# Patient Record
Sex: Female | Born: 1965
Health system: Southern US, Community
[De-identification: ages and names within clinical notes are randomized; demographics above are authoritative.]

## PROBLEM LIST (undated history)

## (undated) DIAGNOSIS — E559 Vitamin D deficiency, unspecified: Secondary | ICD-10-CM

## (undated) DIAGNOSIS — I10 Essential (primary) hypertension: Secondary | ICD-10-CM

## (undated) DIAGNOSIS — E119 Type 2 diabetes mellitus without complications: Secondary | ICD-10-CM

## (undated) DIAGNOSIS — C801 Malignant (primary) neoplasm, unspecified: Secondary | ICD-10-CM

## (undated) DIAGNOSIS — K219 Gastro-esophageal reflux disease without esophagitis: Secondary | ICD-10-CM

## (undated) DIAGNOSIS — E785 Hyperlipidemia, unspecified: Secondary | ICD-10-CM

## (undated) HISTORY — DX: Malignant (primary) neoplasm, unspecified: C80.1

## (undated) HISTORY — DX: Hyperlipidemia, unspecified: E78.5

## (undated) HISTORY — PX: COLONOSCOPY: SHX174

## (undated) HISTORY — DX: Gastro-esophageal reflux disease without esophagitis: K21.9

## (undated) HISTORY — PX: OTHER SURGICAL HISTORY: SHX169

## (undated) HISTORY — DX: Essential (primary) hypertension: I10

## (undated) HISTORY — DX: Type 2 diabetes mellitus without complications: E11.9

## (undated) HISTORY — DX: Vitamin D deficiency, unspecified: E55.9

---

## 1997-07-31 LAB — HM DIABETES EYE EXAM

## 2002-11-11 HISTORY — PX: ABDOMINAL HYSTERECTOMY: SHX81

## 2004-12-18 DIAGNOSIS — F419 Anxiety disorder, unspecified: Secondary | ICD-10-CM | POA: Insufficient documentation

## 2004-12-18 DIAGNOSIS — K219 Gastro-esophageal reflux disease without esophagitis: Secondary | ICD-10-CM | POA: Insufficient documentation

## 2004-12-18 DIAGNOSIS — F411 Generalized anxiety disorder: Secondary | ICD-10-CM | POA: Insufficient documentation

## 2005-01-23 ENCOUNTER — Ambulatory Visit: Payer: Self-pay

## 2005-04-18 ENCOUNTER — Inpatient Hospital Stay: Payer: Self-pay

## 2006-05-27 ENCOUNTER — Ambulatory Visit: Payer: Self-pay

## 2007-11-14 DIAGNOSIS — C801 Malignant (primary) neoplasm, unspecified: Secondary | ICD-10-CM

## 2007-11-14 HISTORY — DX: Malignant (primary) neoplasm, unspecified: C80.1

## 2008-01-27 DIAGNOSIS — Z720 Tobacco use: Secondary | ICD-10-CM | POA: Insufficient documentation

## 2008-02-09 DIAGNOSIS — Z8249 Family history of ischemic heart disease and other diseases of the circulatory system: Secondary | ICD-10-CM | POA: Insufficient documentation

## 2008-02-15 ENCOUNTER — Ambulatory Visit: Payer: Self-pay

## 2008-02-15 LAB — HM PAP SMEAR: HM Pap smear: NORMAL

## 2008-02-18 ENCOUNTER — Ambulatory Visit: Payer: Self-pay

## 2008-04-11 ENCOUNTER — Ambulatory Visit: Payer: Self-pay | Admitting: Radiation Oncology

## 2008-04-11 ENCOUNTER — Ambulatory Visit: Payer: Self-pay | Admitting: General Surgery

## 2008-04-13 ENCOUNTER — Ambulatory Visit: Payer: Self-pay | Admitting: General Surgery

## 2008-04-21 ENCOUNTER — Ambulatory Visit: Payer: Self-pay | Admitting: Radiation Oncology

## 2008-05-11 ENCOUNTER — Ambulatory Visit: Payer: Self-pay | Admitting: Radiation Oncology

## 2008-06-11 ENCOUNTER — Ambulatory Visit: Payer: Self-pay | Admitting: Radiation Oncology

## 2008-06-11 ENCOUNTER — Ambulatory Visit: Payer: Self-pay | Admitting: Oncology

## 2008-07-12 ENCOUNTER — Ambulatory Visit: Payer: Self-pay | Admitting: Radiation Oncology

## 2008-08-11 ENCOUNTER — Ambulatory Visit: Payer: Self-pay | Admitting: Radiation Oncology

## 2008-08-30 ENCOUNTER — Emergency Department: Payer: Self-pay | Admitting: Emergency Medicine

## 2008-11-11 HISTORY — PX: BREAST LUMPECTOMY: SHX2

## 2009-01-10 ENCOUNTER — Ambulatory Visit: Payer: Self-pay | Admitting: General Surgery

## 2009-03-11 ENCOUNTER — Ambulatory Visit: Payer: Self-pay | Admitting: Oncology

## 2009-03-15 DIAGNOSIS — E119 Type 2 diabetes mellitus without complications: Secondary | ICD-10-CM | POA: Insufficient documentation

## 2009-03-20 ENCOUNTER — Ambulatory Visit: Payer: Self-pay | Admitting: Oncology

## 2009-03-27 ENCOUNTER — Ambulatory Visit: Payer: Self-pay | Admitting: Family Medicine

## 2009-04-11 ENCOUNTER — Ambulatory Visit: Payer: Self-pay | Admitting: Oncology

## 2009-05-11 ENCOUNTER — Ambulatory Visit: Payer: Self-pay | Admitting: Oncology

## 2009-05-26 ENCOUNTER — Ambulatory Visit: Payer: Self-pay | Admitting: Obstetrics & Gynecology

## 2009-05-30 ENCOUNTER — Inpatient Hospital Stay: Payer: Self-pay | Admitting: Obstetrics & Gynecology

## 2009-07-12 ENCOUNTER — Ambulatory Visit: Payer: Self-pay | Admitting: Oncology

## 2009-08-03 ENCOUNTER — Ambulatory Visit: Payer: Self-pay | Admitting: Oncology

## 2009-08-07 DIAGNOSIS — I10 Essential (primary) hypertension: Secondary | ICD-10-CM | POA: Insufficient documentation

## 2009-08-07 DIAGNOSIS — I152 Hypertension secondary to endocrine disorders: Secondary | ICD-10-CM | POA: Insufficient documentation

## 2009-08-11 ENCOUNTER — Ambulatory Visit: Payer: Self-pay | Admitting: Oncology

## 2010-02-14 DIAGNOSIS — E559 Vitamin D deficiency, unspecified: Secondary | ICD-10-CM | POA: Insufficient documentation

## 2010-02-28 ENCOUNTER — Ambulatory Visit: Payer: Self-pay

## 2010-03-11 ENCOUNTER — Ambulatory Visit: Payer: Self-pay | Admitting: Oncology

## 2010-03-19 ENCOUNTER — Emergency Department: Payer: Self-pay | Admitting: Internal Medicine

## 2010-03-24 ENCOUNTER — Emergency Department: Payer: Self-pay | Admitting: Emergency Medicine

## 2010-03-30 ENCOUNTER — Ambulatory Visit: Payer: Self-pay | Admitting: Oncology

## 2010-04-02 ENCOUNTER — Ambulatory Visit: Payer: Self-pay | Admitting: Urology

## 2010-04-05 ENCOUNTER — Ambulatory Visit: Payer: Self-pay | Admitting: Oncology

## 2010-04-05 ENCOUNTER — Ambulatory Visit: Payer: Self-pay | Admitting: Urology

## 2010-04-11 ENCOUNTER — Ambulatory Visit: Payer: Self-pay | Admitting: Oncology

## 2010-04-16 ENCOUNTER — Ambulatory Visit: Payer: Self-pay | Admitting: Gastroenterology

## 2010-04-16 HISTORY — PX: COLONOSCOPY: SHX174

## 2010-04-16 LAB — HM COLONOSCOPY: HM Colonoscopy: NORMAL

## 2010-05-11 ENCOUNTER — Ambulatory Visit: Payer: Self-pay | Admitting: Oncology

## 2010-08-29 ENCOUNTER — Ambulatory Visit: Payer: Self-pay | Admitting: Gynecologic Oncology

## 2010-09-25 ENCOUNTER — Ambulatory Visit: Payer: Self-pay | Admitting: Oncology

## 2010-10-11 ENCOUNTER — Ambulatory Visit: Payer: Self-pay | Admitting: Oncology

## 2011-03-21 ENCOUNTER — Ambulatory Visit: Payer: Self-pay | Admitting: Family Medicine

## 2011-03-21 LAB — HM MAMMOGRAPHY: HM MAMMO: NORMAL

## 2011-05-06 ENCOUNTER — Ambulatory Visit: Payer: Self-pay | Admitting: Family Medicine

## 2012-05-18 DIAGNOSIS — Z853 Personal history of malignant neoplasm of breast: Secondary | ICD-10-CM | POA: Insufficient documentation

## 2012-05-18 DIAGNOSIS — C50919 Malignant neoplasm of unspecified site of unspecified female breast: Secondary | ICD-10-CM | POA: Insufficient documentation

## 2014-03-28 ENCOUNTER — Observation Stay: Payer: Self-pay | Admitting: Internal Medicine

## 2014-03-28 LAB — BASIC METABOLIC PANEL
ANION GAP: 9 (ref 7–16)
BUN: 14 mg/dL (ref 7–18)
Calcium, Total: 9.1 mg/dL (ref 8.5–10.1)
Chloride: 101 mmol/L (ref 98–107)
Co2: 25 mmol/L (ref 21–32)
Creatinine: 1.22 mg/dL (ref 0.60–1.30)
EGFR (African American): >60
GFR CALC NON AF AMER: 53 — AB
Glucose: 332 mg/dL — ABNORMAL HIGH (ref 65–99)
Osmolality: 284 (ref 275–301)
POTASSIUM: 4.3 mmol/L (ref 3.5–5.1)
Sodium: 135 mmol/L — ABNORMAL LOW (ref 136–145)

## 2014-03-28 LAB — HEMOGLOBIN A1C: HEMOGLOBIN A1C: 8.4 % — AB (ref 4.2–6.3)

## 2014-03-28 LAB — CK TOTAL AND CKMB (NOT AT ARMC)
CK, TOTAL: 72 U/L
CK-MB: 0.6 ng/mL (ref 0.5–3.6)

## 2014-03-28 LAB — CBC
HCT: 37.8 % (ref 35.0–47.0)
HGB: 12.4 g/dL (ref 12.0–16.0)
MCH: 26.5 pg (ref 26.0–34.0)
MCHC: 32.9 g/dL (ref 32.0–36.0)
MCV: 81 fL (ref 80–100)
Platelet: 94 10*3/uL — ABNORMAL LOW (ref 150–440)
RBC: 4.69 10*6/uL (ref 3.80–5.20)
RDW: 15.3 % — ABNORMAL HIGH (ref 11.5–14.5)
WBC: 4.9 10*3/uL (ref 3.6–11.0)

## 2014-03-28 LAB — TROPONIN I
Troponin-I: <0.02 ng/mL
Troponin-I: <0.02 ng/mL
Troponin-I: <0.02 ng/mL

## 2014-03-28 LAB — CK-MB
CK-MB: 0.7 ng/mL (ref 0.5–3.6)
CK-MB: 0.5 ng/mL (ref 0.5–3.6)

## 2014-03-28 LAB — D-DIMER(ARMC): D-Dimer: 287 ng/ml

## 2014-03-28 LAB — PRO B NATRIURETIC PEPTIDE: B-Type Natriuretic Peptide: 56 pg/mL (ref 0–125)

## 2014-03-29 DIAGNOSIS — R079 Chest pain, unspecified: Secondary | ICD-10-CM

## 2014-03-29 LAB — COMPREHENSIVE METABOLIC PANEL
ALBUMIN: 3.5 g/dL (ref 3.4–5.0)
ALK PHOS: 72 U/L
ALT: 36 U/L (ref 12–78)
ANION GAP: 4 — AB (ref 7–16)
BUN: 14 mg/dL (ref 7–18)
Bilirubin,Total: 0.3 mg/dL (ref 0.2–1.0)
CALCIUM: 8.7 mg/dL (ref 8.5–10.1)
CO2: 29 mmol/L (ref 21–32)
Chloride: 107 mmol/L (ref 98–107)
Creatinine: 1.1 mg/dL (ref 0.60–1.30)
EGFR (Non-African Amer.): >60
Glucose: 205 mg/dL — ABNORMAL HIGH (ref 65–99)
Osmolality: 286 (ref 275–301)
Potassium: 4.4 mmol/L (ref 3.5–5.1)
SGOT(AST): 30 U/L (ref 15–37)
SODIUM: 140 mmol/L (ref 136–145)
Total Protein: 6.8 g/dL (ref 6.4–8.2)

## 2014-03-29 LAB — CBC WITH DIFFERENTIAL/PLATELET
Basophil #: 0 10*3/uL (ref 0.0–0.1)
Basophil %: 0.6 %
EOS ABS: 0 10*3/uL (ref 0.0–0.7)
Eosinophil %: 0.7 %
HCT: 34.9 % — ABNORMAL LOW (ref 35.0–47.0)
HGB: 11.7 g/dL — AB (ref 12.0–16.0)
Lymphocyte #: 1.2 10*3/uL (ref 1.0–3.6)
Lymphocyte %: 30.9 %
MCH: 26.6 pg (ref 26.0–34.0)
MCHC: 33.4 g/dL (ref 32.0–36.0)
MCV: 80 fL (ref 80–100)
Monocyte #: 0.2 x10 3/mm (ref 0.2–0.9)
Monocyte %: 5.6 %
Neutrophil #: 2.5 10*3/uL (ref 1.4–6.5)
Neutrophil %: 62.2 %
PLATELETS: 87 10*3/uL — AB (ref 150–440)
RBC: 4.38 10*6/uL (ref 3.80–5.20)
RDW: 15.1 % — AB (ref 11.5–14.5)
WBC: 4 10*3/uL (ref 3.6–11.0)

## 2015-02-17 ENCOUNTER — Emergency Department
Admit: 2015-02-17 | Disposition: A | Discharge status: Home / Self Care | Payer: Self-pay | Admitting: Emergency Medicine

## 2015-02-17 LAB — URINALYSIS, COMPLETE
BACTERIA: NONE SEEN
BLOOD: NEGATIVE
Bilirubin,UR: NEGATIVE
Glucose,UR: NEGATIVE mg/dL (ref 0–75)
Ketone: NEGATIVE
Leukocyte Esterase: NEGATIVE
NITRITE: NEGATIVE
Ph: 5 (ref 4.5–8.0)
Protein: 100
SPECIFIC GRAVITY: 1.013 (ref 1.003–1.030)

## 2015-02-17 LAB — CBC WITH DIFFERENTIAL/PLATELET
BASOS ABS: 0 10*3/uL (ref 0.0–0.1)
Basophil %: 0.7 %
EOS ABS: 0 10*3/uL (ref 0.0–0.7)
Eosinophil %: 0.5 %
HCT: 36 % (ref 35.0–47.0)
HGB: 12 g/dL (ref 12.0–16.0)
Lymphocyte #: 1.1 10*3/uL (ref 1.0–3.6)
Lymphocyte %: 23.2 %
MCH: 26.8 pg (ref 26.0–34.0)
MCHC: 33.5 g/dL (ref 32.0–36.0)
MCV: 80 fL (ref 80–100)
MONO ABS: 0.2 x10 3/mm (ref 0.2–0.9)
MONOS PCT: 5.2 %
NEUTROS ABS: 3.2 10*3/uL (ref 1.4–6.5)
Neutrophil %: 70.4 %
PLATELETS: 112 10*3/uL — AB (ref 150–440)
RBC: 4.48 10*6/uL (ref 3.80–5.20)
RDW: 15.6 % — ABNORMAL HIGH (ref 11.5–14.5)
WBC: 4.6 10*3/uL (ref 3.6–11.0)

## 2015-02-17 LAB — LIPASE, BLOOD: Lipase: 55 U/L — ABNORMAL HIGH

## 2015-02-17 LAB — COMPREHENSIVE METABOLIC PANEL
AST: 29 U/L
Albumin: 4.2 g/dL
Alkaline Phosphatase: 81 U/L
Anion Gap: 6 — ABNORMAL LOW (ref 7–16)
BUN: 12 mg/dL
Bilirubin,Total: 0.4 mg/dL
Calcium, Total: 9.3 mg/dL
Chloride: 102 mmol/L
Co2: 28 mmol/L
Creatinine: 0.88 mg/dL
Glucose: 139 mg/dL — ABNORMAL HIGH
Potassium: 4.1 mmol/L
SGPT (ALT): 30 U/L
Sodium: 136 mmol/L
TOTAL PROTEIN: 7.4 g/dL

## 2015-02-17 LAB — TROPONIN I: Troponin-I: <0.03 ng/mL

## 2015-03-04 NOTE — Discharge Summary (Signed)
PATIENT NAME:  Cassandra Butler, Cassandra Butler MR#:  [id number] DATE OF BIRTH:  12/01/1965  DATE OF ADMISSION:  03/28/2014 DATE OF DISCHARGE:  03/29/2014  PRESENTING COMPLAINT: Chest pain.   DISCHARGE DIAGNOSES:  1. Chest pain, resolved.  2. Hypertension.  3. Type 2 diabetes.  4. Hyperlipidemia.  5. Myoview stress test done on May 19th reported as within normal limits. Ejection fraction of 62%. There is a small fixed defect in the apical region.  6. Cardiac enzymes x 3 negative.   MEDICATIONS AT DISCHARGE:  1. Zoloft 100 mg at bedtime.  2. Aromasin 25 mg daily.  3. Metformin 1000 mg b.i.d.  4. Fenofibrate 67 mg capsule daily.  5. Lisinopril 10 mg daily.  6. Lansoprazole 30 mg delayed release p.o. daily.  7. Pravastatin 20 mg once a day at bedtime.  8. Aspirin 81 mg daily.   DIET: Low sodium, ADA, 1800 calorie diet. Regular consistency.  No concentrated sweets.   FOLLOWUP: With Mariel Sleet at [facility]. The patient advised smoking cessation.   BRIEF SUMMARY OF HOSPITAL COURSE: This patient is a 49 year old Caucasian female with history of hypertension, diabetes, ongoing tobacco abuse, came in with: 1. Chest pain. She remained chest pain-free during my evaluation. She was continued on aspirin, beta blockers, p.r.n. nitroglycerin, heparin subcutaneously. Her stress test remained negative. Cardiac enzymes x3 remained negative.  2. Type 2 diabetes. Continue metformin.  The patient advised diet and exercise 3. Hyponatremia. Received IV fluids.  4. Hypertension. Continued patient's lisinopril and metoprolol.  5. Thrombocytopenia, stable.  6. Tobacco abuse. The patient is counseled on smoking cessation.  7. Hospital stay otherwise remained stable.   CODE STATUS: The patient remained a full code.   TIME SPENT: 40 minutes.    ____________________________ Hart Rochester Posey Pronto, MD sap:dd D: 03/31/2014 15:28:01 ET T: 03/31/2014 18:47:57 ET JOB#: [id number]  cc: Iona Stay A. Posey Pronto, MD,  <Dictator> Ilda Basset MD ELECTRONICALLY SIGNED 04/03/2014 16:23

## 2015-03-04 NOTE — H&P (Signed)
PATIENT NAME:  Cassandra Butler, Cassandra Butler MR#:  [id number] DATE OF BIRTH:  12-11-1965  DATE OF ADMISSION:  03/28/2014  PRIMARY CARE PHYSICIAN: Mariel Sleet, PA-C     HISTORY OF PRESENT ILLNESS:  The patient is a 49 year old Caucasian female with history of diabetes, hyperlipidemia, history of breast carcinoma status post lumpectomy, who presents to the hospital with complaints of chest pains. According to the patient, she was doing well up until approximately a couple of weeks ago when she started having left-sided chest pain. The patient is describing her pain in the left mid chest area which is described as stabbing, intermittent pain, increasing with deep breathing, also increasing with fluid intake such as drinking fluid or eating food. She denies any radiation of pain or aggravating or relieving factors. Admits of having some nausea, some shortness of breath as well as intermittent diaphoresis. The patient's pain has been going on for a few weeks now, however, it was worse today. On arrival to the Emergency Room, she was noted to be sinus tach at 118 beats per minute. She also was noted to have ST elevation in V1, V2 as well as ST depressions in lateral leads. Hospitalist services were contacted for admission. Patient admits of having stress test done approximately 8 to 9 years ago. At that time, it was quite normal.   PAST MEDICAL HISTORY:  Significant for history of diabetes mellitus, most recent hemoglobin A1c checked was approximately 1-1/2 months ago, was found to be 6.8. History of hyperlipidemia, never diagnosed with hypertension, however, the patient tells me that she is on ACE inhibitor due to kidney disease. Remote stress test approximately 8 to 9 years ago which was unremarkable. History of breast carcinoma on the left, status post lumpectomy, radiation therapy, status post 5 years of tamoxifen therapy, now she is on Aromasin. History of hysterectomy, gastroesophageal reflux disease, anxiety, depression.  The patient admits of having 1 functional kidney. The patient had apparently a hysterectomy done approximately 5 years ago, was evaluated by a kidney specialist due to abnormal kidney function tests and was found to have 1 functioning kidney, had some urologic procedure done, questionable due to obstructed ureter; however, urologist was not able to open up the patient's ureter. No nephrectomy was performed.   PAST SURGICAL HISTORY: As above plus hysterectomy. Partial hysterectomy initially, then the patient underwent oophorectomy as well as breast cancer surgery.   ALLERGIES: None.   MEDICATIONS: The patient is on Aromasin 25 mg p.o. daily, fenofibrate 67 mg p.o. daily, lansoprazole 30 mg p.o. daily, lisinopril 10 mg p.o. daily, metformin 1 gram twice daily, pravastatin 10 mg p.o. at bedtime, Zoloft 100 mg p.o. at bedtime.   FAMILY HISTORY: The patient's mother has a stroke whenever she was 82 years old. She also had diabetes. The patient's sister died at age of 78 of coronary artery disease, diabetes mellitus, peripheral vascular disease. Brother is healthy. The patient's dad had coronary artery disease, also bypass grafting.   SOCIAL HISTORY: The patient is married and has 2 children. Smokes more than 1 pack a day for 30 years and more, drinks alcohol occasionally socially, probably 5 times a year. She works for [employer].   REVIEW OF SYSTEMS:  CONSTITUTIONAL:  Positive for fatigue and weakness for a while now, chronic; pains in his left chest for the past 2 weeks. Bifocal glasses, increased shortness of breath. Some sinus congestion as well as postnasal drip, cough as well as intermittent wheezes and some shortness of breath with yellow,  dark-looking phlegm which seems to be chronic, also painful respirations in the left side of the chest seem to be more painful whenever she takes deep breaths. She admits of having some left-sided chest pains as mentioned above, also intermittent nausea and  constipation. Denies any high fevers or chills, weight loss or gain.  EYES:  Denies any double vision, glaucoma or cataracts.   ENT: Denies any tinnitus, allergies, epistaxis, sinus pain, dentures, difficulty swallowing. RESPIRATORY:  Denies any hemoptysis, asthma, chronic obstructive pulmonary disease.  CARDIOVASCULAR: Denies any orthopnea, edema, arrhythmias, palpitations or syncope.  GASTROINTESTINAL: Denies any vomiting, diarrhea, rectal bleeding, change in bowel habits.  GENITOURINARY: Denies dysuria, hematuria, frequency, incontinence.  ENDOCRINOLOGY:  Denies any polyuria, polydipsia, nocturia, thyroid problems, heat or cold intolerance or thirst.  HEMATOLOGIC: Denies any anemia, easy bruising or swollen glands. SKIN: Denies any acne, rashes, change in moles.  MUSCULOSKELETAL: Denies arthritis, cramps, swelling, gout.  NEUROLOGIC: No numbness, epilepsy or tremor.  PSYCHIATRIC: Denies anxiety, insomnia, depression.   PHYSICAL EXAMINATION: VITAL SIGNS: On arrival to the hospital, temperature is 98, pulse was 180, respirations were 20, blood pressure 130/60, saturation was 96% on room air.  GENERAL: On arrival to the hospital, the patient is a well-developed nourished Caucasian female, obese.  HEENT: Pupils are equal and reactive to light, extraocular movements intact.  No icterus or conjunctivitis.  Has normal hearing. No pharyngeal erythema. Mucosa is moist.  NECK: No masses. Supple, nontender. Thyroid is not enlarged. No adenopathy. No JVD or carotid bruits bilaterally, full range of motion.  LUNGS: Clear to auscultation though diminished breath sounds bilaterally. A few rales as well as rhonchi were heard. A few wheezes were heard. The patient does not have labored respirations, increased effort to breathe. Not in overt respiratory distress. She has mild tenderness on chest palpation on the left side just below her scar.  CARDIOVASCULAR: S1, S2 present. Rythm is regular.  PMI not  lateralized. Chest is nontender to palpation except as mentioned above. Normal pedal pulses. No lower extremity edema, tenderness or cyanosis was noted.  ABDOMEN: Soft, nontender. Bowel sounds are present. No hepatosplenomegaly or masses were noted.  RECTAL: Deferred.  MUSCLE STRENGTH: Able to move all extremities. No cyanosis, degenerative joint disease or kyphosis. Gait is not tested.  SKIN: Did not reveal any rashes, lesions, erythema, nodularity or induration. It was warm and dry to palpation.  LYMPHATIC: No adenopathy in the cervical region.  NEUROLOGICAL: Cranial nerves grossly intact. Sensory is intact. No dysarthria or aphasia. The patient is alert, oriented to time, person and place, cooperative. Memory is good.  PSYCHIATRIC: No significant confusion, agitation or depression noted.   LABORATORY, DIAGNOSTIC AND RADIOLOGICAL DATA:  BMP showed glucose of 332, sodium 135, otherwise BMP was unremarkable. Hemoglobin A1c 8.4. Liver enzymes were not checked. Cardiac enzymes x 1 within normal limits. White blood cell count 4.9, hemoglobin 12.4, platelet count 94. D-dimer was normal at 287. EKG sinus tach at 118 beats per minute, normal axis, possible left atrial enlargement according to EKG criteria, septal infarct with QS in leads V1 and V2, age indeterminate; ST-T elevation in V1 and V2 and depressions in lateral leads, elevation by approximately 1 mm and depression by 0.5 mm. Repeat EKG done 3 hours later revealed normal sinus rhythm at 94 beats per minute, no significant acute ST-T changes were noted. Radiologic studies: Chest x-ray, PA and lateral, Mar 28, 2014, showed no cardiopulmonary disease.   ASSESSMENT AND PLAN: 1.  Chest pain. Admit the patient to  medical floor. Started on metoprolol. Continue aspirin as well as add nitroglycerin, heparin subcutaneous. We will get stress test in the morning. The patient would benefit V/Q testing as well.  She has only 1 kidney functioning.  I would not do a  CT of her chest with contrast due to 1 kidney only. Will PPIs as well.   2.  Diabetes mellitus. We will continue metformin as well as diabetic diet and sliding scale insulin. Again, hemoglobin A1c was found to be high at 8.4. The patient would benefit from addition of glipizide.  3.  Hyponatremia. We will give IV fluids, reassess sodium level in the morning.  4.  Hypertension. We will continue metoprolol, nitroglycerin and lisinopril.  5.  Thrombocytopenia, unclear etiology at this time. We will check in the morning.  6.  Tobacco abuse. We discussed cessation for approximately 5 minutes. Nicotine replacement therapy will be initiated. She is agreeable.   TIME SPENT: 50 minutes.   ____________________________ Theodoro Grist, MD rv:cs D: 03/28/2014 18:02:00 ET T: 03/28/2014 19:19:43 ET JOB#: [id number]  cc: Mariel Sleet, PA-C Theodoro Grist, MD, <Dictator>  [place] MD ELECTRONICALLY SIGNED 05/04/2014 13:19

## 2015-04-04 DIAGNOSIS — E781 Pure hyperglyceridemia: Secondary | ICD-10-CM | POA: Insufficient documentation

## 2015-04-04 DIAGNOSIS — E1169 Type 2 diabetes mellitus with other specified complication: Secondary | ICD-10-CM | POA: Insufficient documentation

## 2015-04-04 DIAGNOSIS — C50919 Malignant neoplasm of unspecified site of unspecified female breast: Secondary | ICD-10-CM | POA: Insufficient documentation

## 2015-04-04 DIAGNOSIS — E785 Hyperlipidemia, unspecified: Secondary | ICD-10-CM | POA: Insufficient documentation

## 2015-04-04 DIAGNOSIS — G4733 Obstructive sleep apnea (adult) (pediatric): Secondary | ICD-10-CM | POA: Insufficient documentation

## 2015-04-17 ENCOUNTER — Other Ambulatory Visit: Payer: Self-pay | Admitting: Family Medicine

## 2015-04-17 DIAGNOSIS — E119 Type 2 diabetes mellitus without complications: Secondary | ICD-10-CM

## 2015-04-17 MED ORDER — METFORMIN HCL 1000 MG PO TABS: 1000.0000 mg | ORAL_TABLET | Freq: Two times a day (BID) | ORAL | Status: DC

## 2015-05-02 ENCOUNTER — Ambulatory Visit: Payer: Self-pay | Admitting: Family Medicine

## 2015-06-01 ENCOUNTER — Other Ambulatory Visit: Payer: Self-pay | Admitting: Family Medicine

## 2015-07-27 ENCOUNTER — Emergency Department: Payer: 59

## 2015-07-27 ENCOUNTER — Emergency Department
Admission: EM | Admit: 2015-07-27 | Discharge: 2015-07-27 | Disposition: A | Discharge status: Home / Self Care | Payer: 59 | Attending: Emergency Medicine | Admitting: Emergency Medicine

## 2015-07-27 ENCOUNTER — Encounter: Payer: Self-pay | Admitting: Emergency Medicine

## 2015-07-27 DIAGNOSIS — N200 Calculus of kidney: Secondary | ICD-10-CM | POA: Insufficient documentation

## 2015-07-27 DIAGNOSIS — I10 Essential (primary) hypertension: Secondary | ICD-10-CM | POA: Diagnosis not present

## 2015-07-27 DIAGNOSIS — N390 Urinary tract infection, site not specified: Secondary | ICD-10-CM | POA: Diagnosis not present

## 2015-07-27 DIAGNOSIS — Z720 Tobacco use: Secondary | ICD-10-CM | POA: Diagnosis not present

## 2015-07-27 DIAGNOSIS — Z79899 Other long term (current) drug therapy: Secondary | ICD-10-CM | POA: Insufficient documentation

## 2015-07-27 DIAGNOSIS — E119 Type 2 diabetes mellitus without complications: Secondary | ICD-10-CM | POA: Insufficient documentation

## 2015-07-27 DIAGNOSIS — R109 Unspecified abdominal pain: Secondary | ICD-10-CM | POA: Diagnosis present

## 2015-07-27 LAB — URINALYSIS COMPLETE WITH MICROSCOPIC (ARMC ONLY)
Bilirubin Urine: NEGATIVE
Glucose, UA: NEGATIVE mg/dL
KETONES UR: NEGATIVE mg/dL
Nitrite: POSITIVE — AB
PH: 5 (ref 5.0–8.0)
PROTEIN: 100 mg/dL — AB
SPECIFIC GRAVITY, URINE: 1.019 (ref 1.005–1.030)

## 2015-07-27 LAB — CBC
HCT: 37.2 % (ref 35.0–47.0)
HEMOGLOBIN: 12.5 g/dL (ref 12.0–16.0)
MCH: 27.6 pg (ref 26.0–34.0)
MCHC: 33.7 g/dL (ref 32.0–36.0)
MCV: 81.8 fL (ref 80.0–100.0)
Platelets: 93 10*3/uL — ABNORMAL LOW (ref 150–440)
RBC: 4.54 MIL/uL (ref 3.80–5.20)
RDW: 15.1 % — ABNORMAL HIGH (ref 11.5–14.5)
WBC: 7.3 10*3/uL (ref 3.6–11.0)

## 2015-07-27 LAB — COMPREHENSIVE METABOLIC PANEL
ALK PHOS: 73 U/L (ref 38–126)
ALT: 27 U/L (ref 14–54)
AST: 29 U/L (ref 15–41)
Albumin: 4.3 g/dL (ref 3.5–5.0)
Anion gap: 10 (ref 5–15)
BUN: 16 mg/dL (ref 6–20)
CALCIUM: 9.9 mg/dL (ref 8.9–10.3)
CHLORIDE: 101 mmol/L (ref 101–111)
CO2: 27 mmol/L (ref 22–32)
Creatinine, Ser: 0.94 mg/dL (ref 0.44–1.00)
GFR calc non Af Amer: >60 mL/min (ref >60)
GLUCOSE: 217 mg/dL — AB (ref 65–99)
Potassium: 4 mmol/L (ref 3.5–5.1)
SODIUM: 138 mmol/L (ref 135–145)
Total Bilirubin: 0.4 mg/dL (ref 0.3–1.2)
Total Protein: 7.6 g/dL (ref 6.5–8.1)

## 2015-07-27 LAB — LIPASE, BLOOD: Lipase: 26 U/L (ref 22–51)

## 2015-07-27 MED ORDER — CIPROFLOXACIN HCL 500 MG PO TABS: 500.0000 mg | ORAL_TABLET | Freq: Two times a day (BID) | ORAL | Status: AC

## 2015-07-27 MED ORDER — FLUCONAZOLE 100 MG PO TABS: 150.0000 mg | ORAL_TABLET | Freq: Once | ORAL | Status: AC

## 2015-07-27 MED ORDER — CIPROFLOXACIN HCL 500 MG PO TABS
500.0000 mg | ORAL_TABLET | Freq: Once | ORAL | Status: AC
  Administered 2015-07-27: 500 mg via ORAL
  Filled 2015-07-27: qty 1

## 2015-07-27 NOTE — Discharge Instructions (Signed)
Kidney Stones  Kidney stones (urolithiasis) are deposits that form inside your kidneys. The intense pain is caused by the stone moving through the urinary tract. When the stone moves, the ureter goes into spasm around the stone. The stone is usually passed in the urine.   CAUSES   · A disorder that makes certain neck glands produce too much parathyroid hormone (primary hyperparathyroidism).  · A buildup of uric acid crystals, similar to gout in your joints.  · Narrowing (stricture) of the ureter.  · A kidney obstruction present at birth (congenital obstruction).  · Previous surgery on the kidney or ureters.  · Numerous kidney infections.  SYMPTOMS   · Feeling sick to your stomach (nauseous).  · Throwing up (vomiting).  · Blood in the urine (hematuria).  · Pain that usually spreads (radiates) to the groin.  · Frequency or urgency of urination.  DIAGNOSIS   · Taking a history and physical exam.  · Blood or urine tests.  · CT scan.  · Occasionally, an examination of the inside of the urinary bladder (cystoscopy) is performed.  TREATMENT   · Observation.  · Increasing your fluid intake.  · Extracorporeal shock wave lithotripsy--This is a noninvasive procedure that uses shock waves to break up kidney stones.  · Surgery may be needed if you have severe pain or persistent obstruction. There are various surgical procedures. Most of the procedures are performed with the use of small instruments. Only small incisions are needed to accommodate these instruments, so recovery time is minimized.  The size, location, and chemical composition are all important variables that will determine the proper choice of action for you. Talk to your health care provider to better understand your situation so that you will minimize the risk of injury to yourself and your kidney.   HOME CARE INSTRUCTIONS   · Drink enough water and fluids to keep your urine clear or pale yellow. This will help you to pass the stone or stone fragments.  · Strain  all urine through the provided strainer. Keep all particulate matter and stones for your health care provider to see. The stone causing the pain may be as small as a grain of salt. It is very important to use the strainer each and every time you pass your urine. The collection of your stone will allow your health care provider to analyze it and verify that a stone has actually passed. The stone analysis will often identify what you can do to reduce the incidence of recurrences.  · Only take over-the-counter or prescription medicines for pain, discomfort, or fever as directed by your health care provider.  · Make a follow-up appointment with your health care provider as directed.  · Get follow-up X-rays if required. The absence of pain does not always mean that the stone has passed. It may have only stopped moving. If the urine remains completely obstructed, it can cause loss of kidney function or even complete destruction of the kidney. It is your responsibility to make sure X-rays and follow-ups are completed. Ultrasounds of the kidney can show blockages and the status of the kidney. Ultrasounds are not associated with any radiation and can be performed easily in a matter of minutes.  SEEK MEDICAL CARE IF:  · You experience pain that is progressive and unresponsive to any pain medicine you have been prescribed.  SEEK IMMEDIATE MEDICAL CARE IF:   · Pain cannot be controlled with the prescribed medicine.  · You have a fever or   shaking chills.  · The severity or intensity of pain increases over 18 hours and is not relieved by pain medicine.  · You develop a new onset of abdominal pain.  · You feel faint or pass out.  · You are unable to urinate.  MAKE SURE YOU:   · Understand these instructions.  · Will watch your condition.  · Will get help right away if you are not doing well or get worse.  Document Released: 10/28/2005 Document Revised: 06/30/2013 Document Reviewed: 03/31/2013  ExitCare® Patient Information ©2015  [employer]. This information is not intended to replace advice given to you by your health care provider. Make sure you discuss any questions you have with your health care provider.    Urinary Tract Infection  Urinary tract infections (UTIs) can develop anywhere along your urinary tract. Your urinary tract is your body's drainage system for removing wastes and extra water. Your urinary tract includes two kidneys, two ureters, a bladder, and a urethra. Your kidneys are a pair of bean-shaped organs. Each kidney is about the size of your fist. They are located below your ribs, one on each side of your spine.  CAUSES  Infections are caused by microbes, which are microscopic organisms, including fungi, viruses, and bacteria. These organisms are so small that they can only be seen through a microscope. Bacteria are the microbes that most commonly cause UTIs.  SYMPTOMS   Symptoms of UTIs may vary by age and gender of the patient and by the location of the infection. Symptoms in young women typically include a frequent and intense urge to urinate and a painful, burning feeling in the bladder or urethra during urination. Older women and men are more likely to be tired, shaky, and weak and have muscle aches and abdominal pain. A fever may mean the infection is in your kidneys. Other symptoms of a kidney infection include pain in your back or sides below the ribs, nausea, and vomiting.  DIAGNOSIS  To diagnose a UTI, your caregiver will ask you about your symptoms. Your caregiver also will ask to provide a urine sample. The urine sample will be tested for bacteria and white blood cells. White blood cells are made by your body to help fight infection.  TREATMENT   Typically, UTIs can be treated with medication. Because most UTIs are caused by a bacterial infection, they usually can be treated with the use of antibiotics. The choice of antibiotic and length of treatment depend on your symptoms and the type of bacteria causing  your infection.  HOME CARE INSTRUCTIONS  · If you were prescribed antibiotics, take them exactly as your caregiver instructs you. Finish the medication even if you feel better after you have only taken some of the medication.  · Drink enough water and fluids to keep your urine clear or pale yellow.  · Avoid caffeine, tea, and carbonated beverages. They tend to irritate your bladder.  · Empty your bladder often. Avoid holding urine for long periods of time.  · Empty your bladder before and after sexual intercourse.  · After a bowel movement, women should cleanse from front to back. Use each tissue only once.  SEEK MEDICAL CARE IF:   · You have back pain.  · You develop a fever.  · Your symptoms do not begin to resolve within 3 days.  SEEK IMMEDIATE MEDICAL CARE IF:   · You have severe back pain or lower abdominal pain.  · You develop chills.  · You   have nausea or vomiting.  · You have continued burning or discomfort with urination.  MAKE SURE YOU:   · Understand these instructions.  · Will watch your condition.  · Will get help right away if you are not doing well or get worse.  Document Released: 08/07/2005 Document Revised: 04/28/2012 Document Reviewed: 12/06/2011  ExitCare® Patient Information ©2015 [employer]. This information is not intended to replace advice given to you by your health care provider. Make sure you discuss any questions you have with your health care provider.

## 2015-07-27 NOTE — ED Notes (Signed)
C/o RUQ abd. Pain that radiates to right mid back area, +nausea and diarrhea, states she was told recently that she had high triglycerides and that she was at risk for pancreatitis

## 2015-07-27 NOTE — ED Provider Notes (Signed)
[facility] Emergency Department Provider Note  ____________________________________________  Time seen: 7:45 PM  I have reviewed the triage vital signs and the nursing notes.   HISTORY  Chief Complaint Abdominal Pain      HPI Cassandra Butler is a 49 y.o. female presents with right flank pain that radiates to the back times one day. Patient also admits to nausea but no vomiting. Patient states that she took 2 Profen at home with improvement of pain however pain returned at 4:00 PM this afternoon at which time she took further ibuprofen with pain resolution at this time. In addition patient admits to dysuria and hematuria     Past Medical History  Diagnosis Date  . Diabetes mellitus without complication   . GERD (gastroesophageal reflux disease)   . Hyperlipidemia   . Hypertension   . Cancer     breast   . Vitamin D deficiency     Patient Active Problem List   Diagnosis Date Noted  . Breast CA 04/04/2015  . HLD (hyperlipidemia) 04/04/2015  . Hypertriglyceridemia 04/04/2015  . Obstructive sleep apnea of adult 04/04/2015  . Avitaminosis D 02/14/2010  . Essential (primary) hypertension 08/07/2009  . Diabetes 03/15/2009  . Family history of cardiovascular disease 02/09/2008  . Current tobacco use 01/27/2008  . Anxiety disorder 12/18/2004  . Acid reflux 12/18/2004    Past Surgical History  Procedure Laterality Date  . Abdominal hysterectomy  2004    total, due to fibriods. Also had ovaries removed after 1 year of breast cancer.     Current Outpatient Rx  Name  Route  Sig  Dispense  Refill  . exemestane (AROMASIN) 25 MG tablet   Oral   Take by mouth daily.         Marland Kitchen glipiZIDE (GLUCOTROL) 5 MG tablet   Oral   Take by mouth daily.         Marland Kitchen lisinopril (PRINIVIL,ZESTRIL) 10 MG tablet      TAKE 1 TABLET BY MOUTH EVERY DAY   90 tablet   0   . metFORMIN (GLUCOPHAGE) 1000 MG tablet   Oral   Take 1 tablet (1,000 mg total) by mouth  2 (two) times daily.   180 tablet   1   . omeprazole (PRILOSEC) 20 MG capsule      TAKE ONE CAPSULE BY MOUTH EVERY DAY   90 capsule   0   . Omeprazole 20 MG TBEC   Oral   Take by mouth daily.         . pravastatin (PRAVACHOL) 80 MG tablet   Oral   Take by mouth daily.         . sertraline (ZOLOFT) 100 MG tablet   Oral   Take by mouth daily.           Allergies Review of patient's allergies indicates no known allergies.  No family history on file.  Social History Social History  Substance Use Topics  . Smoking status: Current Every Day Smoker -- 1.00 packs/day    Types: Cigarettes  . Smokeless tobacco: None  . Alcohol Use: No    Review of Systems  Constitutional: Negative for fever. Eyes: Negative for visual changes. ENT: Negative for sore throat. Cardiovascular: Negative for chest pain. Respiratory: Negative for shortness of breath. Gastrointestinal: Positive for right flank and nausea Genitourinary: Negative for dysuria. Musculoskeletal: Negative for back pain. Skin: Negative for rash. Neurological: Negative for headaches, focal weakness or numbness.    10-point  ROS otherwise negative.  ____________________________________________   PHYSICAL EXAM:  VITAL SIGNS: ED Triage Vitals  Enc Vitals Group     BP 07/27/15 1700 110/62 mmHg     Pulse Rate 07/27/15 1700 93     Resp --      Temp 07/27/15 1700 98.2 F (36.8 C)     Temp Source 07/27/15 1700 Oral     SpO2 07/27/15 1700 98 %     Weight 07/27/15 1700 180 lb (81.647 kg)     Height 07/27/15 1700 5\' 6"  (1.676 m)     Head Cir --      Peak Flow --      Pain Score 07/27/15 1700 5     Pain Loc --      Pain Edu? --      Excl. in [place]? --     Constitutional: Alert and oriented. Well appearing and in no distress. Eyes: Conjunctivae are normal. PERRL. Normal extraocular movements. ENT   Head: Normocephalic and atraumatic.   Nose: No congestion/rhinnorhea.   Mouth/Throat: Mucous  membranes are moist.   Neck: No stridor. Cardiovascular: Normal rate, regular rhythm. Normal and symmetric distal pulses are present in all extremities. No murmurs, rubs, or gallops. Respiratory: Normal respiratory effort without tachypnea nor retractions. Breath sounds are clear and equal bilaterally. No wheezes/rales/rhonchi. Gastrointestinal: Soft and nontender. No distention. There is no CVA tenderness. Genitourinary: deferred Musculoskeletal: Nontender with normal range of motion in all extremities. No joint effusions.  No lower extremity tenderness nor edema. Neurologic:  Normal speech and language. No gross focal neurologic deficits are appreciated. Speech is normal.  Skin:  Skin is warm, dry and intact. No rash noted. Psychiatric: Mood and affect are normal. Speech and behavior are normal. Patient exhibits appropriate insight and judgment.  ____________________________________________    LABS (pertinent positives/negatives)  Labs Reviewed  COMPREHENSIVE METABOLIC PANEL - Abnormal; Notable for the following:    Glucose, Bld 217 (*)    All other components within normal limits  CBC - Abnormal; Notable for the following:    RDW 15.1 (*)    Platelets 93 (*)    All other components within normal limits  URINALYSIS COMPLETEWITH MICROSCOPIC ([facility] ONLY) - Abnormal; Notable for the following:    Color, Urine YELLOW (*)    APPearance CLOUDY (*)    Hgb urine dipstick 3+ (*)    Protein, ur 100 (*)    Nitrite POSITIVE (*)    Leukocytes, UA 3+ (*)    Bacteria, UA RARE (*)    Squamous Epithelial / LPF 6-30 (*)    All other components within normal limits  LIPASE, BLOOD         RADIOLOGY     CT RENAL STONE STUDY (Final result) Result time: 07/27/15 21:05:46   Final result by Rad Results In Interface (07/27/15 21:05:46)   Narrative:   CLINICAL DATA: Right flank region pain with nausea and vomiting  EXAM: CT ABDOMEN AND PELVIS WITHOUT  CONTRAST  TECHNIQUE: Multidetector CT imaging of the abdomen and pelvis was performed following the standard protocol without oral or intravenous contrast material administration.  COMPARISON: Mar 25, 2010  FINDINGS: Lung bases are clear. There are small coronary artery calcifications.  Liver is enlarged measuring 24.1 cm in length. There is hepatic steatosis. No focal liver lesions are identified on this noncontrast enhanced study. Gallbladder wall is not thickened. There is no biliary duct dilatation.  Spleen remains enlarged, measuring 16.3 x 15.3 x 9.1 cm. No focal splenic  lesions are identified. No varices appreciated.  Pancreas and adrenals appear normal.  The left kidney remains diffusely atrophic. There is marked hydronephrosis on the left. There is a calculus bearing within hydronephrosis on the left measuring 6 x 5 mm. There is a calculus in the left ureter at the level of L4-5 measuring 5 x 4 mm, best seen on axial slice 56 series 2. Ureter is not seen beyond this level. It is possible that this area represents a calculus in the distal most aspect of a markedly enlarged renal pelvis with a degree of underlying ureteropelvic junction obstruction. There is no renal mass on the left.  On the right, there is no mass or hydronephrosis. There is no right-sided renal or ureteral calculus.  In the pelvis, the urinary bladder is midline. Uterus is absent. There is no pelvic mass or pelvic fluid collection. The appendix appears normal.  No bowel obstruction. No free air or portal venous air. There is no ascites, adenopathy, or abscess in the abdomen pelvis. There is atherosclerotic change in aorta but no aneurysm. There are no blastic or lytic bone lesions.  IMPRESSION: Marked atrophy of the left kidney with marked dilatation of the collecting system. There is a calculus in the dependent portion of this dilated collecting system as well as a calculus seen at  the level of L4-5, either and the proximal ureter or along the distal aspect of a marked ureteropelvic junction obstruction. No ureter is appreciable distal to this calculus.  On the right, there is no hydronephrosis or ureteral calculus. No renal calculus on the right. A cause for the right flank pain has not been established with this study.  There is hepatic megaly and splenomegaly with hepatic steatosis. No focal liver or splenic lesions.  No bowel obstruction. No abscess. Appendix appears normal.  There are small foci of coronary artery calcification noted.   Electronically Signed By: Lowella Grip III M.D. On: 07/27/2015 21:05       INITIAL IMPRESSION / ASSESSMENT AND PLAN / ED COURSE  Pertinent labs & imaging results that were available during my care of the patient were reviewed by me and considered in my medical decision making (see chart for details).  I'm performed the patient will clinical findings including CT scan results. Patient informed me that she was told that "my left kidney does not work and the tube is twisted". Patient afebrile at this time with no leukocytosis. I will prescribe antibiotics for ciprofloxacin 500 mg for urinary tract infection. Patient discussed with Dr. Jeffie Pollock urologist on-call and I will refer patient to follow-up with. ____________________________________________   FINAL CLINICAL IMPRESSION(S) / ED DIAGNOSES  Final diagnoses:  Right flank pain  UTI (lower urinary tract infection)  Kidney stone on left side      Gregor Hams, MD 07/27/15 2131

## 2015-08-07 ENCOUNTER — Telehealth: Payer: Self-pay | Admitting: Family Medicine

## 2015-08-07 NOTE — Telephone Encounter (Signed)
New patient appointment scheduled/MW

## 2015-08-07 NOTE — Telephone Encounter (Signed)
New patient request, please review. KW

## 2015-08-07 NOTE — Telephone Encounter (Signed)
Approval for new patient

## 2015-08-07 NOTE — Telephone Encounter (Signed)
Pt is asking if you will accept her daughter,  Gevena Mart 08/14/1984 as a new patient.  Pt has been being seen at [facility] and she is wanting a new PCP.  No Rx.  [employer].  Pt is having problems with anxiety.  CB# for Pamala Hurry [phone]/GB

## 2015-08-07 NOTE — Telephone Encounter (Signed)
[place] for 45 minute appointment to get established and work on her presenting problem.

## 2015-08-30 ENCOUNTER — Other Ambulatory Visit: Payer: Self-pay | Admitting: Family Medicine

## 2015-09-19 ENCOUNTER — Other Ambulatory Visit: Payer: Self-pay | Admitting: Family Medicine

## 2015-10-01 ENCOUNTER — Other Ambulatory Visit: Payer: Self-pay | Admitting: Family Medicine

## 2015-10-17 ENCOUNTER — Other Ambulatory Visit: Payer: Self-pay | Admitting: Family Medicine

## 2015-12-02 ENCOUNTER — Other Ambulatory Visit: Payer: Self-pay | Admitting: Family Medicine

## 2015-12-04 ENCOUNTER — Other Ambulatory Visit: Payer: Self-pay | Admitting: Family Medicine

## 2015-12-11 ENCOUNTER — Other Ambulatory Visit: Payer: Self-pay | Admitting: Family Medicine

## 2015-12-11 ENCOUNTER — Telehealth: Payer: Self-pay | Admitting: Family Medicine

## 2015-12-11 NOTE — Telephone Encounter (Signed)
Spoke with patient on phone and advised as below, patient is out of town but will schedule appt in Feb. KW

## 2015-12-11 NOTE — Telephone Encounter (Signed)
Does patient need OV? Please advise. Thanks!

## 2015-12-11 NOTE — Telephone Encounter (Signed)
Need to f/u on your chronic medical problems. We can discuss smoking cessation at that time.

## 2015-12-11 NOTE — Telephone Encounter (Signed)
Pt called wanting to start chantix.  Could someone please call her back.  Pt [phone]  Thanks Con Memos

## 2016-01-02 ENCOUNTER — Other Ambulatory Visit: Payer: Self-pay | Admitting: Family Medicine

## 2016-01-02 ENCOUNTER — Ambulatory Visit: Payer: Self-pay | Admitting: Family Medicine

## 2016-01-09 ENCOUNTER — Ambulatory Visit: Payer: Self-pay | Admitting: Family Medicine

## 2016-01-23 ENCOUNTER — Other Ambulatory Visit: Payer: Self-pay | Admitting: Family Medicine

## 2016-01-26 ENCOUNTER — Other Ambulatory Visit: Payer: Self-pay | Admitting: Family Medicine

## 2016-01-26 ENCOUNTER — Telehealth: Payer: Self-pay | Admitting: Family Medicine

## 2016-01-26 NOTE — Telephone Encounter (Signed)
Pt called about her Metformin prescription.  I advised her she needed to make an appointment before any refills can be sent in.  At first she did agree to make an appointment; but then stated she needed her "Metformin in the next five minutes" because she "Is at the pharmacy now and will be going out of town within the hour."  I advised her that I would sent a message back but I could not guarantee her medication would be filled in five minutes.  She then asked if it was okay to go over a week without Metformin.  I informed her that was not a question we could answer without knowing her current blood sugars and A1C.  (Her last A1C was 8.1 on 01/31/2015)  I advised her that she needs to be seen ever three months for a follow up and she told me "I know that, but my insurance has been messed up."  She then told me that she was "going to have to leave and will call around for a new provider next week."  ** Update**  While typing this message I was informed that the patient had made an appointment with you on Thursday and the pharmacist ([facility], [place]) gave her enough Metformin until then.  She states that "The Pharmacist can't believe her provider would hold diabetes medicines from her and that was not right."    Thanks,   -Mickel Baas

## 2016-02-01 ENCOUNTER — Encounter: Payer: Self-pay | Admitting: Family Medicine

## 2016-02-01 ENCOUNTER — Ambulatory Visit (INDEPENDENT_AMBULATORY_CARE_PROVIDER_SITE_OTHER): Payer: BLUE CROSS/BLUE SHIELD | Admitting: Family Medicine

## 2016-02-01 VITALS — BP 102/82 | HR 105 | Temp 98.2°F | Resp 17 | Wt 180.2 lb

## 2016-02-01 DIAGNOSIS — E1165 Type 2 diabetes mellitus with hyperglycemia: Secondary | ICD-10-CM

## 2016-02-01 DIAGNOSIS — E781 Pure hyperglyceridemia: Secondary | ICD-10-CM

## 2016-02-01 DIAGNOSIS — I10 Essential (primary) hypertension: Secondary | ICD-10-CM | POA: Diagnosis not present

## 2016-02-01 DIAGNOSIS — J0100 Acute maxillary sinusitis, unspecified: Secondary | ICD-10-CM | POA: Diagnosis not present

## 2016-02-01 LAB — POCT GLYCOSYLATED HEMOGLOBIN (HGB A1C): HEMOGLOBIN A1C: 9.1

## 2016-02-01 MED ORDER — GLIPIZIDE 5 MG PO TABS: ORAL_TABLET | ORAL | Status: DC

## 2016-02-01 MED ORDER — AMOXICILLIN-POT CLAVULANATE 875-125 MG PO TABS: 1.0000 | ORAL_TABLET | Freq: Two times a day (BID) | ORAL | Status: DC

## 2016-02-01 MED ORDER — ATORVASTATIN CALCIUM 80 MG PO TABS: 80.0000 mg | ORAL_TABLET | Freq: Every day | ORAL | Status: DC

## 2016-02-01 MED ORDER — METFORMIN HCL 1000 MG PO TABS: 1000.0000 mg | ORAL_TABLET | Freq: Two times a day (BID) | ORAL | Status: DC

## 2016-02-01 MED ORDER — BLOOD GLUCOSE MONITOR KIT: PACK | Status: DC

## 2016-02-01 NOTE — Progress Notes (Signed)
Subjective:     Patient ID: Cassandra Butler, female   DOB: 1966-02-16, 50 y.o.   MRN: [id number]  HPI  Chief Complaint  Patient presents with  . Diabetes    Patient comes in office today for follow up from 01/31/15. Patients HgbA1C in house at visit was 8.1%, patient was started on Glipizide 12m. Patient reports poor compliance and tolerance, she states that when she was on Glipizide her blood sugar dropped and d/c three days after being prescribed medication  . Hyperlipidemia    Follow up from 01/31/15 patient was advised to continue Pravastatin. Patient reports good compliance and tolerance on medication  . Hypertension    Follow up from 01/31/15, patient blood pressure in house at visit was 124/82. Patient was advised to continue Lisinopril, she reports good compliance and tolerance on medication  . Abnormal Lab    Patient reports that she had a Biometric screen through her employeer, patients Lipid panel showed a Triglycerides at 1367. Patients manager is requesting patient have lab addressed.   . Facial Pain    Patient reports sinus pain and pressure for the past 4 weeks.   Here today after a year's hiatus in f/u of her medical problems. Reports persistent sinus pressure and purulent drainage. + tobacco @ 1 ppd. Last refill for pravastatin requested and filled a lower dose for unknown reasons.   Review of Systems  Psychiatric/Behavioral:       Sertraline controlling anxiety       Objective:   Physical Exam  Constitutional: She appears well-developed and well-nourished. No distress.  Ears: T.M's intact without inflammation Sinuses: moderate maxillary and paranasal sinus tenderness Throat: no tonsillar enlargement or exudate Neck: no cervical adenopathy Lungs: clear     Assessment:    1. Type 2 diabetes mellitus with hyperglycemia, without long-term current use of insulin (HCC) - glipiZIDE (GLUCOTROL) 5 MG tablet; 1/2 pill before breakfast and supper  Dispense: 30 tablet;  Refill: 2 - metFORMIN (GLUCOPHAGE) 1000 MG tablet; Take 1 tablet (1,000 mg total) by mouth 2 (two) times daily with a meal.  Dispense: 180 tablet; Refill: 0 - POCT glycosylated hemoglobin (Hb A1C) - blood glucose meter kit and supplies KIT; Check sugar daily  Dispense: 1 each; Refill: 0  2. Hypertriglyceridemia - atorvastatin (LIPITOR) 80 MG tablet; Take 1 tablet (80 mg total) by mouth daily.  Dispense: 30 tablet; Refill: 2  3. Essential (primary) hypertension  4. Acute maxillary sinusitis, recurrence not specified - amoxicillin-clavulanate (AUGMENTIN) 875-125 MG tablet; Take 1 tablet by mouth 2 (two) times daily.  Dispense: 20 tablet; Refill: 0    Plan:    Add Mucinex D. Return in 4 weeks to check labs and f/u diabetes.

## 2016-02-01 NOTE — Patient Instructions (Addendum)
Add Mucinex D for your sinuses. At next office visit we will recheck your sugar and cholesterol.

## 2016-02-12 ENCOUNTER — Telehealth: Payer: Self-pay | Admitting: Family Medicine

## 2016-02-12 ENCOUNTER — Other Ambulatory Visit: Payer: Self-pay | Admitting: Family Medicine

## 2016-02-12 DIAGNOSIS — N760 Acute vaginitis: Secondary | ICD-10-CM

## 2016-02-12 MED ORDER — FLUCONAZOLE 150 MG PO TABS: 150.0000 mg | ORAL_TABLET | Freq: Once | ORAL | Status: DC

## 2016-02-12 NOTE — Telephone Encounter (Signed)
Pt stated that she was taking amoxicillin-clavulanate (AUGMENTIN) 875-125 MG tablet and she thinks it has caused a yeast infection. Pt stated that she has tried OTC and it helped but didn't clear it completely. Pt would like something in the form of a pill sent to [place]. Please advise. Thanks TNP

## 2016-02-12 NOTE — Telephone Encounter (Signed)
Patient has been advised. KW 

## 2016-02-12 NOTE — Telephone Encounter (Signed)
Please review. KW 

## 2016-02-12 NOTE — Telephone Encounter (Signed)
Diflucan sent in

## 2016-02-29 ENCOUNTER — Encounter: Payer: Self-pay | Admitting: Family Medicine

## 2016-02-29 ENCOUNTER — Ambulatory Visit (INDEPENDENT_AMBULATORY_CARE_PROVIDER_SITE_OTHER): Payer: BLUE CROSS/BLUE SHIELD | Admitting: Family Medicine

## 2016-02-29 VITALS — BP 122/64 | HR 66 | Temp 98.4°F | Resp 16 | Wt 175.8 lb

## 2016-02-29 DIAGNOSIS — E781 Pure hyperglyceridemia: Secondary | ICD-10-CM | POA: Diagnosis not present

## 2016-02-29 DIAGNOSIS — E1165 Type 2 diabetes mellitus with hyperglycemia: Secondary | ICD-10-CM | POA: Diagnosis not present

## 2016-02-29 MED ORDER — GLIPIZIDE 5 MG PO TABS: ORAL_TABLET | ORAL | Status: DC

## 2016-02-29 NOTE — Patient Instructions (Signed)
We will call you with the lab results. You may increase your glipizide to 10 mg. To achieve fasting sugars  < 140

## 2016-02-29 NOTE — Progress Notes (Signed)
Subjective:     Patient ID: Cassandra Butler, female   DOB: 1966-03-22, 50 y.o.   MRN: [id number]  HPI  Chief Complaint  Patient presents with  . Diabetes    Patient is present in office for 4 week follow up, last office visit was 02/01/16. Patient was advised at last visit to continue Glipizide 5mg  and Metformin 1000mg , in house HgbA1C was 9.1%. Patient reports that her fasting blood sugars have been ranging from 212-240, lowest reading was 149. Patient reports good compliance and tolerance on medication, she denies any hyperglycemia incidents.   She has increased PM glipizide to 5 mg.but fasting sugar remains above 200. Reports tolerance of atorvastatin. States she has been cutting out junk food when she watches tv.   Review of Systems  Constitutional:       Sees her oncologist annually  Respiratory: Positive for shortness of breath (attributed ot smoking).        Not using C-Pap as insurance would not cover  Cardiovascular: Negative for chest pain and palpitations.       Objective:   Physical Exam  Constitutional: She appears well-developed and well-nourished. No distress.  Cardiovascular: Normal rate and regular rhythm.   Pulmonary/Chest: Breath sounds normal.  Musculoskeletal: She exhibits no edema (of lower extremities).       Assessment:    1. Hypertriglyceridemia - Lipid panel  2. Type 2 diabetes mellitus with hyperglycemia, without long-term current use of insulin (HCC) - glipiZIDE (GLUCOTROL) 5 MG tablet; one pill before breakfast and supper  Dispense: 60 tablet; Refill: 2 - Comprehensive metabolic panel    Plan:    She may increase glipizide as needed to 10 mg.prior to next appointment. We will call her with lab results.

## 2016-03-01 ENCOUNTER — Other Ambulatory Visit: Payer: Self-pay | Admitting: Family Medicine

## 2016-03-01 ENCOUNTER — Telehealth: Payer: Self-pay

## 2016-03-01 LAB — COMPREHENSIVE METABOLIC PANEL
A/G RATIO: 1.6 (ref 1.2–2.2)
ALT: 32 IU/L (ref 0–32)
AST: 27 IU/L (ref 0–40)
Albumin: 4.4 g/dL (ref 3.5–5.5)
Alkaline Phosphatase: 108 IU/L (ref 39–117)
BILIRUBIN TOTAL: 0.4 mg/dL (ref 0.0–1.2)
BUN / CREAT RATIO: 13 (ref 9–23)
BUN: 10 mg/dL (ref 6–24)
CHLORIDE: 101 mmol/L (ref 96–106)
CO2: 23 mmol/L (ref 18–29)
Calcium: 9.6 mg/dL (ref 8.7–10.2)
Creatinine, Ser: 0.8 mg/dL (ref 0.57–1.00)
GFR calc non Af Amer: 87 mL/min/{1.73_m2} (ref >59)
GFR, EST AFRICAN AMERICAN: 101 mL/min/{1.73_m2} (ref >59)
GLOBULIN, TOTAL: 2.7 g/dL (ref 1.5–4.5)
Glucose: 209 mg/dL — ABNORMAL HIGH (ref 65–99)
POTASSIUM: 4.6 mmol/L (ref 3.5–5.2)
SODIUM: 141 mmol/L (ref 134–144)
TOTAL PROTEIN: 7.1 g/dL (ref 6.0–8.5)

## 2016-03-01 LAB — LIPID PANEL
CHOL/HDL RATIO: 4.8 ratio — AB (ref 0.0–4.4)
Cholesterol, Total: 129 mg/dL (ref 100–199)
HDL: 27 mg/dL — AB (ref >39)
Triglycerides: 437 mg/dL — ABNORMAL HIGH (ref 0–149)

## 2016-03-01 NOTE — Telephone Encounter (Signed)
Pt informed and voiced understanding of results. 

## 2016-03-01 NOTE — Telephone Encounter (Signed)
[id number] 

## 2016-03-01 NOTE — Telephone Encounter (Signed)
-----   Message from Carmon Ginsberg, [place] sent at 03/01/2016  7:42 AM EDT ----- Labs look good (except for sugar as you know)  With triglycerides at 435. Continue atorvastatin.

## 2016-03-06 ENCOUNTER — Other Ambulatory Visit: Payer: Self-pay | Admitting: Family Medicine

## 2016-05-21 ENCOUNTER — Other Ambulatory Visit: Payer: Self-pay | Admitting: Family Medicine

## 2016-05-30 ENCOUNTER — Ambulatory Visit: Payer: BLUE CROSS/BLUE SHIELD | Admitting: Family Medicine

## 2016-06-01 ENCOUNTER — Other Ambulatory Visit: Payer: Self-pay | Admitting: Family Medicine

## 2016-07-03 DIAGNOSIS — E119 Type 2 diabetes mellitus without complications: Secondary | ICD-10-CM | POA: Diagnosis not present

## 2016-07-08 ENCOUNTER — Other Ambulatory Visit: Payer: Self-pay | Admitting: Family Medicine

## 2016-07-08 NOTE — Telephone Encounter (Signed)
Refill request. KW 

## 2016-07-25 ENCOUNTER — Other Ambulatory Visit: Payer: Self-pay | Admitting: Family Medicine

## 2016-07-25 DIAGNOSIS — E1165 Type 2 diabetes mellitus with hyperglycemia: Secondary | ICD-10-CM

## 2016-08-28 ENCOUNTER — Other Ambulatory Visit: Payer: Self-pay | Admitting: Family Medicine

## 2016-10-08 DIAGNOSIS — Z79811 Long term (current) use of aromatase inhibitors: Secondary | ICD-10-CM | POA: Diagnosis not present

## 2016-10-08 DIAGNOSIS — C50412 Malignant neoplasm of upper-outer quadrant of left female breast: Secondary | ICD-10-CM | POA: Diagnosis not present

## 2016-10-08 DIAGNOSIS — Z170 Estrogen receptor positive status [ER+]: Secondary | ICD-10-CM | POA: Diagnosis not present

## 2016-10-08 DIAGNOSIS — M8589 Other specified disorders of bone density and structure, multiple sites: Secondary | ICD-10-CM | POA: Insufficient documentation

## 2016-10-08 DIAGNOSIS — F17200 Nicotine dependence, unspecified, uncomplicated: Secondary | ICD-10-CM | POA: Diagnosis not present

## 2016-10-08 DIAGNOSIS — Z1231 Encounter for screening mammogram for malignant neoplasm of breast: Secondary | ICD-10-CM | POA: Diagnosis not present

## 2016-10-15 DIAGNOSIS — M8589 Other specified disorders of bone density and structure, multiple sites: Secondary | ICD-10-CM | POA: Diagnosis not present

## 2016-10-15 DIAGNOSIS — Z79811 Long term (current) use of aromatase inhibitors: Secondary | ICD-10-CM | POA: Diagnosis not present

## 2016-10-15 DIAGNOSIS — Z170 Estrogen receptor positive status [ER+]: Secondary | ICD-10-CM | POA: Diagnosis not present

## 2016-10-15 DIAGNOSIS — C50412 Malignant neoplasm of upper-outer quadrant of left female breast: Secondary | ICD-10-CM | POA: Diagnosis not present

## 2016-10-27 ENCOUNTER — Other Ambulatory Visit: Payer: Self-pay | Admitting: Family Medicine

## 2016-10-27 DIAGNOSIS — E1165 Type 2 diabetes mellitus with hyperglycemia: Secondary | ICD-10-CM

## 2016-11-29 ENCOUNTER — Other Ambulatory Visit: Payer: Self-pay | Admitting: Family Medicine

## 2016-12-03 ENCOUNTER — Ambulatory Visit (INDEPENDENT_AMBULATORY_CARE_PROVIDER_SITE_OTHER): Payer: BLUE CROSS/BLUE SHIELD | Admitting: Family Medicine

## 2016-12-03 VITALS — BP 130/78 | HR 112 | Temp 98.2°F | Resp 16 | Wt 181.2 lb

## 2016-12-03 DIAGNOSIS — E1165 Type 2 diabetes mellitus with hyperglycemia: Secondary | ICD-10-CM | POA: Diagnosis not present

## 2016-12-03 DIAGNOSIS — K6289 Other specified diseases of anus and rectum: Secondary | ICD-10-CM

## 2016-12-03 MED ORDER — GLIPIZIDE 10 MG PO TABS: 10.0000 mg | ORAL_TABLET | Freq: Two times a day (BID) | ORAL | 2 refills | Status: DC

## 2016-12-03 MED ORDER — HYDROCORTISONE ACE-PRAMOXINE 2.5-1 % RE CREA: 1.0000 "application " | TOPICAL_CREAM | Freq: Three times a day (TID) | RECTAL | 1 refills | Status: DC

## 2016-12-03 NOTE — Patient Instructions (Addendum)
Decrease metformin to 1/2 pill twice daily. Glipizide has been increased to 10 mg. Daily. Call me in one to two weeks if not improving. Try daily bathtub soaks for 5-10 minutes.

## 2016-12-03 NOTE — Progress Notes (Signed)
Subjective:     Patient ID: Cassandra Butler, female   DOB: Sep 04, 1966, 51 y.o.   MRN: [id number]  HPI  Chief Complaint  Patient presents with  . Hemorrhoids    Patient comes in office today with concerns on internal hemorrhoids for the past 4 weeks. Patient states that she has rectal pain, itching and bleeding. Patient denies constipation, she states that she has used otc suppositories and cream with no relief.  States prior to the onset of her symptoms she was having 7 loose bowel movements daily which she attributes to the metformin. Occasionally will see BRB on her toilet tissue.When calcium supplement was added bowels became formed but frequency remained the same. 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. Hx of normal colonoscopy in 2011. States she has been taking 5-10 mg.of glipizide twice but prescription ran out and she was only using a few days/week.   Review of Systems     Objective:   Physical Exam  Constitutional: She appears well-developed and well-nourished. No distress.  Genitourinary:  Genitourinary Comments: Mild perianal erythema with specific area of tenderness in the 7:00 position. External skin tag but no distinct anal tears noted. Digital exam not attempted due to pain.       Assessment:    1. Type 2 diabetes mellitus with hyperglycemia, without long-term current use of insulin (HCC) - glipiZIDE (GLUCOTROL) 10 MG tablet; Take 1 tablet (10 mg total) by mouth 2 (two) times daily before a meal.  Dispense: 60 tablet; Refill: 2  2. Rectal pain: ? Anal tear ? Irritation due to frequent bowel movements - hydrocortisone-pramoxine (ANALPRAM HC) 2.5-1 % rectal cream; Place 1 application rectally 3 (three) times daily.  Dispense: 30 g; Refill: 1    Plan:    Decrease metformin, discussed bathtub soaks. Phone f/u in 1-2 weeks.

## 2016-12-04 ENCOUNTER — Other Ambulatory Visit: Payer: Self-pay | Admitting: Family Medicine

## 2016-12-25 ENCOUNTER — Encounter: Payer: Self-pay | Admitting: *Deleted

## 2016-12-25 ENCOUNTER — Telehealth: Payer: Self-pay

## 2016-12-25 DIAGNOSIS — K644 Residual hemorrhoidal skin tags: Secondary | ICD-10-CM

## 2016-12-25 NOTE — Telephone Encounter (Signed)
That's not any other prescription creams that work differently. Recommend referral to surgery, to consider excising hemorrhoid. She can also try OTC witch hazel until she can get in with surgeon. OTC ibuprofen 3.x 200mg  every six hours may help alleviate pain as well.

## 2016-12-25 NOTE — Telephone Encounter (Signed)
Patient is requesting Mikki Santee or Bob's nurse to return her call. She reports hemorrhoids are no better even after using the hydrocortisone-pramoxine (ANALPRAM HC) 2.5-1 % rectal cream prescribed at last OV.  CB#[phone]

## 2016-12-25 NOTE — Telephone Encounter (Signed)
Please schedule referral to general surgery. Thanks!

## 2016-12-25 NOTE — Telephone Encounter (Signed)
Patient was notified. Patient is agreeable to surgery referral.

## 2016-12-25 NOTE — Telephone Encounter (Signed)
Spoke with patient on the phone who reports rectal pain ad external hemorrhoids. Patient was last seen in office on 12/03/16 but at time of examine there was no visible hemorrhoids seen. patient was prescribed Analpram HC 2.5-1% cream and reports that it has not helped at all to relieve symptoms. Patient states that she has rectal bleeding when wiping and now has become constipated due to her Metformin. Patient states that she has tried otc hemorrhoid suppositories but it has caused rectal burning when inserting. Patient wants to know if something can be called into pharmacy. I advised patient that Mikki Santee was out of the office today and message would be forward to another provider who is in office today. KW

## 2017-01-06 ENCOUNTER — Other Ambulatory Visit: Payer: Self-pay | Admitting: Family Medicine

## 2017-01-14 ENCOUNTER — Encounter: Payer: Self-pay | Admitting: General Surgery

## 2017-01-14 ENCOUNTER — Ambulatory Visit (INDEPENDENT_AMBULATORY_CARE_PROVIDER_SITE_OTHER): Payer: BLUE CROSS/BLUE SHIELD | Admitting: General Surgery

## 2017-01-14 VITALS — BP 120/68 | HR 84 | Resp 12 | Ht 66.0 in | Wt 179.0 lb

## 2017-01-14 DIAGNOSIS — K602 Anal fissure, unspecified: Secondary | ICD-10-CM | POA: Diagnosis not present

## 2017-01-14 NOTE — Patient Instructions (Addendum)
The patient is aware to call back for any questions or concerns.   Give patient three samples of anal pram . Patient to call in two weeks and report how things are going. Needs colonoscopy.

## 2017-01-14 NOTE — Progress Notes (Addendum)
Patient ID: Cassandra Butler, female   DOB: 12/26/1965, 51 y.o.   MRN: [id number]  Chief Complaint  Patient presents with  . Rectal Problems    HPI Cassandra Butler is a 51 y.o. female.  Here today for evaluation of hemorrhoids. She states that for about 9 weeks she has been noticing some rectal bleeding but not with every BM. Her bowels move 3 times a day. She states she is in the car a lot riding with her job and notices some pain. She states she has some rectal pain and is using ibuprofen( 8-12 tab/day). She has tried analpram cream and OTC suppositories. She is followed by [facility]. HPI  Past Medical History:  Diagnosis Date  . Cancer ([place]) 11/14/2007   Left breast wide excision, sentinel node biopsy. 1.4 cm, T1c, N0: ER 90%; PR 905; Her 2 neu not over expressed.   . Diabetes mellitus without complication ([place])   . GERD (gastroesophageal reflux disease)   . Hyperlipidemia   . Hypertension   . Vitamin D deficiency     Past Surgical History:  Procedure Laterality Date  . ABDOMINAL HYSTERECTOMY  2004   total, due to fibriods. Also had ovaries removed after 1 year of breast cancer.   Marland Kitchen BREAST LUMPECTOMY Left 2010    Family History  Problem Relation Age of Onset  . Diabetes Mother     Social History Social History  Substance Use Topics  . Smoking status: Current Every Day Smoker    Packs/day: 1.00    Types: Cigarettes  . Smokeless tobacco: Never Used  . Alcohol use No    No Known Allergies  Current Outpatient Prescriptions  Medication Sig Dispense Refill  . atorvastatin (LIPITOR) 80 MG tablet TAKE 1 TABLET(80 MG) BY MOUTH DAILY 90 tablet 2  . BAYER CONTOUR NEXT TEST test strip CHECK SUGAR D  0  . blood glucose meter kit and supplies KIT Check sugar daily 1 each 0  . Calcium Carb-Cholecalciferol (CALCIUM 1000 + D PO) Take by mouth.    . Cholecalciferol (VITAMIN D3) 2000 units TABS Take by mouth.    Marland Kitchen exemestane (AROMASIN) 25 MG tablet Take by mouth daily.     Marland Kitchen glipiZIDE (GLUCOTROL) 10 MG tablet Take 1 tablet (10 mg total) by mouth 2 (two) times daily before a meal. 60 tablet 2  . ibuprofen (ADVIL,MOTRIN) 200 MG tablet Take 200 mg by mouth every 6 (six) hours as needed.    Marland Kitchen lisinopril (PRINIVIL,ZESTRIL) 10 MG tablet TAKE 1 TABLET BY MOUTH EVERY DAY 90 tablet 3  . metFORMIN (GLUCOPHAGE) 1000 MG tablet TAKE 1 TABLET BY MOUTH TWICE DAILY WITH A MEAL 180 tablet 0  . MICROLET LANCETS MISC CHECK SUGAR D  0  . omeprazole (PRILOSEC) 20 MG capsule TAKE ONE CAPSULE BY MOUTH EVERY DAY 90 capsule 0  . sertraline (ZOLOFT) 100 MG tablet TAKE 1 TABLET BY MOUTH EVERY DAY 90 tablet 1  . hydrocortisone-pramoxine (ANALPRAM HC) 2.5-1 % rectal cream Place 1 application rectally 3 (three) times daily. (Patient not taking: Reported on 01/14/2017) 30 g 1   No current facility-administered medications for this visit.     Review of Systems Review of Systems  Constitutional: Negative.   Respiratory: Negative.   Cardiovascular: Negative.   Gastrointestinal: Negative.  Negative for anal bleeding.    Blood pressure 120/68, pulse 84, resp. rate 12, height '5\' 6"'  (1.676 m), weight 179 lb (81.2 kg).  Physical Exam Physical Exam  Constitutional: She is  oriented to person, place, and time. She appears well-developed and well-nourished.  Eyes: Conjunctivae are normal. No scleral icterus.  Neck: Neck supple.  Cardiovascular: Normal rate, regular rhythm and normal heart sounds.   Pulses:      Dorsalis pedis pulses are 2+ on the right side, and 2+ on the left side.       Posterior tibial pulses are 2+ on the right side, and 2+ on the left side.  Pulmonary/Chest: Effort normal and breath sounds normal.  Abdominal: Soft. Normal appearance and bowel sounds are normal. There is no tenderness.  Lymphadenopathy:    She has no cervical adenopathy.  Neurological: She is alert and oriented to person, place, and time.  Skin: Skin is warm and dry.    Data Reviewed External anal  exam showed a sentinel pile posteriorly and a small fissure above this.  Assessment    Likely anal-rectal source for bleeding secondary to anal fissure.  Need for colonoscopy.    Plan   We'll see her response to the use of Analpram cream. Samples provided.    Patient to call in two weeks and report how things are going. Needs colonoscopy.   This information has been scribed by Karie Fetch RN, BSN,BC.   Robert Bellow 01/15/2017, 2:56 PM

## 2017-01-15 ENCOUNTER — Encounter: Payer: Self-pay | Admitting: General Surgery

## 2017-01-15 DIAGNOSIS — K602 Anal fissure, unspecified: Secondary | ICD-10-CM | POA: Insufficient documentation

## 2017-01-27 ENCOUNTER — Emergency Department: Payer: BLUE CROSS/BLUE SHIELD

## 2017-01-27 ENCOUNTER — Emergency Department
Admission: EM | Admit: 2017-01-27 | Discharge: 2017-01-28 | Disposition: A | Discharge status: Home / Self Care | Payer: BLUE CROSS/BLUE SHIELD | Attending: Emergency Medicine | Admitting: Emergency Medicine

## 2017-01-27 DIAGNOSIS — Z791 Long term (current) use of non-steroidal anti-inflammatories (NSAID): Secondary | ICD-10-CM | POA: Diagnosis not present

## 2017-01-27 DIAGNOSIS — Z7984 Long term (current) use of oral hypoglycemic drugs: Secondary | ICD-10-CM | POA: Insufficient documentation

## 2017-01-27 DIAGNOSIS — R791 Abnormal coagulation profile: Secondary | ICD-10-CM | POA: Diagnosis not present

## 2017-01-27 DIAGNOSIS — F17210 Nicotine dependence, cigarettes, uncomplicated: Secondary | ICD-10-CM | POA: Insufficient documentation

## 2017-01-27 DIAGNOSIS — I10 Essential (primary) hypertension: Secondary | ICD-10-CM | POA: Diagnosis not present

## 2017-01-27 DIAGNOSIS — H538 Other visual disturbances: Secondary | ICD-10-CM | POA: Diagnosis not present

## 2017-01-27 DIAGNOSIS — E119 Type 2 diabetes mellitus without complications: Secondary | ICD-10-CM | POA: Insufficient documentation

## 2017-01-27 DIAGNOSIS — R51 Headache: Secondary | ICD-10-CM | POA: Diagnosis not present

## 2017-01-27 DIAGNOSIS — R519 Headache, unspecified: Secondary | ICD-10-CM

## 2017-01-27 LAB — CBC
HCT: 38.9 % (ref 35.0–47.0)
HEMOGLOBIN: 13 g/dL (ref 12.0–16.0)
MCH: 27.2 pg (ref 26.0–34.0)
MCHC: 33.4 g/dL (ref 32.0–36.0)
MCV: 81.4 fL (ref 80.0–100.0)
PLATELETS: 72 10*3/uL — AB (ref 150–440)
RBC: 4.77 MIL/uL (ref 3.80–5.20)
RDW: 14.8 % — ABNORMAL HIGH (ref 11.5–14.5)
WBC: 4.4 10*3/uL (ref 3.6–11.0)

## 2017-01-27 LAB — GLUCOSE, CAPILLARY: GLUCOSE-CAPILLARY: 194 mg/dL — AB (ref 65–99)

## 2017-01-27 LAB — DIFFERENTIAL
Basophils Absolute: 0 10*3/uL (ref 0–0.1)
Basophils Relative: 0 %
Eosinophils Absolute: 0 10*3/uL (ref 0–0.7)
Eosinophils Relative: 0 %
LYMPHS PCT: 7 %
Lymphs Abs: 0.3 10*3/uL — ABNORMAL LOW (ref 1.0–3.6)
Monocytes Absolute: 0.2 10*3/uL (ref 0.2–0.9)
Monocytes Relative: 5 %
NEUTROS ABS: 3.9 10*3/uL (ref 1.4–6.5)
NEUTROS PCT: 88 %

## 2017-01-27 LAB — COMPREHENSIVE METABOLIC PANEL
ALBUMIN: 4.3 g/dL (ref 3.5–5.0)
ALK PHOS: 72 U/L (ref 38–126)
ALT: 37 U/L (ref 14–54)
AST: 34 U/L (ref 15–41)
Anion gap: 7 (ref 5–15)
BILIRUBIN TOTAL: 0.7 mg/dL (ref 0.3–1.2)
BUN: 20 mg/dL (ref 6–20)
CALCIUM: 9.2 mg/dL (ref 8.9–10.3)
CO2: 25 mmol/L (ref 22–32)
CREATININE: 1.02 mg/dL — AB (ref 0.44–1.00)
Chloride: 103 mmol/L (ref 101–111)
GFR calc Af Amer: >60 mL/min (ref >60)
GFR calc non Af Amer: >60 mL/min (ref >60)
GLUCOSE: 197 mg/dL — AB (ref 65–99)
Potassium: 4.3 mmol/L (ref 3.5–5.1)
SODIUM: 135 mmol/L (ref 135–145)
TOTAL PROTEIN: 7.7 g/dL (ref 6.5–8.1)

## 2017-01-27 LAB — PROTIME-INR
INR: 1.07
PROTHROMBIN TIME: 13.9 s (ref 11.4–15.2)

## 2017-01-27 LAB — TROPONIN I: Troponin I: <0.03 ng/mL (ref <0.03)

## 2017-01-27 LAB — APTT: aPTT: 26 seconds (ref 24–36)

## 2017-01-27 MED ORDER — KETOROLAC TROMETHAMINE 30 MG/ML IJ SOLN
30.0000 mg | Freq: Once | INTRAMUSCULAR | Status: AC
  Administered 2017-01-27: 30 mg via INTRAVENOUS
  Filled 2017-01-27: qty 1

## 2017-01-27 MED ORDER — ONDANSETRON HCL 4 MG/2ML IJ SOLN
4.0000 mg | Freq: Once | INTRAMUSCULAR | Status: AC
  Administered 2017-01-27: 4 mg via INTRAVENOUS
  Filled 2017-01-27: qty 2

## 2017-01-27 MED ORDER — SODIUM CHLORIDE 0.9 % IV BOLUS (SEPSIS)
1000.0000 mL | Freq: Once | INTRAVENOUS | Status: AC
  Administered 2017-01-27: 1000 mL via INTRAVENOUS

## 2017-01-27 NOTE — ED Triage Notes (Signed)
Pt reports sudden onset blurred vision and neck pain. Pt also reports that she felt like her speech was stuttering. Pt checked her CBG and got 207. Pt states last time she remembers feeling normal was 1500 and symptoms began at 1510. Pt reports symptoms resolved during triage.

## 2017-01-27 NOTE — ED Notes (Signed)
Spoke with Dr. Clearnce Hasten. Orders received to complete stroke work-up but do not call Code Stroke.

## 2017-01-27 NOTE — ED Notes (Signed)
Pt states was driving and headache began with blurry vision and she had to pull over. States by the time she arrived her the blurry vision and headache had stopped but has pain in back of neck. Denies hx of same, denies hx of migraines or dizziness.

## 2017-01-28 NOTE — ED Provider Notes (Signed)
[facility] Emergency Department Provider Note   ____________________________________________   First MD Initiated Contact with Patient 01/27/17 2056     (approximate)  I have reviewed the triage vital signs and the nursing notes.   HISTORY  Chief Complaint Blurred Vision and Headache   HPI Cassandra Butler is a 51 y.o. female with a history of diabetes as well as breast cancer in remission who is presenting to the emergency department with sudden onset of blurred vision and headache. She says that she was driving home today when she suddenly experienced blurred vision to both eyes. She says that she had to pull over about 6 times on the side of the road before she reached the hospital. She says that upon arrival to the hospital the blurred vision resolved. However, she says that she has persistent 3 out of 10 pain to the back of her head as well as severe pain radiating down her neck. She denies any injury but says she may overuse her neck and back lately as she has been painting. She denies any weakness or numbness. Said that she did have dizziness during the episode when she was having blurry vision but says that this is resolved this point. Does not have any migraine headache history. Denies any pain over the bilateral temples.   Past Medical History:  Diagnosis Date  . Cancer ([place]) 11/14/2007   Left breast wide excision, sentinel node biopsy. 1.4 cm, T1c, N0: ER 90%; PR 905; Her 2 neu not over expressed.   . Diabetes mellitus without complication ([place])   . GERD (gastroesophageal reflux disease)   . Hyperlipidemia   . Hypertension   . Vitamin D deficiency     Patient Active Problem List   Diagnosis Date Noted  . Anal fissure 01/15/2017  . Osteopenia of multiple sites 10/08/2016  . Aromatase inhibitor use 10/08/2016  . Type 2 diabetes mellitus with hyperglycemia ([place]) 02/01/2016  . Breast CA ([place]) 04/04/2015  . HLD (hyperlipidemia) 04/04/2015  .  Hypertriglyceridemia 04/04/2015  . Obstructive sleep apnea of adult 04/04/2015  . Malignant neoplasm of breast ([place]) 05/18/2012  . Avitaminosis D 02/14/2010  . Essential (primary) hypertension 08/07/2009  . Family history of cardiovascular disease 02/09/2008  . Current tobacco use 01/27/2008  . Anxiety disorder 12/18/2004  . Acid reflux 12/18/2004    Past Surgical History:  Procedure Laterality Date  . ABDOMINAL HYSTERECTOMY  2004   total, due to fibriods. Also had ovaries removed after 1 year of breast cancer.   Marland Kitchen BREAST LUMPECTOMY Left 2010    Prior to Admission medications   Medication Sig Start Date End Date Taking? Authorizing Provider  atorvastatin (LIPITOR) 80 MG tablet TAKE 1 TABLET(80 MG) BY MOUTH DAILY 05/21/16   Carmon Ginsberg, PA  BAYER CONTOUR NEXT TEST test strip CHECK SUGAR D 02/01/16   Historical Provider, MD  blood glucose meter kit and supplies KIT Check sugar daily 02/01/16   Carmon Ginsberg, PA  Calcium Carb-Cholecalciferol (CALCIUM 1000 + D PO) Take by mouth.    Historical Provider, MD  Cholecalciferol (VITAMIN D3) 2000 units TABS Take by mouth.    Historical Provider, MD  exemestane (AROMASIN) 25 MG tablet Take by mouth daily. 05/24/13   Historical Provider, MD  glipiZIDE (GLUCOTROL) 10 MG tablet Take 1 tablet (10 mg total) by mouth 2 (two) times daily before a meal. 12/03/16   Carmon Ginsberg, PA  hydrocortisone-pramoxine [facility]) 2.5-1 % rectal cream Place 1 application rectally 3 (three) times  daily. Patient not taking: Reported on 01/14/2017 12/03/16   Carmon Ginsberg, PA  ibuprofen (ADVIL,MOTRIN) 200 MG tablet Take 200 mg by mouth every 6 (six) hours as needed.    Historical Provider, MD  lisinopril (PRINIVIL,ZESTRIL) 10 MG tablet TAKE 1 TABLET BY MOUTH EVERY DAY 03/06/16   Carmon Ginsberg, PA  metFORMIN (GLUCOPHAGE) 1000 MG tablet TAKE 1 TABLET BY MOUTH TWICE DAILY WITH A MEAL 10/28/16   Carmon Ginsberg, PA  [facility] LANCETS MISC CHECK SUGAR D 02/01/16   Historical  Provider, MD  omeprazole (PRILOSEC) 20 MG capsule TAKE ONE CAPSULE BY MOUTH EVERY DAY 11/29/16   Carmon Ginsberg, PA  sertraline (ZOLOFT) 100 MG tablet TAKE 1 TABLET BY MOUTH EVERY DAY 01/06/17   Carmon Ginsberg, PA    Allergies Patient has no known allergies.  Family History  Problem Relation Age of Onset  . Diabetes Mother     Social History Social History  Substance Use Topics  . Smoking status: Current Every Day Smoker    Packs/day: 1.00    Types: Cigarettes  . Smokeless tobacco: Never Used  . Alcohol use No    Review of Systems Constitutional: No fever/chills Eyes: No visual changes. ENT: No sore throat. Cardiovascular: Denies chest pain. Respiratory: Denies shortness of breath. Gastrointestinal: No abdominal pain.  no vomiting.  No diarrhea.  No constipation. Genitourinary: Negative for dysuria. Musculoskeletal: Negative for back pain. Skin: Negative for rash. Neurological: Negative for focal weakness or numbness.  10-point ROS otherwise negative.  ____________________________________________   PHYSICAL EXAM:  VITAL SIGNS: ED Triage Vitals [01/27/17 1722]  Enc Vitals Group     BP 122/69     Pulse Rate (!) 108     Resp 18     Temp 97.8 F (36.6 C)     Temp Source Oral     SpO2 98 %     Weight 181 lb (82.1 kg)     Height _0  (1.676 m)     Head Circumference      Peak Flow      Pain Score      Pain Loc      Pain Edu?      Excl. in [place]?     Constitutional: Alert and oriented. Well appearing and in no acute distress. Eyes: Conjunctivae are normal. PERRL. EOMI.No nystagmus. Head: Atraumatic. Nose: No congestion/rhinnorhea. Mouth/Throat: Mucous membranes are moist.   Neck: No stridor.  No tenderness to the bilateral trapezius muscles. She says that it feels improved when I palpate the neck bilaterally. Cardiovascular: Normal rate, regular rhythm. Grossly normal heart sounds.   Respiratory: Normal respiratory effort.  No retractions. Lungs  CTAB. Gastrointestinal: Soft and nontender. No distention.  Musculoskeletal: No lower extremity tenderness nor edema.  No joint effusions. Neurologic:  Normal speech and language. No gross focal neurologic deficits are appreciated.  Skin:  Skin is warm, dry and intact. No rash noted. Psychiatric: Mood and affect are normal. Speech and behavior are normal.  ____________________________________________   LABS (all labs ordered are listed, but only abnormal results are displayed)  Labs Reviewed  GLUCOSE, CAPILLARY - Abnormal; Notable for the following:       Result Value   Glucose-Capillary 194 (*)    All other components within normal limits  CBC - Abnormal; Notable for the following:    RDW 14.8 (*)    Platelets 72 (*)    All other components within normal limits  DIFFERENTIAL - Abnormal; Notable for the following:    Lymphs  Abs 0.3 (*)    All other components within normal limits  COMPREHENSIVE METABOLIC PANEL - Abnormal; Notable for the following:    Glucose, Bld 197 (*)    Creatinine, Ser 1.02 (*)    All other components within normal limits  PROTIME-INR  APTT  TROPONIN I  CBG MONITORING, ED   ____________________________________________  EKG  ED ECG REPORT I, Doran Stabler, the attending physician, personally viewed and interpreted this ECG.   Date: 01/28/2017  EKG Time: 1729  Rate: 92  Rhythm: sinus rhythmwith sinus arrhythmia  Axis: normal  Intervals:none  ST&T Change: no ST segment elevation or depression. Biphasic T waves in V5 and V6.  ED ECG REPORT I, Doran Stabler, the attending physician, personally viewed and interpreted this ECG.   Date: 01/28/2017  EKG Time: 2240  Rate: 114  Rhythm: sinus arrhythmia with sinus tachycardia.  Axis: normal  Intervals:none  ST&T Change: no ST segment elevation or depression.Biphasic T waves again visualized in V5 and V6.  Similar appearance to the T waves  Of  07/27/2015. ____________________________________________  RADIOLOGY  CT Head Wo Contrast (Final result)  Result time 01/27/17 17:50:02  Final result by Sharyn Blitz, MD (01/27/17 17:50:02)           Narrative:   CLINICAL DATA: Blurry vision. Headache for 3 days. Neck pain. History of left breast cancer. No reported injury.  EXAM: CT HEAD WITHOUT CONTRAST  TECHNIQUE: Contiguous axial images were obtained from the base of the skull through the vertex without intravenous contrast.  COMPARISON: Report from 05/10/1997 head CT (images not available).  FINDINGS: Brain: The cerebellar tonsils extend inferiorly to the level of the foramen magnum. No evidence of parenchymal hemorrhage or extra-axial fluid collection. No mass lesion, mass effect, or midline shift. No CT evidence of acute infarction. Cerebral volume is age appropriate. No ventriculomegaly.  Vascular: No hyperdense vessel or unexpected calcification.  Skull: No evidence of calvarial fracture.  Sinuses/Orbits: The visualized paranasal sinuses are essentially clear.  Other: The mastoid air cells are unopacified.  IMPRESSION: 1. No evidence of acute intracranial abnormality. No hydrocephalus. 2. Inferior extension of the cerebellar tonsils to the level of the foramen magnum. Consider correlation with MRI brain without and with IV contrast to evaluate for a Chiari I malformation, as clinically warranted.   Electronically Signed By: Ilona Sorrel M.D. On: 01/27/2017 17:50            ____________________________________________   PROCEDURES  Procedure(s) performed:   Procedures  Critical Care performed:   ____________________________________________   INITIAL IMPRESSION / ASSESSMENT AND PLAN / ED COURSE  Pertinent labs & imaging results that were available during my care of the patient were reviewed by me and considered in my medical decision making (see chart for  details).  ----------------------------------------- 12:29 AM on 01/28/2017 -----------------------------------------  Patient now tachycardic but says that she feels completely relieved of all of her symptoms. Looking back on the last several visits to her primary care doctor she has been mildly tachycardic. She says that she does not feel her heart tracing. Unclear cause of the tachycardia but completely asymptomatic now and appears to be a somewhat chronic issue. She'll be discharged home. We discussed her diagnosis as well as need for follow-up for the Chiari malformation finding found on her head CT. She was given a copy of the CAT scan report to take with her to her primary care doctor 0.9. The patient's understanding of the plan and willing  to comply. Headache      ____________________________________________   FINAL CLINICAL IMPRESSION(S) / ED DIAGNOSES  Final diagnoses:  Acute nonintractable headache, unspecified headache type  Blurred vision      NEW MEDICATIONS STARTED DURING THIS VISIT:  Discharge Medication List as of 01/28/2017 12:19 AM       Note:  This document was prepared using Dragon voice recognition software and may include unintentional dictation errors.    Orbie Pyo, MD 01/28/17 Lupita Shutter

## 2017-02-04 ENCOUNTER — Telehealth: Payer: Self-pay | Admitting: Family Medicine

## 2017-02-05 ENCOUNTER — Encounter: Payer: Self-pay | Admitting: Family Medicine

## 2017-02-05 ENCOUNTER — Encounter: Payer: Self-pay | Admitting: General Surgery

## 2017-02-05 ENCOUNTER — Ambulatory Visit (INDEPENDENT_AMBULATORY_CARE_PROVIDER_SITE_OTHER): Payer: BLUE CROSS/BLUE SHIELD | Admitting: Family Medicine

## 2017-02-05 VITALS — BP 134/82 | HR 89 | Temp 98.7°F | Resp 16 | Wt 184.0 lb

## 2017-02-05 DIAGNOSIS — G8929 Other chronic pain: Secondary | ICD-10-CM

## 2017-02-05 DIAGNOSIS — Z800 Family history of malignant neoplasm of digestive organs: Secondary | ICD-10-CM | POA: Diagnosis not present

## 2017-02-05 DIAGNOSIS — Z1211 Encounter for screening for malignant neoplasm of colon: Secondary | ICD-10-CM | POA: Diagnosis not present

## 2017-02-05 DIAGNOSIS — R51 Headache: Secondary | ICD-10-CM | POA: Diagnosis not present

## 2017-02-05 DIAGNOSIS — R9402 Abnormal brain scan: Secondary | ICD-10-CM

## 2017-02-05 DIAGNOSIS — R519 Headache, unspecified: Secondary | ICD-10-CM

## 2017-02-05 NOTE — Patient Instructions (Signed)
We will call you about the scheduling of the tests.

## 2017-02-05 NOTE — Progress Notes (Signed)
Subjective:     Patient ID: Cassandra Butler, female   DOB: August 25, 1966, 51 y.o.   MRN: [id number]  HPI  Chief Complaint  Patient presents with  . Follow-up    Patient returns back to office for hospital follow up, patient was seen at [facility] ER on 01/27/17 with complaints of blurred vision and headache. Patient reports that day of hospital visit [place] and had to pull over 5x times because she was unable to see. Patient ECG in hospital was abnormal and she had elevated glucose level, no changes to medication list. Patient reports 3/23 she noticed slight improvement but still waking up with headache and neck pain every morning.    Describes posterior headache as dull and throbbing with transient relief from ibuprofen. Reports sugars are running in the 200's at times. Currently on glipizide twice daily and metformin once daily due to diarrhea. CCT at ER was concerning for Chiari I malformation and further evaluation with MRI recommended. Also patient reports Dr. Bary Castilla recommended she have a colonoscopy. Has a paternal hx of colon cancer.   Review of Systems     Objective:   Physical Exam  Constitutional: She appears well-developed and well-nourished. No distress.       Assessment:    1. Chronic intractable headache, unspecified headache type - MR Brain W Wo Contrast; Future  2. Abnormal brain scan - MR Brain W Wo Contrast; Future  3. Screen for colon cancer - Ambulatory referral to General Surgery  4. Family history of colon cancer - Ambulatory referral to General Surgery    Plan:    Further f/u pending MRI result. Diabetes f/u pending next month.

## 2017-02-10 ENCOUNTER — Ambulatory Visit
Admission: RE | Admit: 2017-02-10 | Discharge: 2017-02-10 | Disposition: A | Discharge status: Home / Self Care | Payer: BLUE CROSS/BLUE SHIELD | Source: Ambulatory Visit | Attending: Family Medicine | Admitting: Family Medicine

## 2017-02-10 DIAGNOSIS — R9402 Abnormal brain scan: Secondary | ICD-10-CM | POA: Diagnosis not present

## 2017-02-10 DIAGNOSIS — G8929 Other chronic pain: Secondary | ICD-10-CM

## 2017-02-10 DIAGNOSIS — R519 Headache, unspecified: Secondary | ICD-10-CM

## 2017-02-10 DIAGNOSIS — R51 Headache: Secondary | ICD-10-CM | POA: Diagnosis not present

## 2017-02-10 MED ORDER — GADOBENATE DIMEGLUMINE 529 MG/ML IV SOLN
20.0000 mL | Freq: Once | INTRAVENOUS | Status: AC | PRN
  Administered 2017-02-10: 17 mL via INTRAVENOUS

## 2017-02-10 NOTE — Telephone Encounter (Signed)
-----   Message from Carmon Ginsberg, [place] sent at 02/10/2017 12:22 PM EDT ----- MRI scan ok. If still having headaches do you wish referral to neurology?

## 2017-02-10 NOTE — Telephone Encounter (Signed)
Patient was advised of MRI, she declined referral and stated that she believed she suffered from tension headaches and Ibuprofen has been working for her. KW

## 2017-02-26 ENCOUNTER — Other Ambulatory Visit: Payer: Self-pay | Admitting: Family Medicine

## 2017-02-27 ENCOUNTER — Ambulatory Visit: Payer: BLUE CROSS/BLUE SHIELD | Admitting: General Surgery

## 2017-03-04 ENCOUNTER — Encounter: Payer: Self-pay | Admitting: *Deleted

## 2017-03-04 ENCOUNTER — Ambulatory Visit: Payer: BLUE CROSS/BLUE SHIELD | Admitting: Family Medicine

## 2017-03-11 ENCOUNTER — Ambulatory Visit (INDEPENDENT_AMBULATORY_CARE_PROVIDER_SITE_OTHER): Payer: BLUE CROSS/BLUE SHIELD | Admitting: General Surgery

## 2017-03-11 ENCOUNTER — Encounter: Payer: Self-pay | Admitting: General Surgery

## 2017-03-11 NOTE — Patient Instructions (Signed)
The patient is aware to call back for any questions or concerns.  

## 2017-03-11 NOTE — Progress Notes (Signed)
Patient ID: Cassandra Butler, female   DOB: [birth year], 51 y.o.   MRN: [id number]  Chief Complaint  Patient presents with  . Colonoscopy    HPI Cassandra Butler is a 51 y.o. female.  Who presents for a colonoscopy discussion. The last colonoscopy was completed 04-16-10 by Dr Candace Cruise. Denies any gastrointestinal issues. Bowels move regular and no bleeding noted.  HPI  Past Medical History:  Diagnosis Date  . Cancer ([place]) 11/14/2007   Left breast wide excision, sentinel node biopsy. 1.4 cm, T1c, N0: ER 90%; PR 905; Her 2 neu not over expressed.   . Diabetes mellitus without complication ([place])   . GERD (gastroesophageal reflux disease)   . Hyperlipidemia   . Hypertension   . Vitamin D deficiency     Past Surgical History:  Procedure Laterality Date  . ABDOMINAL HYSTERECTOMY  2004   total, due to fibriods. Also had ovaries removed after 1 year of breast cancer.   Marland Kitchen BREAST LUMPECTOMY Left 2010  . COLONOSCOPY     25 yrs ago  . COLONOSCOPY  04/16/2010   Dr Candace Cruise    Family History  Problem Relation Age of Onset  . Diabetes Mother     Social History Social History  Substance Use Topics  . Smoking status: Current Every Day Smoker    Packs/day: 1.00    Types: Cigarettes  . Smokeless tobacco: Never Used  . Alcohol use No    No Known Allergies  Current Outpatient Prescriptions  Medication Sig Dispense Refill  . atorvastatin (LIPITOR) 80 MG tablet TAKE 1 TABLET(80 MG) BY MOUTH DAILY 90 tablet 0  . BAYER CONTOUR NEXT TEST test strip CHECK SUGAR D  0  . blood glucose meter kit and supplies KIT Check sugar daily 1 each 0  . Calcium Carb-Cholecalciferol (CALCIUM 1000 + D PO) Take by mouth.    . Cholecalciferol (VITAMIN D3) 2000 units TABS Take by mouth.    Marland Kitchen exemestane (AROMASIN) 25 MG tablet Take by mouth daily.    Marland Kitchen glipiZIDE (GLUCOTROL) 10 MG tablet Take 1 tablet (10 mg total) by mouth 2 (two) times daily before a meal. 60 tablet 2  . hydrocortisone-pramoxine (ANALPRAM HC) 2.5-1 %  rectal cream Place 1 application rectally 3 (three) times daily. (Patient not taking: Reported on 02/05/2017) 30 g 1  . ibuprofen (ADVIL,MOTRIN) 200 MG tablet Take 200 mg by mouth every 6 (six) hours as needed.    Marland Kitchen lisinopril (PRINIVIL,ZESTRIL) 10 MG tablet TAKE 1 TABLET BY MOUTH EVERY DAY 90 tablet 3  . metFORMIN (GLUCOPHAGE) 1000 MG tablet TAKE 1 TABLET BY MOUTH TWICE DAILY WITH A MEAL 180 tablet 0  . MICROLET LANCETS MISC CHECK SUGAR D  0  . Omega-3 Fatty Acids (FISH OIL) 1000 MG CAPS Take by mouth daily.    Marland Kitchen omeprazole (PRILOSEC) 20 MG capsule TAKE ONE CAPSULE BY MOUTH EVERY DAY 90 capsule 0  . sertraline (ZOLOFT) 100 MG tablet TAKE 1 TABLET BY MOUTH EVERY DAY 90 tablet 1   No current facility-administered medications for this visit.     Review of Systems Review of Systems  Constitutional: Negative.   Respiratory: Negative.   Cardiovascular: Negative.   Gastrointestinal: Negative.     Blood pressure 128/74, pulse (!) 106, resp. rate 12, height _0  (1.676 m), weight 175 lb (79.4 kg).  Physical Exam Physical Exam  Constitutional: She is oriented to person, place, and time. She appears well-developed and well-nourished.  Neurological: She is alert and oriented to  person, place, and time.  Skin: Skin is warm and dry.  Psychiatric: Her behavior is normal.    Data Reviewed Normal colonoscopy in 2011. Follow up in 10 years recommended.  Assessment    No active GI problems.    Plan         Follow up colonoscopy in 3 years.  HPI, Physical Exam, Assessment and Plan have been scribed under the direction and in the presence of Robert Bellow, MD.  Karie Fetch, RN  I have completed the exam and reviewed the above documentation for accuracy and completeness.  I agree with the above.  Haematologist has been used and any errors in dictation or transcription are unintentional.  Hervey Ard, M.D., F.A.C.S.   Karie Fetch M 03/11/2017, 10:39 AM

## 2017-03-24 ENCOUNTER — Ambulatory Visit (INDEPENDENT_AMBULATORY_CARE_PROVIDER_SITE_OTHER): Payer: BLUE CROSS/BLUE SHIELD | Admitting: Family Medicine

## 2017-03-24 ENCOUNTER — Encounter: Payer: Self-pay | Admitting: Family Medicine

## 2017-03-24 VITALS — BP 120/76 | HR 91 | Temp 98.5°F | Resp 16 | Wt 175.4 lb

## 2017-03-24 DIAGNOSIS — E1165 Type 2 diabetes mellitus with hyperglycemia: Secondary | ICD-10-CM | POA: Diagnosis not present

## 2017-03-24 DIAGNOSIS — I10 Essential (primary) hypertension: Secondary | ICD-10-CM

## 2017-03-24 DIAGNOSIS — E781 Pure hyperglyceridemia: Secondary | ICD-10-CM | POA: Diagnosis not present

## 2017-03-24 LAB — POCT GLYCOSYLATED HEMOGLOBIN (HGB A1C): Hemoglobin A1C: 9

## 2017-03-24 LAB — HEMOGLOBIN A1C: Hemoglobin A1C: 9

## 2017-03-24 MED ORDER — METFORMIN HCL 500 MG PO TABS: 500.0000 mg | ORAL_TABLET | Freq: Two times a day (BID) | ORAL | 3 refills | Status: DC

## 2017-03-24 NOTE — Patient Instructions (Signed)
We will call you with the lab results. Do update you diabetic eye exam. Remember to take your glipizide 30 minutes before a meal.

## 2017-03-24 NOTE — Progress Notes (Signed)
Subjective:     Patient ID: Cassandra Butler, female   DOB: April 26, 1966, 51 y.o.   MRN: [id number]  HPI  Chief Complaint  Patient presents with  . Diabetes    Patient returns to office today for 3 month follow up, last office visit was 12/03/16. At [place] office visit we increased Glipizide down to 10mg  and decreased Metformin so 1/2 pill BID. Patient reports good compliance, tolerance and symptom control on medication. Patient reports she is working on a healthier lifestyle and exercise, she denies any hyperglycemia incidents.   States diarrhea has resolved on lower dose of metformin. Reports she last had an eye exam about a year ago. Currently not taking glipizide correctly. States she will take her AM pill with just coffee and the evening pill before bedtime.   Review of Systems  Respiratory: Negative for shortness of breath.        Does not wish to quit smoking at this time.  Cardiovascular: Negative for chest pain and palpitations.       Objective:   Physical Exam  Constitutional: She appears well-developed and well-nourished. No distress.  Lungs: clear Heart: RRR without murmur Lower extremities: no edema; pedal pulses intact, sensation to monofilament intact, no wounds noted.     Assessment:    1. Type 2 diabetes mellitus with hyperglycemia, without long-term current use of insulin (HCC) - POCT glycosylated hemoglobin (Hb A1C) - metFORMIN (GLUCOPHAGE) 500 MG tablet; Take 1 tablet (500 mg total) by mouth 2 (two) times daily with a meal.  Dispense: 180 tablet; Refill: 3  2. Hypertriglyceridemia - Lipid panel  3. Essential (primary) hypertension: stable    Plan:   Discussed taking glipizide 30 minutes before a meal. Encouraged updating her eye exam. Further f/u pending lab results.

## 2017-03-25 ENCOUNTER — Telehealth: Payer: Self-pay

## 2017-03-25 LAB — LIPID PANEL
CHOL/HDL RATIO: 5.1 ratio — AB (ref 0.0–4.4)
Cholesterol, Total: 142 mg/dL (ref 100–199)
HDL: 28 mg/dL — ABNORMAL LOW (ref >39)
LDL CALC: 47 mg/dL (ref 0–99)
TRIGLYCERIDES: 335 mg/dL — AB (ref 0–149)
VLDL CHOLESTEROL CAL: 67 mg/dL — AB (ref 5–40)

## 2017-03-25 NOTE — Telephone Encounter (Signed)
Patient advised.KW 

## 2017-03-25 NOTE — Telephone Encounter (Signed)
-----   Message from Carmon Ginsberg, [place] sent at 03/25/2017  7:27 AM EDT ----- Cholesterol is under good control with improved triglycerides. Continue atorvastatin.

## 2017-03-26 ENCOUNTER — Other Ambulatory Visit: Payer: Self-pay | Admitting: Family Medicine

## 2017-04-15 DIAGNOSIS — Z170 Estrogen receptor positive status [ER+]: Secondary | ICD-10-CM | POA: Diagnosis not present

## 2017-04-15 DIAGNOSIS — M8589 Other specified disorders of bone density and structure, multiple sites: Secondary | ICD-10-CM | POA: Diagnosis not present

## 2017-04-15 DIAGNOSIS — Z79811 Long term (current) use of aromatase inhibitors: Secondary | ICD-10-CM | POA: Diagnosis not present

## 2017-04-15 DIAGNOSIS — C50412 Malignant neoplasm of upper-outer quadrant of left female breast: Secondary | ICD-10-CM | POA: Diagnosis not present

## 2017-04-29 ENCOUNTER — Other Ambulatory Visit: Payer: Self-pay | Admitting: Family Medicine

## 2017-04-29 DIAGNOSIS — E1165 Type 2 diabetes mellitus with hyperglycemia: Secondary | ICD-10-CM

## 2017-05-30 ENCOUNTER — Other Ambulatory Visit: Payer: Self-pay | Admitting: Family Medicine

## 2017-06-24 ENCOUNTER — Ambulatory Visit: Payer: BLUE CROSS/BLUE SHIELD | Admitting: Family Medicine

## 2017-07-03 ENCOUNTER — Other Ambulatory Visit: Payer: Self-pay | Admitting: Family Medicine

## 2017-07-31 DIAGNOSIS — E119 Type 2 diabetes mellitus without complications: Secondary | ICD-10-CM | POA: Diagnosis not present

## 2017-07-31 DIAGNOSIS — H524 Presbyopia: Secondary | ICD-10-CM | POA: Diagnosis not present

## 2017-07-31 LAB — HM DIABETES EYE EXAM

## 2017-08-01 ENCOUNTER — Telehealth: Payer: Self-pay

## 2017-08-01 NOTE — Telephone Encounter (Signed)
Patient is requesting new request for any brand that her insurance covers.She reports the old one broke, she needs new prescription. Glucometer Lancet Test strip  Pharmacy:[facility] [place]

## 2017-08-04 ENCOUNTER — Other Ambulatory Visit: Payer: Self-pay | Admitting: Family Medicine

## 2017-08-04 DIAGNOSIS — E1165 Type 2 diabetes mellitus with hyperglycemia: Secondary | ICD-10-CM

## 2017-08-04 MED ORDER — BLOOD GLUCOSE MONITOR KIT: PACK | 0 refills | Status: DC

## 2017-08-04 NOTE — Telephone Encounter (Signed)
done

## 2017-08-04 NOTE — Telephone Encounter (Signed)
Please review and order. KW

## 2017-08-05 ENCOUNTER — Encounter: Payer: Self-pay | Admitting: Family Medicine

## 2017-08-26 DIAGNOSIS — M9901 Segmental and somatic dysfunction of cervical region: Secondary | ICD-10-CM | POA: Diagnosis not present

## 2017-08-26 DIAGNOSIS — M5412 Radiculopathy, cervical region: Secondary | ICD-10-CM | POA: Diagnosis not present

## 2017-08-26 DIAGNOSIS — M9902 Segmental and somatic dysfunction of thoracic region: Secondary | ICD-10-CM | POA: Diagnosis not present

## 2017-08-26 DIAGNOSIS — M62830 Muscle spasm of back: Secondary | ICD-10-CM | POA: Diagnosis not present

## 2017-08-27 DIAGNOSIS — M5412 Radiculopathy, cervical region: Secondary | ICD-10-CM | POA: Diagnosis not present

## 2017-08-27 DIAGNOSIS — M9902 Segmental and somatic dysfunction of thoracic region: Secondary | ICD-10-CM | POA: Diagnosis not present

## 2017-08-27 DIAGNOSIS — M9901 Segmental and somatic dysfunction of cervical region: Secondary | ICD-10-CM | POA: Diagnosis not present

## 2017-08-27 DIAGNOSIS — M62830 Muscle spasm of back: Secondary | ICD-10-CM | POA: Diagnosis not present

## 2017-08-28 ENCOUNTER — Other Ambulatory Visit: Payer: Self-pay | Admitting: Family Medicine

## 2017-09-01 DIAGNOSIS — M9901 Segmental and somatic dysfunction of cervical region: Secondary | ICD-10-CM | POA: Diagnosis not present

## 2017-09-01 DIAGNOSIS — M62830 Muscle spasm of back: Secondary | ICD-10-CM | POA: Diagnosis not present

## 2017-09-01 DIAGNOSIS — M9902 Segmental and somatic dysfunction of thoracic region: Secondary | ICD-10-CM | POA: Diagnosis not present

## 2017-09-01 DIAGNOSIS — M5412 Radiculopathy, cervical region: Secondary | ICD-10-CM | POA: Diagnosis not present

## 2017-09-03 DIAGNOSIS — M9901 Segmental and somatic dysfunction of cervical region: Secondary | ICD-10-CM | POA: Diagnosis not present

## 2017-09-03 DIAGNOSIS — M5412 Radiculopathy, cervical region: Secondary | ICD-10-CM | POA: Diagnosis not present

## 2017-09-03 DIAGNOSIS — M62830 Muscle spasm of back: Secondary | ICD-10-CM | POA: Diagnosis not present

## 2017-09-03 DIAGNOSIS — M9902 Segmental and somatic dysfunction of thoracic region: Secondary | ICD-10-CM | POA: Diagnosis not present

## 2017-09-08 DIAGNOSIS — M9902 Segmental and somatic dysfunction of thoracic region: Secondary | ICD-10-CM | POA: Diagnosis not present

## 2017-09-08 DIAGNOSIS — M62830 Muscle spasm of back: Secondary | ICD-10-CM | POA: Diagnosis not present

## 2017-09-08 DIAGNOSIS — M9901 Segmental and somatic dysfunction of cervical region: Secondary | ICD-10-CM | POA: Diagnosis not present

## 2017-09-08 DIAGNOSIS — M5412 Radiculopathy, cervical region: Secondary | ICD-10-CM | POA: Diagnosis not present

## 2017-09-10 DIAGNOSIS — M62830 Muscle spasm of back: Secondary | ICD-10-CM | POA: Diagnosis not present

## 2017-09-10 DIAGNOSIS — M9901 Segmental and somatic dysfunction of cervical region: Secondary | ICD-10-CM | POA: Diagnosis not present

## 2017-09-10 DIAGNOSIS — M5412 Radiculopathy, cervical region: Secondary | ICD-10-CM | POA: Diagnosis not present

## 2017-09-10 DIAGNOSIS — M9902 Segmental and somatic dysfunction of thoracic region: Secondary | ICD-10-CM | POA: Diagnosis not present

## 2017-09-15 DIAGNOSIS — M9901 Segmental and somatic dysfunction of cervical region: Secondary | ICD-10-CM | POA: Diagnosis not present

## 2017-09-15 DIAGNOSIS — M9902 Segmental and somatic dysfunction of thoracic region: Secondary | ICD-10-CM | POA: Diagnosis not present

## 2017-09-15 DIAGNOSIS — M62830 Muscle spasm of back: Secondary | ICD-10-CM | POA: Diagnosis not present

## 2017-09-15 DIAGNOSIS — M5412 Radiculopathy, cervical region: Secondary | ICD-10-CM | POA: Diagnosis not present

## 2017-09-22 DIAGNOSIS — M62830 Muscle spasm of back: Secondary | ICD-10-CM | POA: Diagnosis not present

## 2017-09-22 DIAGNOSIS — M5412 Radiculopathy, cervical region: Secondary | ICD-10-CM | POA: Diagnosis not present

## 2017-09-22 DIAGNOSIS — M9901 Segmental and somatic dysfunction of cervical region: Secondary | ICD-10-CM | POA: Diagnosis not present

## 2017-09-22 DIAGNOSIS — M9902 Segmental and somatic dysfunction of thoracic region: Secondary | ICD-10-CM | POA: Diagnosis not present

## 2017-09-29 DIAGNOSIS — M62830 Muscle spasm of back: Secondary | ICD-10-CM | POA: Diagnosis not present

## 2017-09-29 DIAGNOSIS — M9901 Segmental and somatic dysfunction of cervical region: Secondary | ICD-10-CM | POA: Diagnosis not present

## 2017-09-29 DIAGNOSIS — M5412 Radiculopathy, cervical region: Secondary | ICD-10-CM | POA: Diagnosis not present

## 2017-09-29 DIAGNOSIS — M9902 Segmental and somatic dysfunction of thoracic region: Secondary | ICD-10-CM | POA: Diagnosis not present

## 2017-10-01 DIAGNOSIS — M5412 Radiculopathy, cervical region: Secondary | ICD-10-CM | POA: Diagnosis not present

## 2017-10-01 DIAGNOSIS — M62830 Muscle spasm of back: Secondary | ICD-10-CM | POA: Diagnosis not present

## 2017-10-01 DIAGNOSIS — M9901 Segmental and somatic dysfunction of cervical region: Secondary | ICD-10-CM | POA: Diagnosis not present

## 2017-10-01 DIAGNOSIS — M9902 Segmental and somatic dysfunction of thoracic region: Secondary | ICD-10-CM | POA: Diagnosis not present

## 2017-10-07 ENCOUNTER — Other Ambulatory Visit: Payer: Self-pay | Admitting: Family Medicine

## 2017-10-08 DIAGNOSIS — M62830 Muscle spasm of back: Secondary | ICD-10-CM | POA: Diagnosis not present

## 2017-10-08 DIAGNOSIS — M5412 Radiculopathy, cervical region: Secondary | ICD-10-CM | POA: Diagnosis not present

## 2017-10-08 DIAGNOSIS — M9901 Segmental and somatic dysfunction of cervical region: Secondary | ICD-10-CM | POA: Diagnosis not present

## 2017-10-08 DIAGNOSIS — M9902 Segmental and somatic dysfunction of thoracic region: Secondary | ICD-10-CM | POA: Diagnosis not present

## 2017-11-13 ENCOUNTER — Other Ambulatory Visit: Payer: Self-pay | Admitting: Family Medicine

## 2017-11-13 DIAGNOSIS — Z5321 Procedure and treatment not carried out due to patient leaving prior to being seen by health care provider: Secondary | ICD-10-CM | POA: Diagnosis not present

## 2017-11-13 DIAGNOSIS — C50412 Malignant neoplasm of upper-outer quadrant of left female breast: Secondary | ICD-10-CM | POA: Diagnosis not present

## 2017-11-13 DIAGNOSIS — Z170 Estrogen receptor positive status [ER+]: Secondary | ICD-10-CM | POA: Diagnosis not present

## 2017-11-13 DIAGNOSIS — Z1231 Encounter for screening mammogram for malignant neoplasm of breast: Secondary | ICD-10-CM | POA: Diagnosis not present

## 2017-11-21 ENCOUNTER — Other Ambulatory Visit: Payer: Self-pay | Admitting: Family Medicine

## 2017-11-24 DIAGNOSIS — Z170 Estrogen receptor positive status [ER+]: Secondary | ICD-10-CM | POA: Diagnosis not present

## 2017-11-24 DIAGNOSIS — Z6829 Body mass index (BMI) 29.0-29.9, adult: Secondary | ICD-10-CM | POA: Diagnosis not present

## 2017-11-24 DIAGNOSIS — F17210 Nicotine dependence, cigarettes, uncomplicated: Secondary | ICD-10-CM | POA: Diagnosis not present

## 2017-11-24 DIAGNOSIS — C50412 Malignant neoplasm of upper-outer quadrant of left female breast: Secondary | ICD-10-CM | POA: Diagnosis not present

## 2017-11-24 DIAGNOSIS — M8589 Other specified disorders of bone density and structure, multiple sites: Secondary | ICD-10-CM | POA: Diagnosis not present

## 2017-11-24 DIAGNOSIS — Z79899 Other long term (current) drug therapy: Secondary | ICD-10-CM | POA: Diagnosis not present

## 2017-11-24 DIAGNOSIS — Z79811 Long term (current) use of aromatase inhibitors: Secondary | ICD-10-CM | POA: Diagnosis not present

## 2017-11-24 DIAGNOSIS — E669 Obesity, unspecified: Secondary | ICD-10-CM | POA: Diagnosis not present

## 2018-01-06 ENCOUNTER — Other Ambulatory Visit: Payer: Self-pay | Admitting: Family Medicine

## 2018-02-25 ENCOUNTER — Other Ambulatory Visit: Payer: Self-pay | Admitting: Family Medicine

## 2018-04-09 ENCOUNTER — Other Ambulatory Visit: Payer: Self-pay | Admitting: Family Medicine

## 2018-04-09 DIAGNOSIS — E1165 Type 2 diabetes mellitus with hyperglycemia: Secondary | ICD-10-CM

## 2018-05-28 ENCOUNTER — Other Ambulatory Visit: Payer: Self-pay | Admitting: Family Medicine

## 2018-06-22 ENCOUNTER — Other Ambulatory Visit: Payer: Self-pay | Admitting: Family Medicine

## 2018-06-22 DIAGNOSIS — E1165 Type 2 diabetes mellitus with hyperglycemia: Secondary | ICD-10-CM

## 2018-08-20 ENCOUNTER — Other Ambulatory Visit: Payer: Self-pay | Admitting: Family Medicine

## 2018-10-06 ENCOUNTER — Other Ambulatory Visit: Payer: Self-pay | Admitting: Family Medicine

## 2018-10-06 DIAGNOSIS — E1165 Type 2 diabetes mellitus with hyperglycemia: Secondary | ICD-10-CM

## 2018-11-28 ENCOUNTER — Other Ambulatory Visit: Payer: Self-pay | Admitting: Family Medicine

## 2019-01-05 ENCOUNTER — Encounter: Payer: Self-pay | Admitting: Family Medicine

## 2019-01-05 ENCOUNTER — Ambulatory Visit: Payer: BLUE CROSS/BLUE SHIELD | Admitting: Family Medicine

## 2019-01-05 VITALS — BP 140/82 | HR 73 | Temp 98.7°F | Resp 16 | Ht 66.0 in | Wt 171.0 lb

## 2019-01-05 DIAGNOSIS — E782 Mixed hyperlipidemia: Secondary | ICD-10-CM

## 2019-01-05 DIAGNOSIS — F411 Generalized anxiety disorder: Secondary | ICD-10-CM

## 2019-01-05 DIAGNOSIS — Z720 Tobacco use: Secondary | ICD-10-CM

## 2019-01-05 DIAGNOSIS — C50412 Malignant neoplasm of upper-outer quadrant of left female breast: Secondary | ICD-10-CM | POA: Diagnosis not present

## 2019-01-05 DIAGNOSIS — R21 Rash and other nonspecific skin eruption: Secondary | ICD-10-CM | POA: Diagnosis not present

## 2019-01-05 DIAGNOSIS — Z170 Estrogen receptor positive status [ER+]: Secondary | ICD-10-CM | POA: Diagnosis not present

## 2019-01-05 DIAGNOSIS — Z1231 Encounter for screening mammogram for malignant neoplasm of breast: Secondary | ICD-10-CM | POA: Diagnosis not present

## 2019-01-05 DIAGNOSIS — E1165 Type 2 diabetes mellitus with hyperglycemia: Secondary | ICD-10-CM | POA: Diagnosis not present

## 2019-01-05 DIAGNOSIS — I10 Essential (primary) hypertension: Secondary | ICD-10-CM

## 2019-01-05 LAB — POCT GLYCOSYLATED HEMOGLOBIN (HGB A1C): Hemoglobin A1C: 12.2 % — AB (ref 4.0–5.6)

## 2019-01-05 LAB — HM MAMMOGRAPHY

## 2019-01-05 MED ORDER — LISINOPRIL 10 MG PO TABS: 10.0000 mg | ORAL_TABLET | Freq: Every day | ORAL | 3 refills | Status: DC

## 2019-01-05 MED ORDER — METFORMIN HCL 500 MG PO TABS: 500.0000 mg | ORAL_TABLET | Freq: Two times a day (BID) | ORAL | 1 refills | Status: DC

## 2019-01-05 MED ORDER — SERTRALINE HCL 100 MG PO TABS: 100.0000 mg | ORAL_TABLET | Freq: Every day | ORAL | 3 refills | Status: DC

## 2019-01-05 MED ORDER — GLIPIZIDE 10 MG PO TABS: ORAL_TABLET | ORAL | 0 refills | Status: DC

## 2019-01-05 MED ORDER — NYSTATIN-TRIAMCINOLONE 100000-0.1 UNIT/GM-% EX OINT: 1.0000 "application " | TOPICAL_OINTMENT | Freq: Two times a day (BID) | CUTANEOUS | 1 refills | Status: DC

## 2019-01-05 NOTE — Progress Notes (Signed)
  Subjective:     Patient ID: Cassandra Butler, female   DOB: 1966-09-11, 53 y.o.   MRN: [id number] Chief Complaint  Patient presents with  . Abnormal Lab    Patient would like to address recent biometric screening done by her insurance company.  . Vaginal Pain    Patient reports that over a week ago she had teeth pulled and was prescribed antibiotic to treat for yeast infection a week ago since she was still on antibiotic for tooth extraction, patient reports that she has had pain and irritation around the clitoris area and around her trunk.  . Diabetes    Patient returns to office today for follow up, paitent was last seen 03/24/17, HgbA1C at last visit was 9.0%. Patient reports that she has had good compliance on Meformin and Glipizide.   . Hyperlipidemia    Patient returns for follopw up from 03/24/17, patient reports good compliance on Atorvastatin. Patient reports that she is working on her diet and is actively exercising.   Marland Kitchen Hypertension    Patient returns for follow up from 03/24/17 patients blood pressure at last visit was 120/76.    HPI Patient returns today after nearly a two year hiatus for f/u her chronic medical problems. She has developed an itchy to painful vulvar rash in the setting of poorly controlled diabetes and recent antibiotic use. No hx or exposure to HSV. She has been taking her diabetes medication just once a day in the evening. She is no longer on lisinopril. Continues to be followed by oncology for breast cancer. Still a 1 ppd smoker. Recent labs reflect sugar in the 300's.                ROS No chest pain Objective:   Physical Exam Constitutional:      General: She is not in acute distress. Cardiovascular:     Rate and Rhythm: Normal rate and regular rhythm.     Heart sounds: Normal heart sounds.  Genitourinary:    Comments: Rash with ulceration/excoriation  on her labia majora. Musculoskeletal:     Right lower leg: No edema.     Left lower leg: No edema.   Neurological:     Mental Status: She is alert.        Assessment:    1. Type 2 diabetes mellitus with hyperglycemia, without long-term current use of insulin (HCC) - POCT glycosylated hemoglobin (Hb A1C) - glipiZIDE (GLUCOTROL) 10 MG tablet; TAKE 1 TABLET BY MOUTH TWICE DAILY BEFORE MEALS( 30 MINUTES PRIOR TO EATING)  Dispense: 180 tablet; Refill: 0 - metFORMIN (GLUCOPHAGE) 500 MG tablet; Take 1 tablet (500 mg total) by mouth 2 (two) times daily with a meal.  Dispense: 180 tablet; Refill: 1  2. Essential (primary) hypertension: resume lisinopril  3. Vulvar rash - nystatin-triamcinolone ointment (MYCOLOG); Apply 1 application topically 2 (two) times daily.  Dispense: 30 g; Refill: 1 - HSV Type I/II IgG, IgMw/ reflex  4. Generalized anxiety disorder: refill sertraline  5. Current tobacco use  6. Mixed hyperlipidemia; continue atorvastatin    Plan:    Instructed on twice daily administration of her diabetes medication. Will establish with Dr. Jacinto Reap. Due to my retirement.

## 2019-01-05 NOTE — Patient Instructions (Signed)
Do take your diabetes twice daily as directed. We will call with the lab results if you decided to get them Do establish with Dr. B. In 3 months or so.

## 2019-01-13 DIAGNOSIS — M8589 Other specified disorders of bone density and structure, multiple sites: Secondary | ICD-10-CM | POA: Diagnosis not present

## 2019-01-13 DIAGNOSIS — Z170 Estrogen receptor positive status [ER+]: Secondary | ICD-10-CM | POA: Diagnosis not present

## 2019-01-13 DIAGNOSIS — Z79811 Long term (current) use of aromatase inhibitors: Secondary | ICD-10-CM | POA: Diagnosis not present

## 2019-01-13 DIAGNOSIS — C50412 Malignant neoplasm of upper-outer quadrant of left female breast: Secondary | ICD-10-CM | POA: Diagnosis not present

## 2019-01-19 LAB — HM DEXA SCAN

## 2019-02-24 ENCOUNTER — Other Ambulatory Visit: Payer: Self-pay | Admitting: Family Medicine

## 2019-02-24 MED ORDER — OMEPRAZOLE 20 MG PO CPDR: 20.0000 mg | DELAYED_RELEASE_CAPSULE | Freq: Every day | ORAL | 0 refills | Status: DC

## 2019-02-24 NOTE — Telephone Encounter (Signed)
[facility] faxed refill request for the following medications:  omeprazole (PRILOSEC) 20 MG capsule     Please advise.  

## 2019-03-22 IMAGING — MR MR HEAD WO/W CM
10 of 12 series · 31 of 48 positions shown · IV contrast (17 mL MULTIHANCE)
Comparison: Head CT 01/27/2017

CLINICAL DATA: Posterior headaches. Question Chiari malformation on
prior head CT.

EXAM:
MRI HEAD WITHOUT AND WITH CONTRAST
TECHNIQUE: Multiplanar, multiecho pulse sequences of the brain and surrounding
structures were obtained without and with intravenous contrast.
CONTRAST:  17mL MULTIHANCE GADOBENATE DIMEGLUMINE 529 MG/ML IV SOLN

[Series 2: T1 · sagittal · 5.0mm · 0.47mm/px · 1 of 23 slices shown (1 of 2)]
[im 1/23]
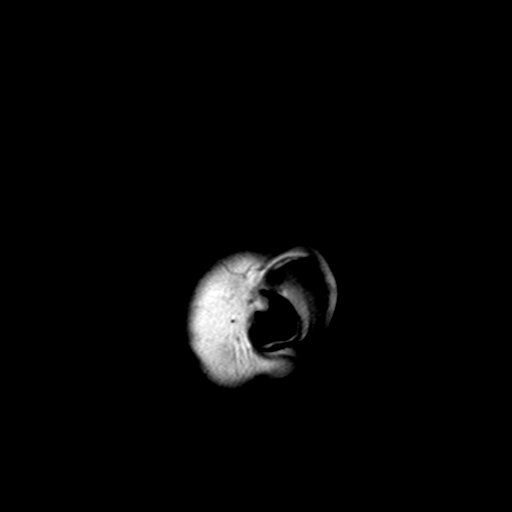

[Series 4: DWI · axial · 3.0mm · 0.94mm/px · z∈[-74,+78]mm · 3 of 52 slices shown (1 of 2)]
[im 1/52]
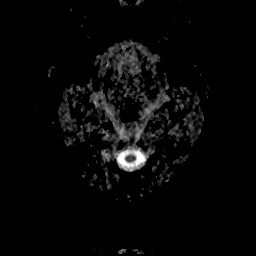
[im 26/52]
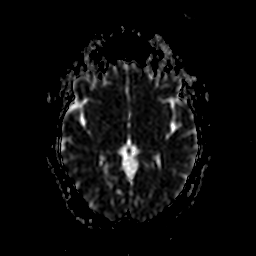
[im 52/52]
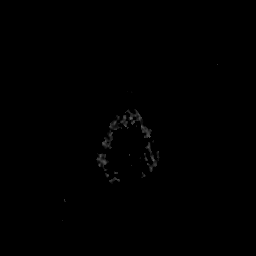

[Series 6: DWI · coronal · 5.0mm · 1.80mm/px · 3 of 38 slices shown (2 of 2)]
[im 1/38]
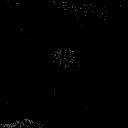
[im 19/38]
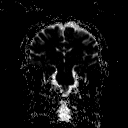
[im 38/38]
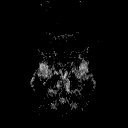

[Series 9: T2 · axial · 5.0mm · 0.45mm/px · z∈[-81,+72]mm · 2 of 23 slices shown (1 of 2)]
[im 1/23]
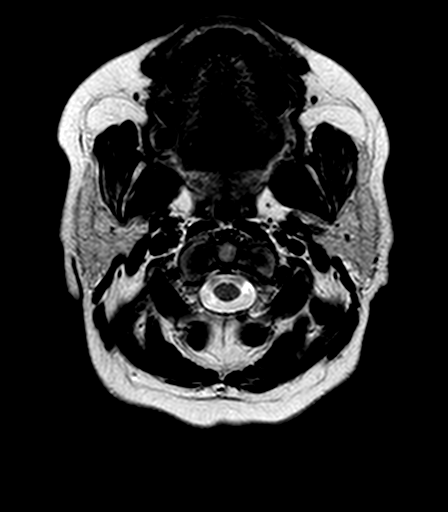
[im 23/23]
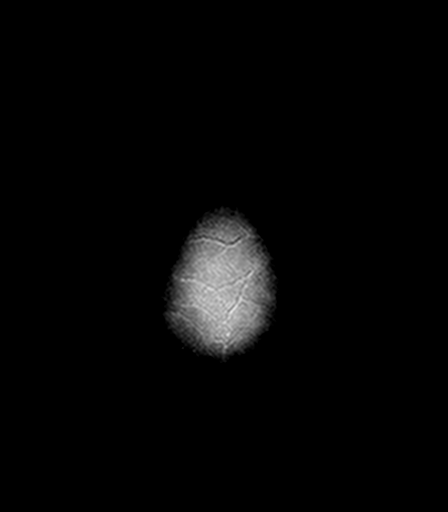

[Series 10: FLAIR · axial · 3.0mm · 0.90mm/px · z∈[-79,+70]mm · 4 of 51 slices shown]
[im 1/51]
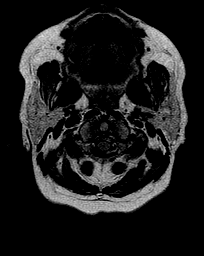
[im 17/51]
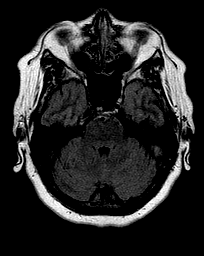
[im 34/51]
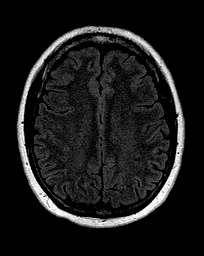
[im 51/51]
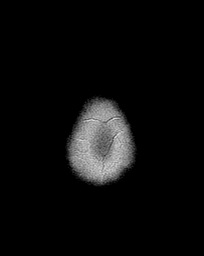

[Series 11: T2 · axial · 5.0mm · 0.45mm/px · z∈[-82,+73]mm · 2 of 27 slices shown (2 of 2)]
[im 1/27]
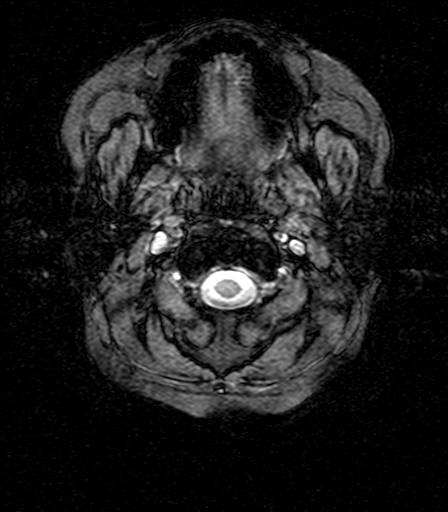
[im 27/27]
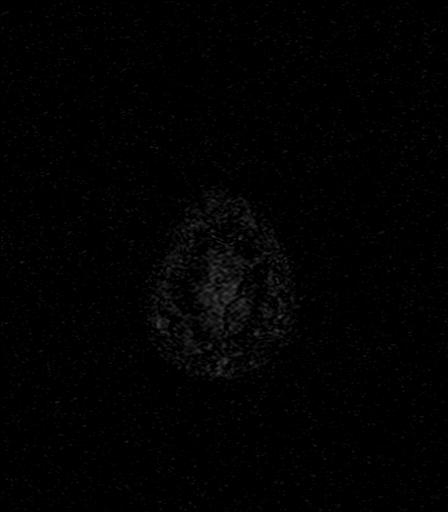

[Series 12: T1 · axial · 1.0mm · 0.45mm/px · 1 of 160 slices shown (2 of 2)]
[im 1/160]
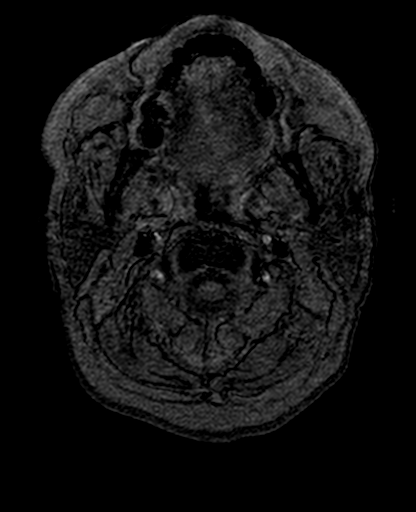

[Series 13: T2 post-contrast · coronal · 5.0mm · 0.45mm/px · 2 of 29 slices shown]
[im 1/29]
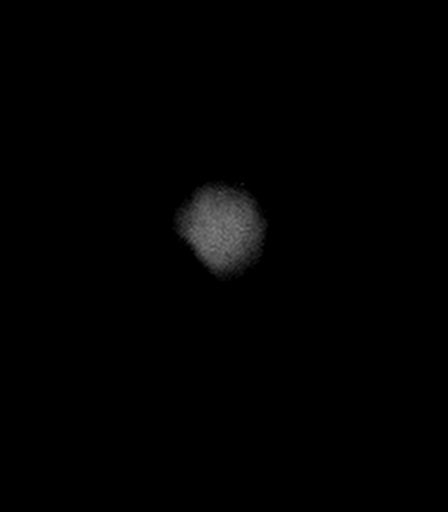
[im 29/29]
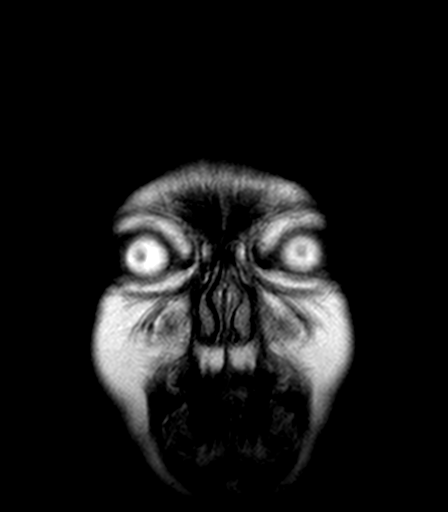

[Series 14: T1 post-contrast · axial · 1.0mm · 0.45mm/px · z∈[-88,+71]mm · 11 of 160 slices shown (1 of 2)]
[im 1/160]
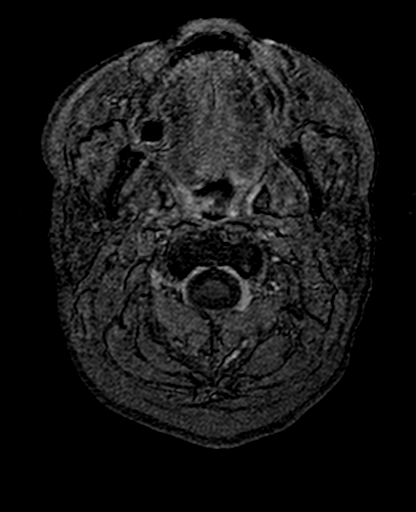
[im 16/160]
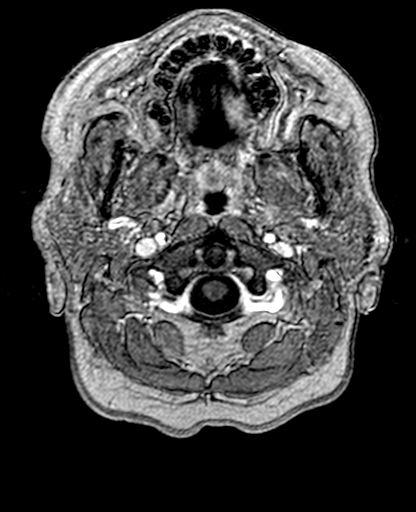
[im 32/160]
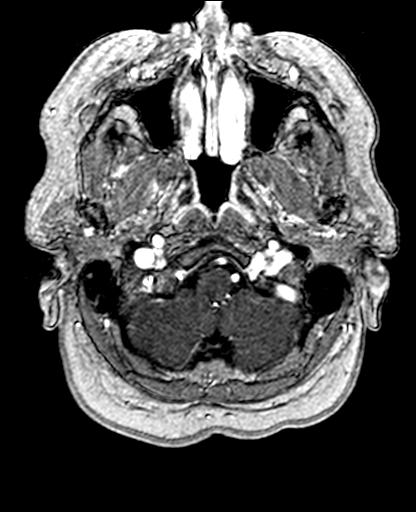
[im 48/160]
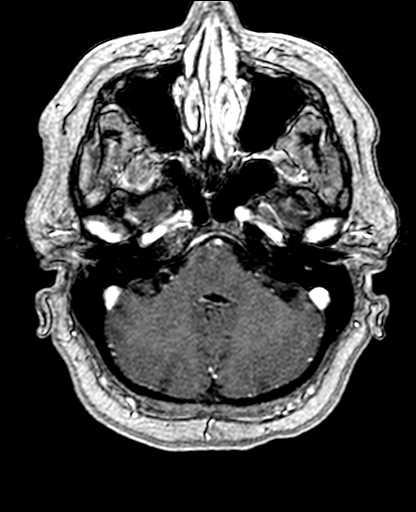
[im 64/160]
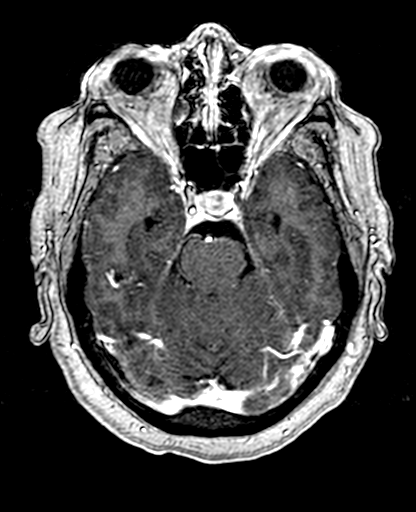
[im 80/160]
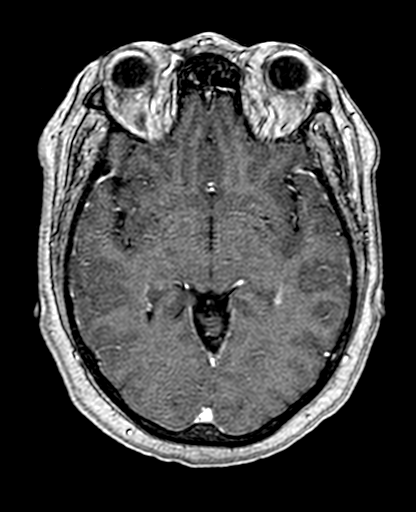
[im 96/160]
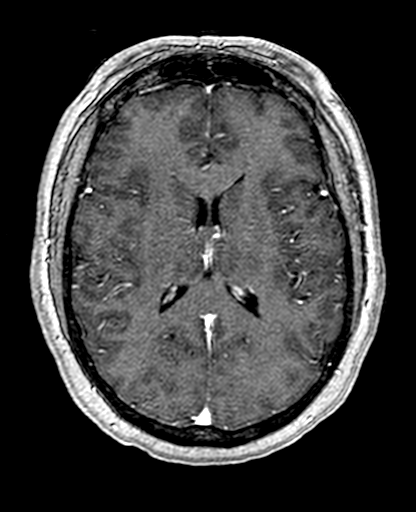
[im 112/160]
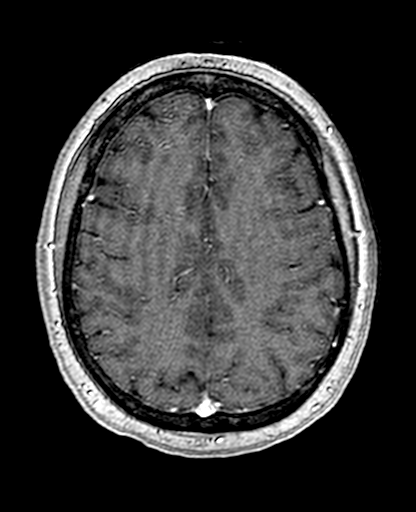
[im 128/160]
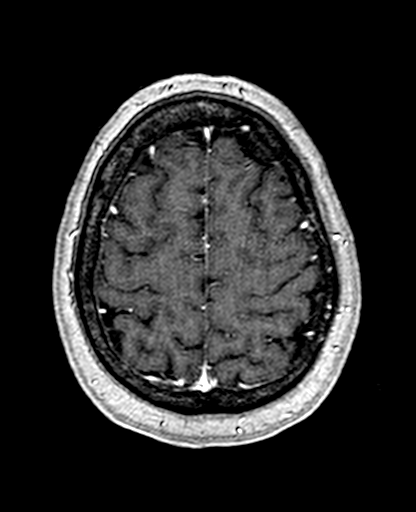
[im 144/160]
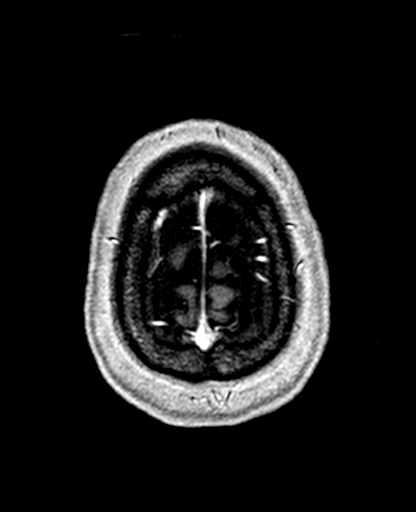
[im 160/160]
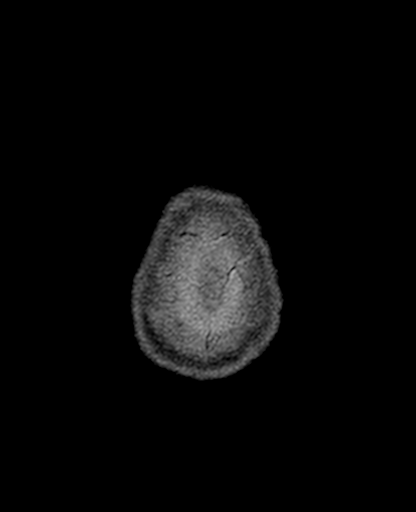

[Series 15: T1 post-contrast · coronal · 5.0mm · 0.45mm/px · 2 of 29 slices shown (2 of 2)]
[im 1/29]
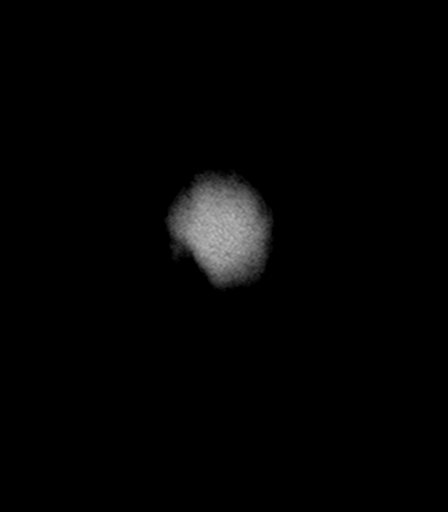
[im 29/29]
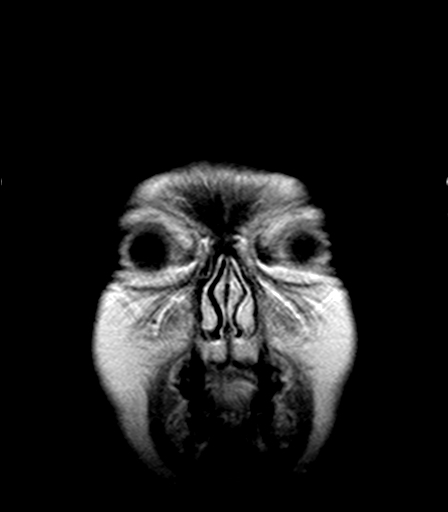

[31 of 48 positions shown; findings below may reference images not displayed]

FINDINGS: Brain: No acute or remote infarction, hemorrhage, hydrocephalus,
extra-axial collection or mass lesion. No white matter disease or
atrophy. No Chiari malformation.

Vascular: Normal flow voids

Skull and upper cervical spine: Negative

Sinuses/Orbits: Negative
IMPRESSION: Negative exam. No Chiari malformation or other explanation for
headache.

## 2019-03-29 ENCOUNTER — Other Ambulatory Visit: Payer: Self-pay | Admitting: Family Medicine

## 2019-03-29 ENCOUNTER — Other Ambulatory Visit: Payer: Self-pay

## 2019-03-29 ENCOUNTER — Ambulatory Visit (INDEPENDENT_AMBULATORY_CARE_PROVIDER_SITE_OTHER): Payer: BLUE CROSS/BLUE SHIELD | Admitting: Family Medicine

## 2019-03-29 DIAGNOSIS — L255 Unspecified contact dermatitis due to plants, except food: Secondary | ICD-10-CM | POA: Diagnosis not present

## 2019-03-29 DIAGNOSIS — E1165 Type 2 diabetes mellitus with hyperglycemia: Secondary | ICD-10-CM

## 2019-03-29 MED ORDER — PREDNISONE 10 MG (21) PO TBPK: ORAL_TABLET | ORAL | 0 refills | Status: DC

## 2019-03-29 MED ORDER — OMEPRAZOLE 20 MG PO CPDR: 20.0000 mg | DELAYED_RELEASE_CAPSULE | Freq: Every day | ORAL | 0 refills | Status: DC

## 2019-03-29 NOTE — Telephone Encounter (Signed)
[facility] faxed refill request for the following medications:  omeprazole (PRILOSEC) 20 MG capsule     Please advise.  

## 2019-03-29 NOTE — Progress Notes (Signed)
Cassandra Butler  MRN: [id number] DOB: Sep 15, 1966  Subjective:  HPI   Virtual Visit via Video Note  I connected with Cassandra Butler on 03/29/19 at 10:40 AM EDT by a video enabled telemedicine application and verified that I am speaking with the correct person using two identifiers.  Location: Patient: home Provider: office   I discussed the limitations of evaluation and management by telemedicine and the availability of in person appointments. The patient expressed understanding and agreed to proceed.  The patient was seen via electronic device for a rash that she believed to be contact dermatitis. States she has been exposed to poison ivy Saturday working in her yard. Benadryl and Ivarest slight help with itching but concerned about rash appearing around her eyes, also.  Patient Active Problem List   Diagnosis Date Noted  . Osteopenia of multiple sites 10/08/2016  . Aromatase inhibitor use 10/08/2016  . Type 2 diabetes mellitus with hyperglycemia ([place]) 02/01/2016  . Breast CA ([place]) 04/04/2015  . HLD (hyperlipidemia) 04/04/2015  . Hypertriglyceridemia 04/04/2015  . Obstructive sleep apnea of adult 04/04/2015  . Malignant neoplasm of breast ([place]) 05/18/2012  . Avitaminosis D 02/14/2010  . Essential (primary) hypertension 08/07/2009  . Family history of cardiovascular disease 02/09/2008  . Current tobacco use 01/27/2008  . Anxiety disorder 12/18/2004  . Acid reflux 12/18/2004   Past Medical History:  Diagnosis Date  . Cancer ([place]) 11/14/2007   Left breast wide excision, sentinel node biopsy. 1.4 cm, T1c, N0: ER 90%; PR 905; Her 2 neu not over expressed.   . Diabetes mellitus without complication ([place])   . GERD (gastroesophageal reflux disease)   . Hyperlipidemia   . Hypertension   . Vitamin D deficiency    Social History   Socioeconomic History  . Marital status: Married    Spouse name: Not on file  . Number of children: Not on file  . Years of education: Not on  file  . Highest education level: Not on file  Occupational History  . Not on file  Social Needs  . Financial resource strain: Not on file  . Food insecurity:    Worry: Not on file    Inability: Not on file  . Transportation needs:    Medical: Not on file    Non-medical: Not on file  Tobacco Use  . Smoking status: Current Every Day Smoker    Packs/day: 1.00    Types: Cigarettes  . Smokeless tobacco: Never Used  Substance and Sexual Activity  . Alcohol use: No  . Drug use: No  . Sexual activity: Not on file  Lifestyle  . Physical activity:    Days per week: Not on file    Minutes per session: Not on file  . Stress: Not on file  Relationships  . Social connections:    Talks on phone: Not on file    Gets together: Not on file    Attends religious service: Not on file    Active member of club or organization: Not on file    Attends meetings of clubs or organizations: Not on file    Relationship status: Not on file  . Intimate partner violence:    Fear of current or ex partner: Not on file    Emotionally abused: Not on file    Physically abused: Not on file    Forced sexual activity: Not on file  Other Topics Concern  . Not on file  Social History Narrative  . Not on  file   Outpatient Encounter Medications as of 03/29/2019  Medication Sig Note  . atorvastatin (LIPITOR) 80 MG tablet TAKE 1 TABLET BY MOUTH DAILY   . BAYER CONTOUR NEXT TEST test strip CHECK SUGAR D 02/29/2016: Received from: External Pharmacy  . blood glucose meter kit and supplies KIT Check sugar daily   . Cholecalciferol (VITAMIN D3) 2000 units TABS Take by mouth.   Marland Kitchen exemestane (AROMASIN) 25 MG tablet Take by mouth daily. 04/04/2015: Received from: [facility]:   . glipiZIDE (GLUCOTROL) 10 MG tablet TAKE 1 TABLET BY MOUTH TWICE DAILY BEFORE MEALS( 30 MINUTES PRIOR TO EATING)   . ibuprofen (ADVIL,MOTRIN) 200 MG tablet Take 200 mg by mouth every 6 (six) hours as needed.   Marland Kitchen  lisinopril (PRINIVIL,ZESTRIL) 10 MG tablet Take 1 tablet (10 mg total) by mouth daily.   . metFORMIN (GLUCOPHAGE) 500 MG tablet Take 1 tablet (500 mg total) by mouth 2 (two) times daily with a meal.   . MICROLET LANCETS MISC CHECK SUGAR D 02/29/2016: Received from: External Pharmacy  . nystatin-triamcinolone ointment (MYCOLOG) Apply 1 application topically 2 (two) times daily.   . Omega-3 Fatty Acids (FISH OIL) 1000 MG CAPS Take by mouth daily.   Marland Kitchen omeprazole (PRILOSEC) 20 MG capsule Take 1 capsule (20 mg total) by mouth daily.   . sertraline (ZOLOFT) 100 MG tablet Take 1 tablet (100 mg total) by mouth daily.    No facility-administered encounter medications on file as of 03/29/2019.     No Known Allergies  Review of Systems  Skin: Positive for itching and rash.    Objective:    Patient is unable to obtain vital signs.  WDWN female in no apparent distress.  Head: Normocephalic, atraumatic. Neck: Supple, NROM Respiratory: No apparent distress Psych: Normal mood and affect Skin: Pruritic pink rash around corners of eyes bilaterally and on forearms. States she has similar rash scattered on legs, also.  Assessment and Plan :  1. Rhus dermatitis Pruritic rash scattered over arms and legs with facial lesion around the lateral corners of eyes bilaterally since working in her yard 2 days ago. Is sure she was exposed to poison ivy. Not much relief from OTC treatments with Ivarest. May use Benadryl for itching and add Prednisone 6 day taper since it is on her face. Recheck prn. - predniSONE (STERAPRED UNI-PAK 21 TAB) 10 MG (21) TBPK tablet; Take taper by mouth as directed on package.  Dispense: 21 tablet; Refill: 0  2. Type 2 diabetes mellitus with hyperglycemia, without long-term current use of insulin (HCC) Recheck blood sugar checks at home have been well over 200 and she had not been taking the Metformin and Glipizide more than once a day (previously prescribed for BID). Warned her the  prednisone could cause FBS to elevate and she should take the diabetes meds BID. Reminded to keep appointment with Dr. Brita Romp scheduled for 04-07-19 at 8:00 am to follow up diabetes.  I discussed the assessment and treatment plan with the patient. The patient was provided an opportunity to ask questions and all were answered. The patient agreed with the plan and demonstrated an understanding of the instructions.   The patient was advised to call back or seek an in-person evaluation if the symptoms worsen or if the condition fails to improve as anticipated.  I provided 12 minutes of non-face-to-face time during this encounter.

## 2019-04-07 ENCOUNTER — Ambulatory Visit: Payer: Self-pay | Admitting: Family Medicine

## 2019-05-04 ENCOUNTER — Other Ambulatory Visit: Payer: Self-pay | Admitting: Family Medicine

## 2019-05-04 NOTE — Telephone Encounter (Signed)
[facility] faxed refill request for the following medications:  omeprazole (PRILOSEC) 20 MG capsule     Please advise.  

## 2019-05-05 MED ORDER — OMEPRAZOLE 20 MG PO CPDR: 20.0000 mg | DELAYED_RELEASE_CAPSULE | Freq: Every day | ORAL | 0 refills | Status: DC

## 2019-05-31 ENCOUNTER — Other Ambulatory Visit: Payer: Self-pay | Admitting: Physician Assistant

## 2019-05-31 NOTE — Telephone Encounter (Signed)
ThiIs patient needs to be schedule for a follow up. She no showed her establish care appointment with Dr. Jacinto Reap. Her diabetes is extremely poorly controlled. No more refills after this until she follows up.

## 2019-06-10 ENCOUNTER — Encounter: Payer: Self-pay | Admitting: General Surgery

## 2019-06-29 ENCOUNTER — Other Ambulatory Visit: Payer: Self-pay | Admitting: Physician Assistant

## 2019-06-29 DIAGNOSIS — K219 Gastro-esophageal reflux disease without esophagitis: Secondary | ICD-10-CM

## 2019-06-30 NOTE — Telephone Encounter (Signed)
Refilled 30 days. Needs to schedule and establish with somebody new, her diabetes is extremely uncontrolled.

## 2019-06-30 NOTE — Telephone Encounter (Signed)
Please advise refill? 

## 2019-07-27 ENCOUNTER — Other Ambulatory Visit: Payer: Self-pay | Admitting: Family Medicine

## 2019-07-27 DIAGNOSIS — K219 Gastro-esophageal reflux disease without esophagitis: Secondary | ICD-10-CM

## 2019-07-27 MED ORDER — OMEPRAZOLE 20 MG PO CPDR: DELAYED_RELEASE_CAPSULE | ORAL | 0 refills | Status: DC

## 2019-07-27 NOTE — Telephone Encounter (Signed)
Pt has an appointment with you tomorrow.   Thanks,   -Laura  

## 2019-07-27 NOTE — Telephone Encounter (Signed)
[facility] faxed refill request for the following medications:  omeprazole (PRILOSEC) 20 MG capsule     Please advise.  

## 2019-07-27 NOTE — Progress Notes (Signed)
Patient: Cassandra Butler Female    DOB: 1966/03/10   53 y.o.   MRN: [id number] Visit Date: 07/28/2019  Today's Provider: Trinna Post, PA-C   Chief Complaint  Patient presents with  . Diabetes   Subjective:   Patient presents today to establish care with new provider and for follow up. She was previously seeing Mariel Sleet PA-C who has since retired. She lives in [place] with her husband of 35 years. She has one daughter aged 29 and one son aged 37. She works in [employer].   DM II  She has been diagnosed with diabetes 7 years ago. She reports that at one time it was 6.0%. In the past 5 years of documented A1cs it has ranged from 9.0% to 12.2%. Today it is 14%. She reports she has gotten off track. She is eating poorly. She is not taking her medications as directed. She is taking metformin 500 mg QHS though it is prescribed 500 mg BID because she was worried this dropped her blood sugar. She is prescribed glipizide 10 mg BID but is only taking this once because she doesn't often eat breakfast. Reports she had a diabetic eye exam at [facility] around one year ago.    Lab Results  Component Value Date   HGBA1C 14.0 (A) 07/28/2019   HLD/hypertriglyceridemia: She is currently taking Lipitor 80 mg without issue. She is also taking fish oil.   Lipid Panel     Component Value Date/Time   CHOL 142 03/24/2017 0849   TRIG 335 (H) 03/24/2017 0849   HDL 28 (L) 03/24/2017 0849   CHOLHDL 5.1 (H) 03/24/2017 0849   LDLCALC 47 03/24/2017 0849   HTN: She is currently taking lisinopril 10 mg daily.   Depression and Anxiety: She is currently taking zoloft 100 mg daily.   Kidney Disease: She reports she only has one functioning kidney on the right side. She had previous surgery to restore function of left kidney which was unsuccessful.   Breast Cancer: History of breast cancer diagnosed 10 years ago. She did not need chemotherapy. She gets mammograms at [facility] and most  recent was 01/05/2019 and it was normal.   HPI  Diabetes Mellitus Type II, Follow-up:   Lab Results  Component Value Date   HGBA1C 14.0 (A) 07/28/2019   HGBA1C 12.2 (A) 01/05/2019   HGBA1C 9.0 03/24/2017    Last seen for diabetes 3 months ago.  Management since then includes take Metfornin and Glipizide BID. She reports poor compliance with treatment. She is not having side effects.  Current symptoms include none and have been stable. Home blood sugar records: fasting range: not being checked  Episodes of hypoglycemia? no   Current insulin regiment: Is not on insulin Most Recent Eye Exam: UTD Weight trend: stable Prior visit with dietician: No Current exercise: none Current diet habits: in general, a "healthy" diet    Pertinent Labs:    Component Value Date/Time   CHOL 142 03/24/2017 0849   TRIG 335 (H) 03/24/2017 0849   HDL 28 (L) 03/24/2017 0849   LDLCALC 47 03/24/2017 0849   CREATININE 1.02 (H) 01/27/2017 1735   CREATININE 0.88 02/17/2015 1423    Wt Readings from Last 3 Encounters:  07/28/19 165 lb 3.2 oz (74.9 kg)  01/05/19 171 lb (77.6 kg)  03/24/17 175 lb 6.4 oz (79.6 kg)    ------------------------------------------------------------------------  No Known Allergies   Current Outpatient Medications:  .  atorvastatin (  LIPITOR) 80 MG tablet, TAKE 1 TABLET BY MOUTH DAILY, Disp: 90 tablet, Rfl: 3 .  BAYER CONTOUR NEXT TEST test strip, CHECK SUGAR D, Disp: , Rfl: 0 .  blood glucose meter kit and supplies KIT, Check sugar daily, Disp: 1 each, Rfl: 0 .  Cholecalciferol (VITAMIN D3) 2000 units TABS, Take by mouth., Disp: , Rfl:  .  glipiZIDE (GLUCOTROL) 10 MG tablet, TAKE 1 TABLET BY MOUTH TWICE DAILY BEFORE MEALS( 30 MINUTES PRIOR TO EATING) (Patient taking differently: 10 mg daily before breakfast. TAKE 1 TABLET BY MOUTH TWICE DAILY BEFORE MEALS( 30 MINUTES PRIOR TO EATING)), Disp: 180 tablet, Rfl: 0 .  ibuprofen (ADVIL,MOTRIN) 200 MG tablet, Take 200 mg by  mouth every 6 (six) hours as needed., Disp: , Rfl:  .  lisinopril (PRINIVIL,ZESTRIL) 10 MG tablet, Take 1 tablet (10 mg total) by mouth daily., Disp: 90 tablet, Rfl: 3 .  metFORMIN (GLUCOPHAGE) 500 MG tablet, Take 1 tablet (500 mg total) by mouth 2 (two) times daily with a meal., Disp: 180 tablet, Rfl: 1 .  MICROLET LANCETS MISC, CHECK SUGAR D, Disp: , Rfl: 0 .  nystatin-triamcinolone ointment (MYCOLOG), Apply 1 application topically 2 (two) times daily., Disp: 30 g, Rfl: 1 .  Omega-3 Fatty Acids (FISH OIL) 1000 MG CAPS, Take by mouth daily., Disp: , Rfl:  .  omeprazole (PRILOSEC) 20 MG capsule, TAKE 1 CAPSULE(20 MG) BY MOUTH DAILY, Disp: 30 capsule, Rfl: 0 .  sertraline (ZOLOFT) 100 MG tablet, Take 1 tablet (100 mg total) by mouth daily., Disp: 90 tablet, Rfl: 3  Review of Systems  Constitutional: Negative.   Eyes: Negative.   Respiratory: Negative.   Cardiovascular: Negative.   Endocrine: Negative.     Social History   Tobacco Use  . Smoking status: Current Every Day Smoker    Packs/day: 1.00    Types: Cigarettes  . Smokeless tobacco: Never Used  Substance Use Topics  . Alcohol use: No      Objective:   BP 120/70 (BP Location: Left Arm, Patient Position: Sitting, Cuff Size: Normal)   Pulse 95   Temp (!) 97.3 F (36.3 C) (Temporal)   Resp 16   Ht '5\' 6"'  (1.676 m)   Wt 165 lb 3.2 oz (74.9 kg)   SpO2 95%   BMI 26.66 kg/m  Vitals:   07/28/19 0820  BP: 120/70  Pulse: 95  Resp: 16  Temp: (!) 97.3 F (36.3 C)  TempSrc: Temporal  SpO2: 95%  Weight: 165 lb 3.2 oz (74.9 kg)  Height: '5\' 6"'  (1.676 m)  Body mass index is 26.66 kg/m.   Physical Exam   Results for orders placed or performed in visit on 07/28/19  POCT glycosylated hemoglobin (Hb A1C)  Result Value Ref Range   Hemoglobin A1C 14.0 (A) 4.0 - 5.6 %       Assessment & Plan    1. Type 2 diabetes mellitus with hyperglycemia, without long-term current use of insulin (HCC)  Her diabetes is extremely  uncontrolled and has been for the past 5 years. Had a serious conversation about this patient about how concerning this is, especially in the context of only one functioning kidney. We will max out metformin to 1000 mg BID and she will take glipizide with a small meal morning and night. We will refer to CCM for DM education, dietary and lifestyle education. She will check her fasting blood sugars at least three times a week and bring them to her next appointment. Pneumonia updated  today. Requesting eye exam from [facility].   - POCT glycosylated hemoglobin (Hb A1C) - Comprehensive Metabolic Panel (CMET) - CBC with Differential - Lipid Profile - TSH - metFORMIN (GLUCOPHAGE) 1000 MG tablet; Take 1 tablet (1,000 mg total) by mouth 2 (two) times daily with a meal.  Dispense: 180 tablet; Refill: 0 - Ambulatory referral to [facility]  2. Mixed hyperlipidemia  - Direct LDL  3. Essential (primary) hypertension  Controlled, continue Lisinopril.   4. Need for vaccination for Strep pneumoniae  Update pneumovax today.   5. Malignant neoplasm of female breast, unspecified estrogen receptor status, unspecified laterality, unspecified site of breast ([place])  She follows with [facility]. Last mammogram 01/05/2019 normal.  6. Hypertriglyceridemia  Taking Lipitor 80 mg daily.  7. Generalized anxiety disorder  Zoloft 100 mg daily.  The entirety of the information documented in the History of Present Illness, Review of Systems and Physical Exam were personally obtained by me. Portions of this information were initially documented by Lynford Humphrey, CMA and reviewed by me for thoroughness and accuracy.   F/u 1 month Diabetes      Trinna Post, [facility]

## 2019-07-28 ENCOUNTER — Other Ambulatory Visit: Payer: Self-pay | Admitting: Physician Assistant

## 2019-07-28 ENCOUNTER — Ambulatory Visit (INDEPENDENT_AMBULATORY_CARE_PROVIDER_SITE_OTHER): Payer: BC Managed Care – PPO | Admitting: Physician Assistant

## 2019-07-28 ENCOUNTER — Encounter: Payer: Self-pay | Admitting: Physician Assistant

## 2019-07-28 ENCOUNTER — Other Ambulatory Visit: Payer: Self-pay

## 2019-07-28 VITALS — BP 120/70 | HR 95 | Temp 97.3°F | Resp 16 | Ht 66.0 in | Wt 165.2 lb

## 2019-07-28 DIAGNOSIS — E782 Mixed hyperlipidemia: Secondary | ICD-10-CM

## 2019-07-28 DIAGNOSIS — E1165 Type 2 diabetes mellitus with hyperglycemia: Secondary | ICD-10-CM

## 2019-07-28 DIAGNOSIS — E781 Pure hyperglyceridemia: Secondary | ICD-10-CM

## 2019-07-28 DIAGNOSIS — Z23 Encounter for immunization: Secondary | ICD-10-CM | POA: Diagnosis not present

## 2019-07-28 DIAGNOSIS — C50919 Malignant neoplasm of unspecified site of unspecified female breast: Secondary | ICD-10-CM

## 2019-07-28 DIAGNOSIS — I10 Essential (primary) hypertension: Secondary | ICD-10-CM | POA: Diagnosis not present

## 2019-07-28 DIAGNOSIS — F411 Generalized anxiety disorder: Secondary | ICD-10-CM

## 2019-07-28 LAB — POCT GLYCOSYLATED HEMOGLOBIN (HGB A1C): Hemoglobin A1C: 14 % — AB (ref 4.0–5.6)

## 2019-07-28 MED ORDER — METFORMIN HCL 1000 MG PO TABS: 1000.0000 mg | ORAL_TABLET | Freq: Two times a day (BID) | ORAL | 0 refills | Status: DC

## 2019-07-28 MED ORDER — GLIPIZIDE 10 MG PO TABS: ORAL_TABLET | ORAL | 0 refills | Status: DC

## 2019-07-28 NOTE — Patient Instructions (Addendum)
Week one: metformin 500 mg in the morning and at night.  Week two: metformin 1000 mg (two tabs) in the morning and then one tab 500 mg at night Week three: metformin 1000 mg in the morning and the night  Last vision exam by [employer]

## 2019-07-28 NOTE — Telephone Encounter (Signed)
[facility] faxed refill request for the following medications:  glipiZIDE (GLUCOTROL) 10 MG tablet   Please advise.

## 2019-07-29 ENCOUNTER — Telehealth: Payer: Self-pay

## 2019-07-29 LAB — CBC WITH DIFFERENTIAL/PLATELET
Basophils Absolute: 0 10*3/uL (ref 0.0–0.2)
Basos: 1 %
EOS (ABSOLUTE): 0 10*3/uL (ref 0.0–0.4)
Eos: 1 %
Hematocrit: 41 % (ref 34.0–46.6)
Hemoglobin: 13.1 g/dL (ref 11.1–15.9)
Immature Grans (Abs): 0 10*3/uL (ref 0.0–0.1)
Immature Granulocytes: 1 %
Lymphocytes Absolute: 0.8 10*3/uL (ref 0.7–3.1)
Lymphs: 19 %
MCH: 26.8 pg (ref 26.6–33.0)
MCHC: 32 g/dL (ref 31.5–35.7)
MCV: 84 fL (ref 79–97)
Monocytes Absolute: 0.2 10*3/uL (ref 0.1–0.9)
Monocytes: 5 %
Neutrophils Absolute: 3 10*3/uL (ref 1.4–7.0)
Neutrophils: 73 %
Platelets: 62 10*3/uL — CL (ref 150–450)
RBC: 4.89 x10E6/uL (ref 3.77–5.28)
RDW: 13.3 % (ref 11.7–15.4)
WBC: 4 10*3/uL (ref 3.4–10.8)

## 2019-07-29 LAB — COMPREHENSIVE METABOLIC PANEL
ALT: 27 IU/L (ref 0–32)
AST: 23 IU/L (ref 0–40)
Albumin/Globulin Ratio: 1.7 (ref 1.2–2.2)
Albumin: 4.3 g/dL (ref 3.8–4.9)
Alkaline Phosphatase: 127 IU/L — ABNORMAL HIGH (ref 39–117)
BUN/Creatinine Ratio: 13 (ref 9–23)
BUN: 11 mg/dL (ref 6–24)
Bilirubin Total: 0.6 mg/dL (ref 0.0–1.2)
CO2: 25 mmol/L (ref 20–29)
Calcium: 9.4 mg/dL (ref 8.7–10.2)
Chloride: 95 mmol/L — ABNORMAL LOW (ref 96–106)
Creatinine, Ser: 0.83 mg/dL (ref 0.57–1.00)
GFR calc Af Amer: 94 mL/min/{1.73_m2} (ref >59)
GFR calc non Af Amer: 82 mL/min/{1.73_m2} (ref >59)
Globulin, Total: 2.6 g/dL (ref 1.5–4.5)
Glucose: 433 mg/dL — ABNORMAL HIGH (ref 65–99)
Potassium: 4.7 mmol/L (ref 3.5–5.2)
Sodium: 135 mmol/L (ref 134–144)
Total Protein: 6.9 g/dL (ref 6.0–8.5)

## 2019-07-29 LAB — LIPID PANEL
Chol/HDL Ratio: 5.4 ratio — ABNORMAL HIGH (ref 0.0–4.4)
Cholesterol, Total: 140 mg/dL (ref 100–199)
HDL: 26 mg/dL — ABNORMAL LOW (ref >39)
LDL Chol Calc (NIH): 42 mg/dL (ref 0–99)
Triglycerides: 495 mg/dL — ABNORMAL HIGH (ref 0–149)
VLDL Cholesterol Cal: 72 mg/dL — ABNORMAL HIGH (ref 5–40)

## 2019-07-29 LAB — LDL CHOLESTEROL, DIRECT: LDL Direct: 37 mg/dL (ref 0–99)

## 2019-07-29 LAB — TSH: TSH: 1.51 u[IU]/mL (ref 0.450–4.500)

## 2019-07-29 NOTE — Telephone Encounter (Signed)
-----   Message from Trinna Post, [place] sent at 07/29/2019 10:23 AM EDT ----- Sugars elevated, expected for how high her A1c is. Kidney and liver function are fine. Her platelets are low and have been for the past 5 years. Has she been evaluated by hematology? If not, I'd recommend she go. Cholesterol well controlled, Thyroid normal.

## 2019-07-29 NOTE — Telephone Encounter (Signed)
Patient advised as below. Patient refused referral for hematology for now.

## 2019-08-11 ENCOUNTER — Ambulatory Visit: Payer: Self-pay | Admitting: *Deleted

## 2019-08-11 DIAGNOSIS — E1165 Type 2 diabetes mellitus with hyperglycemia: Secondary | ICD-10-CM

## 2019-08-11 NOTE — Patient Instructions (Signed)
  Visit Information  Goals Addressed            This Visit's Progress   . "I would like to have more information about what to eat to control my diabetes" (pt-stated)       Current Barriers:  Marland Kitchen Knowledge Deficits related to DMII self management  Nurse Case Manager Clinical Goal(s):  Marland Kitchen Over the next 30 days, patient will verbalize understanding of plan for self health management related to DMII . Over the next 60 days, patient will work with [facility] to address needs related to DMII management, particularly dietary education  Interventions:  . Evaluation of current treatment plan related to DMII and patient's adherence to plan as established by provider. . Advised patient to return a call to RNM at her earliest convenience to discuss dietary measures for management of DMII  Patient Self Care Activities:  . Self administers medications as prescribed . Attends all scheduled provider appointments . Calls pharmacy for medication refills . Performs ADL's independently . Performs IADL's independently . Calls provider office for new concerns or questions . Unable to independently self manage DMII  Initial goal documentation        Ms. Moothart was given information about Care Management services today including:  1. Care Management services include personalized support from designated clinical staff supervised by her physician, including individualized plan of care and coordination with other care providers 2. 24/7 contact phone numbers for assistance for urgent and routine care needs. 3. The patient may stop CCM services at any time (effective at the end of the month) by phone call to the office staff.  Patient agreed to services and verbal consent obtained.   The patient verbalized understanding of instructions provided today and declined a print copy of patient instruction materials.   The care management team will reach out to the patient again over the next 14 days.   [facility] [phone]

## 2019-08-11 NOTE — Chronic Care Management (AMB) (Signed)
Care Management   Initial Visit Note  08/11/2019 Name: Cassandra Butler MRN: [id number] DOB: Jul 09, 1966  Subjective: "I need help with my diabetes"  Objective:  Lab Results  Component Value Date   HGBA1C 14.0 (A) 07/28/2019   HGBA1C 12.2 (A) 01/05/2019   HGBA1C 9.0 03/24/2017   Lab Results  Component Value Date   LDLCALC 42 07/28/2019   CREATININE 0.83 07/28/2019    Assessment: TAMIKI KUBA is a 53 y.o. year old female who sees Trinna Post, [place] for primary care. The care management team was consulted for assistance with care management and care coordination needs related to Disease Management Educational Needs related to DMII.   Review of patient status, including review of consultants reports, relevant laboratory and other test results, and collaboration with appropriate care team members and the patient's provider was performed as part of comprehensive patient evaluation and provision of care management services.    SDOH (Social Determinants of Health) screening performed today: None (brief telephone encounter for engagement). See Care Plan for related entries.   Advanced Directives: N See Care Plan and Vynca application for related entries.   Outpatient Encounter Medications as of 08/11/2019  Medication Sig Note  . atorvastatin (LIPITOR) 80 MG tablet TAKE 1 TABLET BY MOUTH DAILY   . BAYER CONTOUR NEXT TEST test strip CHECK SUGAR D 02/29/2016: Received from: External Pharmacy  . blood glucose meter kit and supplies KIT Check sugar daily   . Cholecalciferol (VITAMIN D3) 2000 units TABS Take by mouth.   Marland Kitchen glipiZIDE (GLUCOTROL) 10 MG tablet TAKE 1 TABLET BY MOUTH TWICE DAILY BEFORE MEALS( 30 MINUTES PRIOR TO EATING)   . ibuprofen (ADVIL,MOTRIN) 200 MG tablet Take 200 mg by mouth every 6 (six) hours as needed.   Marland Kitchen lisinopril (PRINIVIL,ZESTRIL) 10 MG tablet Take 1 tablet (10 mg total) by mouth daily.   . metFORMIN (GLUCOPHAGE) 1000 MG tablet Take 1 tablet (1,000 mg  total) by mouth 2 (two) times daily with a meal.   . MICROLET LANCETS MISC CHECK SUGAR D 02/29/2016: Received from: External Pharmacy  . nystatin-triamcinolone ointment (MYCOLOG) Apply 1 application topically 2 (two) times daily.   . Omega-3 Fatty Acids (FISH OIL) 1000 MG CAPS Take by mouth daily.   Marland Kitchen omeprazole (PRILOSEC) 20 MG capsule TAKE 1 CAPSULE(20 MG) BY MOUTH DAILY   . sertraline (ZOLOFT) 100 MG tablet Take 1 tablet (100 mg total) by mouth daily.    No facility-administered encounter medications on file as of 08/11/2019.     Goals Addressed            This Visit's Progress   . "I would like to have more information about what to eat to control my diabetes" (pt-stated)       Current Barriers:  Marland Kitchen Knowledge Deficits related to DMII self management  Nurse Case Manager Clinical Goal(s):  Marland Kitchen Over the next 30 days, patient will verbalize understanding of plan for self health management related to DMII . Over the next 60 days, patient will work with [facility] to address needs related to DMII management, particularly dietary education  Interventions:  . Evaluation of current treatment plan related to DMII and patient's adherence to plan as established by provider. . Advised patient to return a call to RNM at her earliest convenience to discuss dietary measures for management of DMII  Patient Self Care Activities:  . Self administers medications as prescribed . Attends all scheduled provider appointments . Calls pharmacy for medication refills . Performs  ADL's independently . Performs IADL's independently . Calls provider office for new concerns or questions . Unable to independently self manage DMII  Initial goal documentation         Follow up plan:  The care management team will reach out to the patient again over the next 14 days.   Ms. Karnik was given information about Care Management services today including:  1. Care Management services include personalized support from  designated clinical staff supervised by a physician, including individualized plan of care and coordination with other care providers 2. 24/7 contact phone numbers for assistance for urgent and routine care needs. 3. The patient may stop Care Management services at any time (effective at the end of the month) by phone call to the office staff.  Patient agreed to services and verbal consent obtained.  [facility] [phone]

## 2019-08-13 ENCOUNTER — Telehealth: Payer: Self-pay

## 2019-08-13 NOTE — Telephone Encounter (Signed)
lmtcb to advise of need for diabetic eye exam. Her last one was done 07/31/2017 at [facility].

## 2019-08-18 NOTE — Telephone Encounter (Signed)
lmtcb

## 2019-08-25 ENCOUNTER — Other Ambulatory Visit: Payer: Self-pay | Admitting: Physician Assistant

## 2019-08-25 DIAGNOSIS — K219 Gastro-esophageal reflux disease without esophagitis: Secondary | ICD-10-CM

## 2019-08-25 MED ORDER — OMEPRAZOLE 20 MG PO CPDR: DELAYED_RELEASE_CAPSULE | ORAL | 2 refills | Status: DC

## 2019-08-25 NOTE — Telephone Encounter (Addendum)
[facility] faxed refill request for the following medications:  omeprazole (PRILOSEC) 20 MG capsule     Please advise.  

## 2019-08-25 NOTE — Telephone Encounter (Signed)
Farris Has which medicine did she need?  Thanks,   -Mickel Baas

## 2019-08-31 ENCOUNTER — Other Ambulatory Visit: Payer: Self-pay

## 2019-08-31 ENCOUNTER — Ambulatory Visit: Payer: BC Managed Care – PPO | Admitting: Physician Assistant

## 2019-08-31 ENCOUNTER — Encounter: Payer: Self-pay | Admitting: Physician Assistant

## 2019-08-31 VITALS — BP 134/86 | HR 88 | Temp 96.6°F | Resp 16 | Wt 165.8 lb

## 2019-08-31 DIAGNOSIS — E1159 Type 2 diabetes mellitus with other circulatory complications: Secondary | ICD-10-CM | POA: Diagnosis not present

## 2019-08-31 DIAGNOSIS — E1169 Type 2 diabetes mellitus with other specified complication: Secondary | ICD-10-CM | POA: Diagnosis not present

## 2019-08-31 DIAGNOSIS — E1165 Type 2 diabetes mellitus with hyperglycemia: Secondary | ICD-10-CM

## 2019-08-31 DIAGNOSIS — I152 Hypertension secondary to endocrine disorders: Secondary | ICD-10-CM

## 2019-08-31 DIAGNOSIS — E785 Hyperlipidemia, unspecified: Secondary | ICD-10-CM | POA: Diagnosis not present

## 2019-08-31 DIAGNOSIS — I10 Essential (primary) hypertension: Secondary | ICD-10-CM

## 2019-08-31 LAB — POCT GLYCOSYLATED HEMOGLOBIN (HGB A1C): Hemoglobin A1C: 10.6 % — AB (ref 4.0–5.6)

## 2019-08-31 NOTE — Progress Notes (Signed)
Patient: Cassandra Butler Female    DOB: 09-03-1966   53 y.o.   MRN: [id number] Visit Date: 08/31/2019  Today's Provider: Trinna Post, PA-C   Chief Complaint  Patient presents with  . Diabetes   Subjective:     HPI  Diabetes Mellitus Type II, Follow-up:   Lab Results  Component Value Date   HGBA1C 10.6 (A) 08/31/2019   HGBA1C 14.0 (A) 07/28/2019   HGBA1C 12.2 (A) 01/05/2019    Last seen for diabetes 1 months ago.  Management since then includes start patient on Metformin 1084m, continue taking Glipizide BID and refer patient to CCM for DM education. She reports she missed call from CCM but has attended lifestyle classes years back for diabetes.  She reports good compliance with treatment. She is having side effects. Diarrhea 5 times per day. Current symptoms include Feeling jittery with lower blood sugars and have been stable. Home blood sugar records: fasting range: Decreasing from 568 down to 210's  Episodes of hypoglycemia? no   Current insulin regiment: Is not on insulin Most Recent Eye Exam: past due Weight trend: stable Prior visit with dietician: No Current exercise: none Current diet habits: in general, an "unhealthy" diet. She is trying to stay away from bread, pasta, and carbs.   Pertinent Labs:    Component Value Date/Time   CHOL 140 07/28/2019 0906   TRIG 495 (H) 07/28/2019 0906   HDL 26 (L) 07/28/2019 0906   LDLCALC 42 07/28/2019 0906   CREATININE 0.83 07/28/2019 0906   CREATININE 0.88 02/17/2015 1423    Wt Readings from Last 3 Encounters:  08/31/19 165 lb 12.8 oz (75.2 kg)  07/28/19 165 lb 3.2 oz (74.9 kg)  01/05/19 171 lb (77.6 kg)   HTN: She is currently taking lisinopril 10 mg daily without issue.   BP Readings from Last 3 Encounters:  08/31/19 134/86  07/28/19 120/70  01/05/19 140/82   HLD: She is taking lipitor 80 mg daily without issue.   Lipid Panel     Component Value Date/Time   CHOL 140 07/28/2019 0906   TRIG  495 (H) 07/28/2019 0906   HDL 26 (L) 07/28/2019 0906   CHOLHDL 5.4 (H) 07/28/2019 0906   LDLCALC 42 07/28/2019 0906   LDLDIRECT 37 07/28/2019 0906   LABVLDL 72 (H) 07/28/2019 0906    ------------------------------------------------------------------------  No Known Allergies   Current Outpatient Medications:  .  atorvastatin (LIPITOR) 80 MG tablet, TAKE 1 TABLET BY MOUTH DAILY, Disp: 90 tablet, Rfl: 3 .  BAYER CONTOUR NEXT TEST test strip, CHECK SUGAR D, Disp: , Rfl: 0 .  blood glucose meter kit and supplies KIT, Check sugar daily, Disp: 1 each, Rfl: 0 .  Cholecalciferol (VITAMIN D3) 2000 units TABS, Take by mouth., Disp: , Rfl:  .  glipiZIDE (GLUCOTROL) 10 MG tablet, TAKE 1 TABLET BY MOUTH TWICE DAILY BEFORE MEALS( 30 MINUTES PRIOR TO EATING), Disp: 180 tablet, Rfl: 0 .  ibuprofen (ADVIL,MOTRIN) 200 MG tablet, Take 200 mg by mouth every 6 (six) hours as needed., Disp: , Rfl:  .  lisinopril (PRINIVIL,ZESTRIL) 10 MG tablet, Take 1 tablet (10 mg total) by mouth daily., Disp: 90 tablet, Rfl: 3 .  metFORMIN (GLUCOPHAGE) 1000 MG tablet, Take 1 tablet (1,000 mg total) by mouth 2 (two) times daily with a meal., Disp: 180 tablet, Rfl: 0 .  MICROLET LANCETS MISC, CHECK SUGAR D, Disp: , Rfl: 0 .  nystatin ointment (MYCOSTATIN), APP EXT AA BID, Disp: ,  Rfl:  .  nystatin-triamcinolone ointment (MYCOLOG), Apply 1 application topically 2 (two) times daily., Disp: 30 g, Rfl: 1 .  Omega-3 Fatty Acids (FISH OIL) 1000 MG CAPS, Take by mouth daily., Disp: , Rfl:  .  omeprazole (PRILOSEC) 20 MG capsule, TAKE 1 CAPSULE(20 MG) BY MOUTH DAILY, Disp: 30 capsule, Rfl: 2 .  sertraline (ZOLOFT) 100 MG tablet, Take 1 tablet (100 mg total) by mouth daily., Disp: 90 tablet, Rfl: 3 .  triamcinolone ointment (KENALOG) 0.1 %, APPLY EXTERNALLY TO THE AFFECTED AREA BID, Disp: , Rfl:   Review of Systems  Social History   Tobacco Use  . Smoking status: Current Every Day Smoker    Packs/day: 1.00    Types: Cigarettes   . Smokeless tobacco: Never Used  Substance Use Topics  . Alcohol use: No      Objective:   BP 134/86   Pulse 88   Temp (!) 96.6 F (35.9 C) (Oral)   Resp 16   Wt 165 lb 12.8 oz (75.2 kg)   BMI 26.76 kg/m  Vitals:   08/31/19 0821 08/31/19 0843  BP: (!) 145/90 134/86  Pulse: 88   Resp: 16   Temp: (!) 96.6 F (35.9 C)   TempSrc: Oral   Weight: 165 lb 12.8 oz (75.2 kg)   Body mass index is 26.76 kg/m.   Physical Exam Constitutional:      Appearance: Normal appearance.  Cardiovascular:     Rate and Rhythm: Normal rate and regular rhythm.  Pulmonary:     Effort: Pulmonary effort is normal.     Breath sounds: Normal breath sounds.  Skin:    General: Skin is warm and dry.  Neurological:     Mental Status: She is alert and oriented to person, place, and time. Mental status is at baseline.  Psychiatric:        Mood and Affect: Mood normal.        Behavior: Behavior normal.      Results for orders placed or performed in visit on 08/31/19  POCT glycosylated hemoglobin (Hb A1C)  Result Value Ref Range   Hemoglobin A1C 10.6 (A) 4.0 - 5.6 %   HbA1c POC (<> result, manual entry)     HbA1c, POC (prediabetic range)     HbA1c, POC (controlled diabetic range)         Assessment & Plan    1. Type 2 diabetes mellitus with hyperglycemia, without long-term current use of insulin (HCC)  Offered to change metformin to XR but patient would like to wait until next visit. A1c improved today to 10.6%. Keep taking medications and follow up in 2 months. Keep working on lifestyle changes and reducing sugar. Eye exam scheduled at [facility] on 09/28/2019.   - POCT glycosylated hemoglobin (Hb A1C)  2. Hyperlipidemia associated with type 2 diabetes mellitus (HCC)  Continue Lipitor 80 mg daily.   3. Hypertension associated with diabetes (HCC)  Continue Lisinopril 10 mg daily.   The entirety of the information documented in the History of Present Illness, Review of Systems and  Physical Exam were personally obtained by me. Portions of this information were initially documented by Jennings Books, CMA and reviewed by me for thoroughness and accuracy.   F/u 2 months DM      Trinna Post, PA-C  [facility]

## 2019-09-01 ENCOUNTER — Other Ambulatory Visit: Payer: Self-pay | Admitting: Physician Assistant

## 2019-09-01 MED ORDER — ATORVASTATIN CALCIUM 80 MG PO TABS: 80.0000 mg | ORAL_TABLET | Freq: Every day | ORAL | 3 refills | Status: DC

## 2019-09-01 NOTE — Telephone Encounter (Signed)
[facility] faxed refill request for the following medications:   atorvastatin (LIPITOR) 80 MG tablet   Please advise.  

## 2019-09-28 DIAGNOSIS — E119 Type 2 diabetes mellitus without complications: Secondary | ICD-10-CM | POA: Diagnosis not present

## 2019-09-28 DIAGNOSIS — H5213 Myopia, bilateral: Secondary | ICD-10-CM | POA: Diagnosis not present

## 2019-09-28 LAB — HM DIABETES EYE EXAM

## 2019-10-12 ENCOUNTER — Emergency Department: Payer: BC Managed Care – PPO

## 2019-10-12 ENCOUNTER — Encounter: Payer: Self-pay | Admitting: *Deleted

## 2019-10-12 ENCOUNTER — Other Ambulatory Visit: Payer: Self-pay

## 2019-10-12 ENCOUNTER — Emergency Department
Admission: EM | Admit: 2019-10-12 | Discharge: 2019-10-12 | Disposition: A | Discharge status: Home / Self Care | Payer: BC Managed Care – PPO | Attending: Emergency Medicine | Admitting: Emergency Medicine

## 2019-10-12 DIAGNOSIS — I10 Essential (primary) hypertension: Secondary | ICD-10-CM | POA: Diagnosis not present

## 2019-10-12 DIAGNOSIS — Z79899 Other long term (current) drug therapy: Secondary | ICD-10-CM | POA: Insufficient documentation

## 2019-10-12 DIAGNOSIS — N12 Tubulo-interstitial nephritis, not specified as acute or chronic: Secondary | ICD-10-CM | POA: Insufficient documentation

## 2019-10-12 DIAGNOSIS — R399 Unspecified symptoms and signs involving the genitourinary system: Secondary | ICD-10-CM | POA: Diagnosis not present

## 2019-10-12 DIAGNOSIS — Z7984 Long term (current) use of oral hypoglycemic drugs: Secondary | ICD-10-CM | POA: Insufficient documentation

## 2019-10-12 DIAGNOSIS — Z853 Personal history of malignant neoplasm of breast: Secondary | ICD-10-CM | POA: Diagnosis not present

## 2019-10-12 DIAGNOSIS — N390 Urinary tract infection, site not specified: Secondary | ICD-10-CM | POA: Diagnosis not present

## 2019-10-12 DIAGNOSIS — E119 Type 2 diabetes mellitus without complications: Secondary | ICD-10-CM | POA: Diagnosis not present

## 2019-10-12 DIAGNOSIS — F17210 Nicotine dependence, cigarettes, uncomplicated: Secondary | ICD-10-CM | POA: Insufficient documentation

## 2019-10-12 DIAGNOSIS — N1330 Unspecified hydronephrosis: Secondary | ICD-10-CM | POA: Diagnosis not present

## 2019-10-12 DIAGNOSIS — R109 Unspecified abdominal pain: Secondary | ICD-10-CM | POA: Diagnosis not present

## 2019-10-12 DIAGNOSIS — R1030 Lower abdominal pain, unspecified: Secondary | ICD-10-CM | POA: Diagnosis present

## 2019-10-12 LAB — URINALYSIS, COMPLETE (UACMP) WITH MICROSCOPIC
Bilirubin Urine: NEGATIVE
Glucose, UA: NEGATIVE mg/dL
Ketones, ur: NEGATIVE mg/dL
Nitrite: POSITIVE — AB
Protein, ur: 100 mg/dL — AB
RBC / HPF: >50 RBC/hpf — ABNORMAL HIGH (ref 0–5)
Specific Gravity, Urine: 1.014 (ref 1.005–1.030)
WBC, UA: >50 WBC/hpf — ABNORMAL HIGH (ref 0–5)
pH: 6 (ref 5.0–8.0)

## 2019-10-12 LAB — COMPREHENSIVE METABOLIC PANEL
ALT: 25 U/L (ref 0–44)
AST: 19 U/L (ref 15–41)
Albumin: 4.2 g/dL (ref 3.5–5.0)
Alkaline Phosphatase: 87 U/L (ref 38–126)
Anion gap: 14 (ref 5–15)
BUN: 11 mg/dL (ref 6–20)
CO2: 23 mmol/L (ref 22–32)
Calcium: 9.3 mg/dL (ref 8.9–10.3)
Chloride: 102 mmol/L (ref 98–111)
Creatinine, Ser: 0.65 mg/dL (ref 0.44–1.00)
GFR calc Af Amer: >60 mL/min (ref >60)
GFR calc non Af Amer: >60 mL/min (ref >60)
Glucose, Bld: 166 mg/dL — ABNORMAL HIGH (ref 70–99)
Potassium: 3.8 mmol/L (ref 3.5–5.1)
Sodium: 139 mmol/L (ref 135–145)
Total Bilirubin: 0.7 mg/dL (ref 0.3–1.2)
Total Protein: 7.7 g/dL (ref 6.5–8.1)

## 2019-10-12 LAB — CBC
HCT: 37.2 % (ref 36.0–46.0)
Hemoglobin: 12.4 g/dL (ref 12.0–15.0)
MCH: 27.7 pg (ref 26.0–34.0)
MCHC: 33.3 g/dL (ref 30.0–36.0)
MCV: 83 fL (ref 80.0–100.0)
Platelets: 81 10*3/uL — ABNORMAL LOW (ref 150–400)
RBC: 4.48 MIL/uL (ref 3.87–5.11)
RDW: 13.6 % (ref 11.5–15.5)
WBC: 5.4 10*3/uL (ref 4.0–10.5)
nRBC: 0 % (ref 0.0–0.2)

## 2019-10-12 LAB — LIPASE, BLOOD: Lipase: 25 U/L (ref 11–51)

## 2019-10-12 MED ORDER — FLUCONAZOLE 150 MG PO TABS: 150.0000 mg | ORAL_TABLET | Freq: Once | ORAL | 0 refills | Status: AC

## 2019-10-12 MED ORDER — ONDANSETRON HCL 4 MG/2ML IJ SOLN: 4.0000 mg | Freq: Once | INTRAMUSCULAR | Status: DC

## 2019-10-12 MED ORDER — SODIUM CHLORIDE 0.9 % IV SOLN
2.0000 g | Freq: Once | INTRAVENOUS | Status: AC
  Administered 2019-10-12: 2 g via INTRAVENOUS
  Filled 2019-10-12: qty 20

## 2019-10-12 MED ORDER — ONDANSETRON HCL 4 MG/2ML IJ SOLN
4.0000 mg | Freq: Once | INTRAMUSCULAR | Status: AC
  Administered 2019-10-12: 4 mg via INTRAVENOUS
  Filled 2019-10-12: qty 2

## 2019-10-12 MED ORDER — MORPHINE SULFATE (PF) 4 MG/ML IV SOLN: 4.0000 mg | Freq: Once | INTRAVENOUS | Status: DC

## 2019-10-12 MED ORDER — ONDANSETRON 4 MG PO TBDP: 4.0000 mg | ORAL_TABLET | Freq: Three times a day (TID) | ORAL | 0 refills | Status: DC | PRN

## 2019-10-12 MED ORDER — SODIUM CHLORIDE 0.9 % IV BOLUS
1000.0000 mL | Freq: Once | INTRAVENOUS | Status: AC
  Administered 2019-10-12: 1000 mL via INTRAVENOUS

## 2019-10-12 MED ORDER — HYDROCODONE-ACETAMINOPHEN 5-325 MG PO TABS: 1.0000 | ORAL_TABLET | Freq: Four times a day (QID) | ORAL | 0 refills | Status: DC | PRN

## 2019-10-12 MED ORDER — CEFPODOXIME PROXETIL 200 MG PO TABS: 200.0000 mg | ORAL_TABLET | Freq: Two times a day (BID) | ORAL | 0 refills | Status: AC

## 2019-10-12 NOTE — ED Triage Notes (Signed)
First Nurse Note:  Arrives from [facility] for evaluation of Right flank pain.  Patient is AAOx3.  Skin warm and dry. NAD

## 2019-10-12 NOTE — ED Notes (Signed)
Pt states she wants to hold on the zofran and morphine for now, will keep active just in case she changes her mind.

## 2019-10-12 NOTE — Discharge Instructions (Addendum)
As we discussed, return to the ER IMMEDIATELY if you develop:  - Worsening nausea with vomiting - Fevers with temp >100.81F - Intractable pain or nausea with vomiting - Persistent pain and nausea >48 hours after being on antibiotics - Confusion, sleepiness - Decreased urine output

## 2019-10-12 NOTE — ED Triage Notes (Signed)
Patient was sent here from [facility] clinic for right flank pain that extends to lower abdomen. Patient only has right kidney and had positive nitrites, protein and WBC and RBC in urine. Patient states pain started yesterday and worsened today. Patient c/o pain after urination.

## 2019-10-12 NOTE — ED Notes (Signed)
Pt taken for renal US.

## 2019-10-12 NOTE — ED Provider Notes (Signed)
[facility] Emergency Department Provider Note  ____________________________________________   First MD Initiated Contact with Patient 10/12/19 1755     (approximate)  I have reviewed the triage vital signs and the nursing notes.   HISTORY  Chief Complaint Flank Pain    HPI Cassandra Butler is a 53 y.o. female  Here with R flank pain, suprapubic pain. Pt reports that her sx started 3-4 days ago as a mild burning, suprapubic pain with urination. At the time, she thought it was due to dehydration and drinking mountain dew to much. Over the past 24 hours, she's now developed aching, throbbing R flank pain with associated chills. She's had nausea, poor appetite but no vomiting. No constipation or diarrhea. No known fevers but has had chills. No rigors. No vaginal bleeding or discharge. No h/o kidney stones, UTIs. Of note, she has a solitary functioning kidney ont he R. Pain is worse with movement, palpation. No alleviating factors.       Past Medical History:  Diagnosis Date  . Cancer ([place]) 11/14/2007   Left breast wide excision, sentinel node biopsy. 1.4 cm, T1c, N0: ER 90%; PR 905; Her 2 neu not over expressed.   . Diabetes mellitus without complication ([place])   . GERD (gastroesophageal reflux disease)   . Hyperlipidemia   . Hypertension   . Vitamin D deficiency     Patient Active Problem List   Diagnosis Date Noted  . Osteopenia of multiple sites 10/08/2016  . Aromatase inhibitor use 10/08/2016  . Type 2 diabetes mellitus with hyperglycemia ([place]) 02/01/2016  . Breast CA ([place]) 04/04/2015  . HLD (hyperlipidemia) 04/04/2015  . Hypertriglyceridemia 04/04/2015  . Obstructive sleep apnea of adult 04/04/2015  . Malignant neoplasm of breast ([place]) 05/18/2012  . Avitaminosis D 02/14/2010  . Essential (primary) hypertension 08/07/2009  . Family history of cardiovascular disease 02/09/2008  . Current tobacco use 01/27/2008  . Anxiety disorder 12/18/2004  .  Acid reflux 12/18/2004    Past Surgical History:  Procedure Laterality Date  . ABDOMINAL HYSTERECTOMY  2004   total, due to fibriods. Also had ovaries removed after 1 year of breast cancer.   [facility] BREAST LUMPECTOMY Left 2010  . COLONOSCOPY     25 yrs ago  . COLONOSCOPY  04/16/2010   Dr Candace Cruise    Prior to Admission medications   Medication Sig Start Date End Date Taking? Authorizing Provider  atorvastatin (LIPITOR) 80 MG tablet Take 1 tablet (80 mg total) by mouth daily. 09/01/19   Trinna Post, PA-C  BAYER CONTOUR NEXT TEST test strip CHECK SUGAR D 02/01/16   [provider]  blood glucose meter kit and supplies KIT Check sugar daily 08/04/17   Carmon Ginsberg, PA  cefpodoxime (VANTIN) 200 MG tablet Take 1 tablet (200 mg total) by mouth 2 (two) times daily for 10 days. 10/12/19 10/22/19  Duffy Bruce, MD  Cholecalciferol (VITAMIN D3) 2000 units TABS Take by mouth.    [provider]  glipiZIDE (GLUCOTROL) 10 MG tablet TAKE 1 TABLET BY MOUTH TWICE DAILY BEFORE MEALS( 30 MINUTES PRIOR TO EATING) 07/28/19   Trinna Post, PA-C  HYDROcodone-acetaminophen (NORCO/VICODIN) 5-325 MG tablet Take 1-2 tablets by mouth every 6 (six) hours as needed for moderate pain or severe pain. 10/12/19 10/11/20  Duffy Bruce, MD  ibuprofen (ADVIL,MOTRIN) 200 MG tablet Take 200 mg by mouth every 6 (six) hours as needed.    [provider]  lisinopril (PRINIVIL,ZESTRIL) 10 MG tablet Take 1 tablet (  10 mg total) by mouth daily. 01/05/19   Carmon Ginsberg, PA  metFORMIN (GLUCOPHAGE) 1000 MG tablet Take 1 tablet (1,000 mg total) by mouth 2 (two) times daily with a meal. 07/28/19   Trinna Post, PA-C  MICROLET LANCETS MISC CHECK SUGAR D 02/01/16   [provider]  nystatin ointment (MYCOSTATIN) APP EXT AA BID 07/28/19   [provider]  nystatin-triamcinolone ointment (MYCOLOG) Apply 1 application topically 2 (two) times daily. 01/05/19   Carmon Ginsberg, PA  Omega-3  Fatty Acids (FISH OIL) 1000 MG CAPS Take by mouth daily.    [provider]  omeprazole (PRILOSEC) 20 MG capsule TAKE 1 CAPSULE(20 MG) BY MOUTH DAILY 08/25/19   Carles Collet M, PA-C  ondansetron (ZOFRAN ODT) 4 MG disintegrating tablet Take 1 tablet (4 mg total) by mouth every 8 (eight) hours as needed for nausea or vomiting. 10/12/19   Duffy Bruce, MD  sertraline (ZOLOFT) 100 MG tablet Take 1 tablet (100 mg total) by mouth daily. 01/05/19   Carmon Ginsberg, PA  triamcinolone ointment (KENALOG) 0.1 % APPLY EXTERNALLY TO THE AFFECTED AREA BID 07/28/19   [provider]    Allergies Patient has no known allergies.  Family History  Problem Relation Age of Onset  . Diabetes Mother     Social History Social History   Tobacco Use  . Smoking status: Current Every Day Smoker    Packs/day: 1.00    Types: Cigarettes  . Smokeless tobacco: Never Used  Substance Use Topics  . Alcohol use: No  . Drug use: No    Review of Systems  Review of Systems  Constitutional: Positive for fatigue. Negative for chills and fever.  HENT: Negative for congestion and sore throat.   Eyes: Negative for visual disturbance.  Respiratory: Negative for cough and shortness of breath.   Cardiovascular: Negative for chest pain.  Gastrointestinal: Negative for abdominal pain, diarrhea, nausea and vomiting.  Genitourinary: Positive for dysuria, flank pain, frequency and hematuria.  Musculoskeletal: Negative for back pain and neck pain.  Skin: Negative for rash and wound.  Allergic/Immunologic: Negative for immunocompromised state.  Neurological: Negative for weakness and numbness.  Hematological: Does not bruise/bleed easily.  All other systems reviewed and are negative.    ____________________________________________  PHYSICAL EXAM:      VITAL SIGNS: ED Triage Vitals  Enc Vitals Group     BP 10/12/19 1449 138/83     Pulse Rate 10/12/19 1449 75     Resp 10/12/19 1449 18     Temp  10/12/19 1449 99.2 F (37.3 C)     Temp Source 10/12/19 1449 Oral     SpO2 10/12/19 1449 99 %     Weight 10/12/19 1453 165 lb (74.8 kg)     Height 10/12/19 1453 '5\' 6"'  (1.676 m)     Head Circumference --      Peak Flow --      Pain Score 10/12/19 1453 7     Pain Loc --      Pain Edu? --      Excl. in [place]? --      Physical Exam Vitals signs and nursing note reviewed.  Constitutional:      General: She is not in acute distress.    Appearance: She is well-developed.  HENT:     Head: Normocephalic and atraumatic.  Eyes:     Conjunctiva/sclera: Conjunctivae normal.  Neck:     Musculoskeletal: Neck supple.  Cardiovascular:     Rate and  Rhythm: Normal rate and regular rhythm.     Heart sounds: Normal heart sounds. No murmur. No friction rub.  Pulmonary:     Effort: Pulmonary effort is normal. No respiratory distress.     Breath sounds: Normal breath sounds. No wheezing or rales.  Abdominal:     General: There is no distension.     Palpations: Abdomen is soft.     Tenderness: There is no abdominal tenderness.     Comments: R CVAT. Mild SP tenderness without guarding or rebound.  Skin:    General: Skin is warm.     Capillary Refill: Capillary refill takes less than 2 seconds.  Neurological:     Mental Status: She is alert and oriented to person, place, and time.     Motor: No abnormal muscle tone.       ____________________________________________   LABS (all labs ordered are listed, but only abnormal results are displayed)  Labs Reviewed  COMPREHENSIVE METABOLIC PANEL - Abnormal; Notable for the following components:      Result Value   Glucose, Bld 166 (*)    All other components within normal limits  CBC - Abnormal; Notable for the following components:   Platelets 81 (*)    All other components within normal limits  URINALYSIS, COMPLETE (UACMP) WITH MICROSCOPIC - Abnormal; Notable for the following components:   Color, Urine YELLOW (*)    APPearance CLOUDY (*)     Hgb urine dipstick LARGE (*)    Protein, ur 100 (*)    Nitrite POSITIVE (*)    Leukocytes,Ua MODERATE (*)    RBC / HPF >50 (*)    WBC, UA >50 (*)    Bacteria, UA RARE (*)    All other components within normal limits  CULTURE, BLOOD (ROUTINE X 2)  CULTURE, BLOOD (ROUTINE X 2)  URINE CULTURE  LIPASE, BLOOD    ____________________________________________  EKG: None ________________________________________  RADIOLOGY All imaging, including plain films, CT scans, and ultrasounds, independently reviewed by me, and interpretations confirmed via formal radiology reads.  ED MD interpretation:   [place]: Atrophic L kidney, no R hydro or signs of obstructions  Official radiology report(s): US Renal  Result Date: 10/12/2019 CLINICAL DATA:  Right-sided flank pain for 1 day EXAM: RENAL / URINARY TRACT ULTRASOUND COMPLETE COMPARISON:  07/28/2015 FINDINGS: Right Kidney: Renal measurements: 14.6 x 4.9 x 7.0 cm. = volume: 262 mL . Echogenicity within normal limits. No mass or hydronephrosis visualized. Left Kidney: Renal measurements: 11.6 x 4.7 x 4.4 cm = volume: 125 mL. Severe hydronephrosis is noted with severe cortical thinning similar to that seen on the prior CT examination. This persists following voiding Bladder: Appears normal for degree of bladder distention. Left ureteral jet is not well visualized. These changes are similar to that seen on prior CT examination. Other: None. IMPRESSION: Severely atrophic left kidney with hydronephrosis. This is related to longstanding obstruction as previously seen on CT. No other focal abnormality is noted. Electronically Signed   By: Inez Catalina M.D.   On: 10/12/2019 16:03    ____________________________________________  PROCEDURES   Procedure(s) performed (including Critical Care):  Procedures  ____________________________________________  INITIAL IMPRESSION / MDM / [place] / ED COURSE  As part of my medical decision making, I  reviewed the following data within the [place] notes reviewed and incorporated, Old chart reviewed, Notes from prior ED visits, and [place] J Bice was  evaluated in Emergency Department on 10/12/2019 for the symptoms described in the history of present illness. She was evaluated in the context of the global COVID-19 pandemic, which necessitated consideration that the patient might be at risk for infection with the SARS-CoV-2 virus that causes COVID-19. Institutional protocols and algorithms that pertain to the evaluation of patients at risk for COVID-19 are in a state of rapid change based on information released by regulatory bodies including the CDC and federal and state organizations. These policies and algorithms were followed during the patient's care in the ED.  Some ED evaluations and interventions may be delayed as a result of limited staffing during the pandemic.*     Medical Decision Making:  53 yo F here with R flank pain. Imaging, exam is c/w early pyelonephritis, without signs of sepsis. Normal WBC, normal VS and temp, with no signs of systemic infection. Renal function at baseline. Renal U/S is without signs of obstruction or new hydro. Plt low btu at baseline. Discussed diagnosis and concern for early pyelo, esp given solitary functioning kidney. Offered and recommended admission but pt feels better, would like to attempt outpt management. She received Rocephin IV and fluids. Given her o/w well appearance and based on shared decision making with pt, will d/c with outpt vantin and good return precautions. She understands risks of doing so, including sepsis, renal failure, dialysis,d eath.  ____________________________________________  FINAL CLINICAL IMPRESSION(S) / ED DIAGNOSES  Final diagnoses:  Pyelonephritis     MEDICATIONS GIVEN DURING THIS VISIT:  Medications  sodium chloride 0.9 % bolus 1,000 mL (0 mLs  Intravenous Stopped 10/12/19 1743)  sodium chloride 0.9 % bolus 1,000 mL (1,000 mLs Intravenous New Bag/Given 10/12/19 1831)  cefTRIAXone (ROCEPHIN) 2 g in sodium chloride 0.9 % 100 mL IVPB (2 g Intravenous New Bag/Given 10/12/19 1833)  ondansetron (ZOFRAN) injection 4 mg (4 mg Intravenous Given 10/12/19 1831)     ED Discharge Orders         Ordered    HYDROcodone-acetaminophen (NORCO/VICODIN) 5-325 MG tablet  Every 6 hours PRN     10/12/19 1931    ondansetron (ZOFRAN ODT) 4 MG disintegrating tablet  Every 8 hours PRN     10/12/19 1931    cefpodoxime (VANTIN) 200 MG tablet  2 times daily     10/12/19 1931           Note:  This document was prepared using Dragon voice recognition software and may include unintentional dictation errors.   Duffy Bruce, MD 10/12/19 [phone]

## 2019-10-12 NOTE — ED Notes (Signed)
See triage note    States she developed right flank pain which is moving into right side of abd    Pain started yesterday  But has progressed today    Sent in by [facility]

## 2019-10-13 ENCOUNTER — Ambulatory Visit: Payer: BC Managed Care – PPO | Admitting: Physician Assistant

## 2019-10-15 LAB — URINE CULTURE
Culture: >=100000 — AB
Special Requests: NORMAL

## 2019-10-17 LAB — CULTURE, BLOOD (ROUTINE X 2)
Culture: NO GROWTH
Culture: NO GROWTH
Special Requests: ADEQUATE
Special Requests: ADEQUATE

## 2019-10-24 ENCOUNTER — Other Ambulatory Visit: Payer: Self-pay | Admitting: Physician Assistant

## 2019-10-24 DIAGNOSIS — E1165 Type 2 diabetes mellitus with hyperglycemia: Secondary | ICD-10-CM

## 2019-10-25 ENCOUNTER — Other Ambulatory Visit: Payer: Self-pay | Admitting: Physician Assistant

## 2019-10-25 DIAGNOSIS — E1165 Type 2 diabetes mellitus with hyperglycemia: Secondary | ICD-10-CM

## 2019-11-01 ENCOUNTER — Other Ambulatory Visit: Payer: Self-pay | Admitting: Physician Assistant

## 2019-11-01 DIAGNOSIS — K219 Gastro-esophageal reflux disease without esophagitis: Secondary | ICD-10-CM

## 2019-11-02 NOTE — Progress Notes (Signed)
Patient: Cassandra Butler Female    DOB: 06-15-1966   53 y.o.   MRN: [id number] Visit Date: 11/03/2019  Today's Provider: Trinna Post, PA-C   Chief Complaint  Patient presents with  . Follow-up  . Diabetes  . Hypertension  . Hyperlipidemia   Subjective:     HPI    Diabetes Mellitus Type II, Follow-up:   Lab Results  Component Value Date   HGBA1C 10.6 (A) 08/31/2019   HGBA1C 14.0 (A) 07/28/2019   HGBA1C 12.2 (A) 01/05/2019   Last seen for diabetes 2 months ago.  Management since then includes; Offered to change metformin to XR but patient would like to wait until next visit. A1c improved today to 10.6%. Keep taking medications and follow up in 2 months. Keep working on lifestyle changes and reducing sugar. [facility] exam [facility] at [facility] on 09/28/2019.  She reports fair compliance with treatment. She is not having side effects. none Current symptoms include none and have been unchanged. Home blood sugar records: fasting range: 200's  Episodes of hypoglycemia? no   Current Insulin Regimen: Most Recent Eye Exam:  09/28/2019 Weight trend: stable Prior visit with dietician: no Current diet: in general, an "unhealthy" diet Current exercise: none  ----------------------------------------------------------    Hypertension, follow-up:  BP Readings from Last 3 Encounters:  11/03/19 109/70  10/12/19 (!) 193/86  08/31/19 134/86    She was last seen for hypertension 2 months ago.  BP at that visit was 134/86. Management since that visit includes; Continue Lisinopril 10 mg daily.  .She reports good compliance with treatment. She is not having side effects. none She is not exercising. She is not adherent to low salt diet.   Outside blood pressures are not checking. She is experiencing none.  Patient denies chest pain, chest pressure/discomfort, claudication, dyspnea, exertional chest pressure/discomfort, fatigue, irregular heart beat, lower extremity  edema, near-syncope, orthopnea, palpitations, paroxysmal nocturnal dyspnea, syncope and tachypnea.   Cardiovascular risk factors include diabetes mellitus and dyslipidemia.  Use of agents associated with hypertension: none.   ----------------------------------------------------------    Lipid/Cholesterol, Follow-up:   Last seen for this 2 months ago.  Management since that visit includes; Continue Lipitor 80 mg daily. .  Last Lipid Panel:    Component Value Date/Time   CHOL 140 07/28/2019 0906   TRIG 495 (H) 07/28/2019 0906   HDL 26 (L) 07/28/2019 0906   CHOLHDL 5.4 (H) 07/28/2019 0906   LDLCALC 42 07/28/2019 0906   LDLDIRECT 37 07/28/2019 0906    She reports fair compliance with treatment. She having diarrhea. none Wt Readings from Last 3 Encounters:  11/03/19 165 lb (74.8 kg)  10/12/19 165 lb (74.8 kg)  08/31/19 165 lb 12.8 oz (75.2 kg)    ----------------------------------------------------------    No Known Allergies   Current Outpatient Medications:  .  BAYER CONTOUR NEXT TEST test strip, CHECK SUGAR D, Disp: , Rfl: 0 .  blood glucose meter kit and supplies KIT, Check sugar daily, Disp: 1 each, Rfl: 0 .  Cholecalciferol (VITAMIN D3) 2000 units TABS, Take by mouth., Disp: , Rfl:  .  glipiZIDE (GLUCOTROL) 10 MG tablet, TAKE 1 TABLET BY MOUTH TWICE DAILY BEFORE MEALS 30 MINUTES BEFORE EATING, Disp: 180 tablet, Rfl: 1 .  metFORMIN (GLUCOPHAGE) 1000 MG tablet, TAKE 1 TABLET(1000 MG) BY MOUTH TWICE DAILY WITH A MEAL, Disp: 180 tablet, Rfl: 0 .  MICROLET LANCETS MISC, CHECK SUGAR D, Disp: , Rfl: 0 .  Omega-3 Fatty Acids (  FISH OIL) 1000 MG CAPS, Take by mouth daily., Disp: , Rfl:  .  omeprazole (PRILOSEC) 20 MG capsule, TAKE 1 CAPSULE(20 MG) BY MOUTH DAILY, Disp: 30 capsule, Rfl: 2 .  sertraline (ZOLOFT) 100 MG tablet, Take 1 tablet (100 mg total) by mouth daily., Disp: 90 tablet, Rfl: 3 .  atorvastatin (LIPITOR) 80 MG tablet, Take 1 tablet (80 mg total) by mouth  daily., Disp: 90 tablet, Rfl: 3 .  HYDROcodone-acetaminophen (NORCO/VICODIN) 5-325 MG tablet, Take 1-2 tablets by mouth every 6 (six) hours as needed for moderate pain or severe pain. (Patient not taking: Reported on 11/03/2019), Disp: 15 tablet, Rfl: 0 .  ibuprofen (ADVIL,MOTRIN) 200 MG tablet, Take 200 mg by mouth every 6 (six) hours as needed., Disp: , Rfl:  .  lisinopril (PRINIVIL,ZESTRIL) 10 MG tablet, Take 1 tablet (10 mg total) by mouth daily., Disp: 90 tablet, Rfl: 3 .  nystatin ointment (MYCOSTATIN), APP EXT AA BID, Disp: , Rfl:  .  nystatin-triamcinolone ointment (MYCOLOG), Apply 1 application topically 2 (two) times daily. (Patient not taking: Reported on 11/03/2019), Disp: 30 g, Rfl: 1 .  ondansetron (ZOFRAN ODT) 4 MG disintegrating tablet, Take 1 tablet (4 mg total) by mouth every 8 (eight) hours as needed for nausea or vomiting. (Patient not taking: Reported on 11/03/2019), Disp: 20 tablet, Rfl: 0 .  triamcinolone ointment (KENALOG) 0.1 %, APPLY EXTERNALLY TO THE AFFECTED AREA BID, Disp: , Rfl:   Review of Systems  Constitutional: Negative for appetite change, chills, fatigue and fever.  Respiratory: Negative for chest tightness and shortness of breath.   Cardiovascular: Negative for chest pain and palpitations.  Gastrointestinal: Negative for abdominal pain, nausea and vomiting.  Neurological: Negative for dizziness and weakness.    Social History   Tobacco Use  . Smoking status: Current Every Day Smoker    Packs/day: 1.00    Types: Cigarettes  . Smokeless tobacco: Never Used  Substance Use Topics  . Alcohol use: No      Objective:   BP 109/70 (BP Location: Left Arm, Patient Position: Sitting, Cuff Size: Large)   Pulse 94   Temp (!) 96.9 F (36.1 C) (Other (Comment))   Resp 18   Ht '5\' 6"'$  (1.676 m)   Wt 165 lb (74.8 kg)   SpO2 97%   BMI 26.63 kg/m  Vitals:   11/03/19 0838  BP: 109/70  Pulse: 94  Resp: 18  Temp: (!) 96.9 F (36.1 C)  TempSrc: Other  (Comment)  SpO2: 97%  Weight: 165 lb (74.8 kg)  Height: '5\' 6"'$  (1.676 m)  Body mass index is 26.63 kg/m.   Physical Exam Constitutional:      Appearance: Normal appearance.  Cardiovascular:     Rate and Rhythm: Normal rate and regular rhythm.     Heart sounds: Normal heart sounds.  Pulmonary:     Effort: Pulmonary effort is normal.     Breath sounds: Normal breath sounds.  Skin:    General: Skin is warm and dry.  Neurological:     Mental Status: She is alert and oriented to person, place, and time. Mental status is at baseline.  Psychiatric:        Mood and Affect: Mood normal.        Behavior: Behavior normal.      No results found for any visits on 11/03/19.     Assessment & Plan    1. Type 2 diabetes mellitus with hyperglycemia, without long-term current use of insulin ([place])  Have  discussed different diabetes options. Patient is on maximum dose metformin and is having some diarrhea so we will change to XR. She is also on glipizide. Discussed I think GLP1 would be the best for her. She wants to take an oral medication and friend had good success with SGLT2. Will start as below.   Counseled that medication will increase urinary frequency, which tends to level off with continued use of the medication. Counseled on risk of UTI and yeast infection. Samples provided. Follow up 3 months diabetes.   - POCT HgB A1C - metFORMIN (GLUCOPHAGE XR) 500 MG 24 hr tablet; Take 2 tablets (1,000 mg total) by mouth 2 (two) times daily.  Dispense: 360 tablet; Refill: 0 - dapagliflozin propanediol (FARXIGA) 5 MG TABS tablet; Take 5 mg by mouth daily before breakfast.  Dispense: 90 tablet; Refill: 1  2. Hyperlipidemia associated with type 2 diabetes mellitus (HCC)  Continue lipitor 80 mg daily.   3. Hypertension associated with diabetes ([place])  Continue current medications.   The entirety of the information documented in the History of Present Illness, Review of Systems and Physical Exam  were personally obtained by me. Portions of this information were initially documented by April M. Sabra Heck, CMA and reviewed by me for thoroughness and accuracy.        Trinna Post, PA-C  [facility]

## 2019-11-03 ENCOUNTER — Encounter: Payer: Self-pay | Admitting: Physician Assistant

## 2019-11-03 ENCOUNTER — Ambulatory Visit (INDEPENDENT_AMBULATORY_CARE_PROVIDER_SITE_OTHER): Payer: BC Managed Care – PPO | Admitting: Physician Assistant

## 2019-11-03 ENCOUNTER — Other Ambulatory Visit: Payer: Self-pay

## 2019-11-03 ENCOUNTER — Encounter: Payer: Self-pay | Admitting: *Deleted

## 2019-11-03 VITALS — BP 109/70 | HR 94 | Temp 96.9°F | Resp 18 | Ht 66.0 in | Wt 165.0 lb

## 2019-11-03 DIAGNOSIS — I152 Hypertension secondary to endocrine disorders: Secondary | ICD-10-CM

## 2019-11-03 DIAGNOSIS — E1169 Type 2 diabetes mellitus with other specified complication: Secondary | ICD-10-CM

## 2019-11-03 DIAGNOSIS — E785 Hyperlipidemia, unspecified: Secondary | ICD-10-CM | POA: Diagnosis not present

## 2019-11-03 DIAGNOSIS — I10 Essential (primary) hypertension: Secondary | ICD-10-CM

## 2019-11-03 DIAGNOSIS — E1165 Type 2 diabetes mellitus with hyperglycemia: Secondary | ICD-10-CM

## 2019-11-03 DIAGNOSIS — E1159 Type 2 diabetes mellitus with other circulatory complications: Secondary | ICD-10-CM | POA: Diagnosis not present

## 2019-11-03 LAB — POCT GLYCOSYLATED HEMOGLOBIN (HGB A1C)
Est. average glucose Bld gHb Est-mCnc: 206
Hemoglobin A1C: 8.8 % — AB (ref 4.0–5.6)

## 2019-11-03 MED ORDER — FARXIGA 5 MG PO TABS: 5.0000 mg | ORAL_TABLET | Freq: Every day | ORAL | 1 refills | Status: DC

## 2019-11-03 MED ORDER — METFORMIN HCL ER 500 MG PO TB24: 1000.0000 mg | ORAL_TABLET | Freq: Two times a day (BID) | ORAL | 0 refills | Status: DC

## 2019-12-01 ENCOUNTER — Other Ambulatory Visit: Payer: Self-pay | Admitting: Physician Assistant

## 2019-12-01 DIAGNOSIS — K219 Gastro-esophageal reflux disease without esophagitis: Secondary | ICD-10-CM

## 2019-12-28 ENCOUNTER — Other Ambulatory Visit: Payer: Self-pay | Admitting: Physician Assistant

## 2019-12-28 DIAGNOSIS — F411 Generalized anxiety disorder: Secondary | ICD-10-CM

## 2019-12-28 MED ORDER — SERTRALINE HCL 100 MG PO TABS: 100.0000 mg | ORAL_TABLET | Freq: Every day | ORAL | 3 refills | Status: DC

## 2019-12-28 NOTE — Telephone Encounter (Signed)
[facility] faxed refill request for the following medications:  sertraline (ZOLOFT) 100 MG tablet   Please advise.  Thanks, TGH 

## 2019-12-31 LAB — LIPID PANEL
HDL: 36 (ref 35–70)
LDL Cholesterol: 67
LDl/HDL Ratio: 4.2

## 2019-12-31 LAB — BASIC METABOLIC PANEL: Glucose: 161

## 2020-01-28 ENCOUNTER — Ambulatory Visit: Payer: BC Managed Care – PPO | Admitting: Physician Assistant

## 2020-01-29 ENCOUNTER — Other Ambulatory Visit: Payer: Self-pay | Admitting: Physician Assistant

## 2020-01-29 DIAGNOSIS — E1165 Type 2 diabetes mellitus with hyperglycemia: Secondary | ICD-10-CM

## 2020-02-02 ENCOUNTER — Telehealth: Payer: Self-pay

## 2020-02-02 ENCOUNTER — Ambulatory Visit: Payer: BC Managed Care – PPO | Admitting: Physician Assistant

## 2020-02-02 NOTE — Telephone Encounter (Signed)
Call to patient to let her know that she is due for her 3 year follow up colonoscopy. She will be returning to Dr Bary Castilla for this exam.

## 2020-02-04 ENCOUNTER — Other Ambulatory Visit: Payer: Self-pay | Admitting: Physician Assistant

## 2020-02-04 ENCOUNTER — Ambulatory Visit: Payer: BC Managed Care – PPO | Admitting: Physician Assistant

## 2020-02-04 DIAGNOSIS — E1165 Type 2 diabetes mellitus with hyperglycemia: Secondary | ICD-10-CM

## 2020-02-08 ENCOUNTER — Other Ambulatory Visit: Payer: Self-pay

## 2020-02-08 ENCOUNTER — Encounter: Payer: Self-pay | Admitting: Physician Assistant

## 2020-02-08 ENCOUNTER — Ambulatory Visit (INDEPENDENT_AMBULATORY_CARE_PROVIDER_SITE_OTHER): Payer: BC Managed Care – PPO | Admitting: Physician Assistant

## 2020-02-08 VITALS — BP 114/74 | HR 97 | Temp 96.2°F | Wt 162.0 lb

## 2020-02-08 DIAGNOSIS — I10 Essential (primary) hypertension: Secondary | ICD-10-CM | POA: Diagnosis not present

## 2020-02-08 DIAGNOSIS — E1165 Type 2 diabetes mellitus with hyperglycemia: Secondary | ICD-10-CM | POA: Diagnosis not present

## 2020-02-08 DIAGNOSIS — E781 Pure hyperglyceridemia: Secondary | ICD-10-CM

## 2020-02-08 DIAGNOSIS — E785 Hyperlipidemia, unspecified: Secondary | ICD-10-CM

## 2020-02-08 DIAGNOSIS — C50919 Malignant neoplasm of unspecified site of unspecified female breast: Secondary | ICD-10-CM

## 2020-02-08 LAB — POCT GLYCOSYLATED HEMOGLOBIN (HGB A1C): Hemoglobin A1C: 7.7 % — AB (ref 4.0–5.6)

## 2020-02-08 MED ORDER — GLIPIZIDE 10 MG PO TABS: 10.0000 mg | ORAL_TABLET | Freq: Every day | ORAL | 1 refills | Status: DC

## 2020-02-08 MED ORDER — FARXIGA 10 MG PO TABS: 10.0000 mg | ORAL_TABLET | Freq: Every day | ORAL | 1 refills | Status: DC

## 2020-02-08 NOTE — Patient Instructions (Signed)
Diabetes Mellitus and Exercise Exercising regularly is important for your overall health, especially when you have diabetes (diabetes mellitus). Exercising is not only about losing weight. It has many other health benefits, such as increasing muscle strength and bone density and reducing body fat and stress. This leads to improved fitness, flexibility, and endurance, all of which result in better overall health. Exercise has additional benefits for people with diabetes, including:  Reducing appetite.  Helping to lower and control blood glucose.  Lowering blood pressure.  Helping to control amounts of fatty substances (lipids) in the blood, such as cholesterol and triglycerides.  Helping the body to respond better to insulin (improving insulin sensitivity).  Reducing how much insulin the body needs.  Decreasing the risk for heart disease by: ? Lowering cholesterol and triglyceride levels. ? Increasing the levels of good cholesterol. ? Lowering blood glucose levels. What is my activity plan? Your health care provider or certified diabetes educator can help you make a plan for the type and frequency of exercise (activity plan) that works for you. Make sure that you:  Do at least 150 minutes of moderate-intensity or vigorous-intensity exercise each week. This could be brisk walking, biking, or water aerobics. ? Do stretching and strength exercises, such as yoga or weightlifting, at least 2 times a week. ? Spread out your activity over at least 3 days of the week.  Get some form of physical activity every day. ? Do not go more than 2 days in a row without some kind of physical activity. ? Avoid being inactive for more than 30 minutes at a time. Take frequent breaks to walk or stretch.  Choose a type of exercise or activity that you enjoy, and set realistic goals.  Start slowly, and gradually increase the intensity of your exercise over time. What do I need to know about managing my  diabetes?   Check your blood glucose before and after exercising. ? If your blood glucose is 240 mg/dL (13.3 mmol/L) or higher before you exercise, check your urine for ketones. If you have ketones in your urine, do not exercise until your blood glucose returns to normal. ? If your blood glucose is 100 mg/dL (5.6 mmol/L) or lower, eat a snack containing 15-20 grams of carbohydrate. Check your blood glucose 15 minutes after the snack to make sure that your level is above 100 mg/dL (5.6 mmol/L) before you start your exercise.  Know the symptoms of low blood glucose (hypoglycemia) and how to treat it. Your risk for hypoglycemia increases during and after exercise. Common symptoms of hypoglycemia can include: ? Hunger. ? Anxiety. ? Sweating and feeling clammy. ? Confusion. ? Dizziness or feeling light-headed. ? Increased heart rate or palpitations. ? Blurry vision. ? Tingling or numbness around the mouth, lips, or tongue. ? Tremors or shakes. ? Irritability.  Keep a rapid-acting carbohydrate snack available before, during, and after exercise to help prevent or treat hypoglycemia.  Avoid injecting insulin into areas of the body that are going to be exercised. For example, avoid injecting insulin into: ? The arms, when playing tennis. ? The legs, when jogging.  Keep records of your exercise habits. Doing this can help you and your health care provider adjust your diabetes management plan as needed. Write down: ? Food that you eat before and after you exercise. ? Blood glucose levels before and after you exercise. ? The type and amount of exercise you have done. ? When your insulin is expected to peak, if you use   insulin. Avoid exercising at times when your insulin is peaking.  When you start a new exercise or activity, work with your health care provider to make sure the activity is safe for you, and to adjust your insulin, medicines, or food intake as needed.  Drink plenty of water while  you exercise to prevent dehydration or heat stroke. Drink enough fluid to keep your urine clear or pale yellow. Summary  Exercising regularly is important for your overall health, especially when you have diabetes (diabetes mellitus).  Exercising has many health benefits, such as increasing muscle strength and bone density and reducing body fat and stress.  Your health care provider or certified diabetes educator can help you make a plan for the type and frequency of exercise (activity plan) that works for you.  When you start a new exercise or activity, work with your health care provider to make sure the activity is safe for you, and to adjust your insulin, medicines, or food intake as needed. This information is not intended to replace advice given to you by your health care provider. Make sure you discuss any questions you have with your health care provider. Document Revised: 05/22/2017 Document Reviewed: 04/08/2016 Elsevier Patient Education  2020 Elsevier Inc.  

## 2020-02-08 NOTE — Progress Notes (Signed)
Patient: Cassandra Butler Female    DOB: Dec 18, 1965   54 y.o.   MRN: [id number] Visit Date: 02/08/2020  Today's Provider: Trinna Post, PA-C   Chief Complaint  Patient presents with  . Hyperlipidemia  . Hypertension  . Diabetes   Subjective:     HPI  Diabetes Mellitus Type II, Follow-up:   Lab Results  Component Value Date   HGBA1C 8.8 (A) 11/03/2019   HGBA1C 10.6 (A) 08/31/2019   HGBA1C 14.0 (A) 07/28/2019   Last seen for diabetes 3 months ago.  Management since then includes adding farxiga 5 mg QD in addition to metformin XR 1000 mg BID and glipizide 10 mg with dinner. She reports excellent compliance with treatment. She is not having side effects.  Current symptoms include none and have been stable. Home blood sugar records: Mid to high 100's  Episodes of hypoglycemia? no   Current Insulin Regimen: None Most Recent Eye Exam: 09/28/2019 Weight trend: stable Current diet: in general, a "healthy" diet   Current exercise: walking  ------------------------------------------------------------------------    Hypertension, follow-up:  BP Readings from Last 3 Encounters:  02/08/20 114/74  11/03/19 109/70  10/12/19 (!) 193/86    She was last seen for hypertension 3 months ago.  BP at that visit was 109/70. Management since that visit includes none.She reports excellent compliance with treatment. She is not having side effects.  She is exercising. She is adherent to low salt diet.   Outside blood pressures are not being checked. She is experiencing none.  Patient denies chest pain, dyspnea, fatigue and palpitations.   Cardiovascular risk factors include diabetes mellitus, dyslipidemia and hypertension.  Use of agents associated with hypertension: none.   ------------------------------------------------------------------------    Lipid/Cholesterol, Follow-up:   Last seen for this 3 months ago.  Management since that visit includes no  chagnes.  Last Lipid Panel:    Component Value Date/Time   CHOL 140 07/28/2019 0906   TRIG 495 (H) 07/28/2019 0906   HDL 26 (L) 07/28/2019 0906   CHOLHDL 5.4 (H) 07/28/2019 0906   LDLCALC 42 07/28/2019 0906   LDLDIRECT 37 07/28/2019 0906    She reports excellent compliance with treatment. She is not having side effects.   Wt Readings from Last 3 Encounters:  02/08/20 162 lb (73.5 kg)  11/03/19 165 lb (74.8 kg)  10/12/19 165 lb (74.8 kg)    ------------------------------------------------------------------------    No Known Allergies   Current Outpatient Medications:  .  atorvastatin (LIPITOR) 80 MG tablet, Take 1 tablet (80 mg total) by mouth daily., Disp: 90 tablet, Rfl: 3 .  BAYER CONTOUR NEXT TEST test strip, CHECK SUGAR D, Disp: , Rfl: 0 .  blood glucose meter kit and supplies KIT, Check sugar daily, Disp: 1 each, Rfl: 0 .  Cholecalciferol (VITAMIN D3) 2000 units TABS, Take by mouth., Disp: , Rfl:  .  dapagliflozin propanediol (FARXIGA) 5 MG TABS tablet, Take 5 mg by mouth daily before breakfast., Disp: 90 tablet, Rfl: 1 .  glipiZIDE (GLUCOTROL) 10 MG tablet, Take 1 tablet (10 mg total) by mouth daily before breakfast. TAKE 1 TABLET BY MOUTH TWICE DAILY BEFORE MEALS 30 MINUTES BEFORE EATING, Disp: 90 tablet, Rfl: 1 .  ibuprofen (ADVIL,MOTRIN) 200 MG tablet, Take 200 mg by mouth every 6 (six) hours as needed., Disp: , Rfl:  .  lisinopril (PRINIVIL,ZESTRIL) 10 MG tablet, Take 1 tablet (10 mg total) by mouth daily., Disp: 90 tablet, Rfl: 3 .  metFORMIN (GLUCOPHAGE-XR)  500 MG 24 hr tablet, TAKE 2 TABLETS(1000 MG) BY MOUTH TWICE DAILY, Disp: 360 tablet, Rfl: 0 .  MICROLET LANCETS MISC, CHECK SUGAR D, Disp: , Rfl: 0 .  nystatin ointment (MYCOSTATIN), APP EXT AA BID, Disp: , Rfl:  .  Omega-3 Fatty Acids (FISH OIL) 1000 MG CAPS, Take by mouth daily., Disp: , Rfl:  .  omeprazole (PRILOSEC) 20 MG capsule, TAKE 1 CAPSULE(20 MG) BY MOUTH DAILY, Disp: 30 capsule, Rfl: 2 .   sertraline (ZOLOFT) 100 MG tablet, Take 1 tablet (100 mg total) by mouth daily., Disp: 90 tablet, Rfl: 3 .  triamcinolone ointment (KENALOG) 0.1 %, APPLY EXTERNALLY TO THE AFFECTED AREA BID, Disp: , Rfl:   Review of Systems  Constitutional: Negative.   Respiratory: Negative.   Cardiovascular: Negative.   Gastrointestinal: Negative.   Genitourinary: Negative.   Neurological: Negative.   Hematological: Negative.     Social History   Tobacco Use  . Smoking status: Current Every Day Smoker    Packs/day: 1.00    Types: Cigarettes  . Smokeless tobacco: Never Used  Substance Use Topics  . Alcohol use: No      Objective:   BP 114/74 (BP Location: Left Arm, Patient Position: Sitting, Cuff Size: Large)   Pulse 97   Temp (!) 96.2 F (35.7 C) (Temporal)   Wt 162 lb (73.5 kg)   BMI 26.15 kg/m  Vitals:   02/08/20 0922  BP: 114/74  Pulse: 97  Temp: (!) 96.2 F (35.7 C)  TempSrc: Temporal  Weight: 162 lb (73.5 kg)  Body mass index is 26.15 kg/m.   Physical Exam Constitutional:      Appearance: Normal appearance.  Cardiovascular:     Rate and Rhythm: Normal rate and regular rhythm.     Heart sounds: Normal heart sounds.  Pulmonary:     Effort: Pulmonary effort is normal.     Breath sounds: Normal breath sounds.  Skin:    General: Skin is warm and dry.  Neurological:     Mental Status: She is alert and oriented to person, place, and time. Mental status is at baseline.  Psychiatric:        Mood and Affect: Mood normal.        Behavior: Behavior normal.      No results found for any visits on 02/08/20.     Assessment & Plan    1. Type 2 diabetes mellitus with hyperglycemia, without long-term current use of insulin (HCC)  A1c improved to 7.7% down from 8.8% last visit. Will increase farxiga to 10 mg daily and see her back in three months.   - POCT glycosylated hemoglobin (Hb A1C) - glipiZIDE (GLUCOTROL) 10 MG tablet; Take 1 tablet (10 mg total) by mouth daily  before breakfast. TAKE 1 TABLET BY MOUTH TWICE DAILY BEFORE MEALS 30 MINUTES BEFORE EATING  Dispense: 90 tablet; Refill: 1 - dapagliflozin propanediol (FARXIGA) 10 MG TABS tablet; Take 10 mg by mouth daily before breakfast.  Dispense: 90 tablet; Refill: 1  2. Essential (primary) hypertension  Continue Lisinopril.   3. Hyperlipidemia, unspecified hyperlipidemia type  Continue atorvastatin 80 mg QHS. May take fish oil 3g QD.   4. Hypertriglyceridemia   5. Malignant neoplasm of female breast, unspecified estrogen receptor status, unspecified laterality, unspecified site of breast [facility])  The entirety of the information documented in the History of Present Illness, Review of Systems and Physical Exam were personally obtained by me. Portions of this information were initially documented by [facility]  and reviewed by me for thoroughness and accuracy.   F/u 3 months chronic       Trinna Post, PA-C  [facility]

## 2020-02-28 ENCOUNTER — Other Ambulatory Visit: Payer: Self-pay | Admitting: Physician Assistant

## 2020-02-28 DIAGNOSIS — I10 Essential (primary) hypertension: Secondary | ICD-10-CM

## 2020-02-28 MED ORDER — LISINOPRIL 10 MG PO TABS: 10.0000 mg | ORAL_TABLET | Freq: Every day | ORAL | 3 refills | Status: DC

## 2020-02-28 NOTE — Telephone Encounter (Signed)
I would like this medication sent in for six months, #90 with 1 refill. Not a year. Please call pharmacy and change Rx.

## 2020-02-28 NOTE — Telephone Encounter (Signed)
[facility] faxed refill request for the following medications:  lisinopril (PRINIVIL,ZESTRIL) 10 MG tablet  Please advise.  Thanks, [employer]

## 2020-02-29 ENCOUNTER — Other Ambulatory Visit: Payer: Self-pay | Admitting: Physician Assistant

## 2020-02-29 DIAGNOSIS — K219 Gastro-esophageal reflux disease without esophagitis: Secondary | ICD-10-CM

## 2020-02-29 MED ORDER — LISINOPRIL 10 MG PO TABS: 10.0000 mg | ORAL_TABLET | Freq: Every day | ORAL | 1 refills | Status: DC

## 2020-02-29 NOTE — Telephone Encounter (Signed)
Requested Prescriptions  Pending Prescriptions Disp Refills  . omeprazole (PRILOSEC) 20 MG capsule [Pharmacy Med Name: OMEPRAZOLE 20MG  CAPSULES] 30 capsule 2    Sig: TAKE 1 CAPSULE(20 MG) BY MOUTH DAILY     Gastroenterology: Proton Pump Inhibitors Passed - 02/29/2020  3:40 AM      Passed - Valid encounter within last 12 months    Recent Outpatient Visits          3 weeks ago Type 2 diabetes mellitus with hyperglycemia, without long-term current use of insulin [facility])   [place], Adriana M, PA-C   3 months ago Type 2 diabetes mellitus with hyperglycemia, without long-term current use of insulin [facility])   [place], [place], PA-C   6 months ago Type 2 diabetes mellitus with hyperglycemia, without long-term current use of insulin [facility])   [place], [place], PA-C   7 months ago Type 2 diabetes mellitus with hyperglycemia, without long-term current use of insulin [facility])   [facility] [place], [place], [place]   11 months ago Rhus dermatitis   [place], [place]      Future Appointments            In 2 months [place], Wendee Beavers, PA-C [employer], [facility]

## 2020-02-29 NOTE — Addendum Note (Signed)
Addended by: Judie Petit on: 02/29/2020 04:43 PM   Modules accepted: Orders

## 2020-02-29 NOTE — Telephone Encounter (Signed)
The pharmacy has been contacted and the prescription has been resubmitted for lisinopril 10 mg 90qty with 1 rf

## 2020-05-10 ENCOUNTER — Ambulatory Visit: Payer: BC Managed Care – PPO | Admitting: Physician Assistant

## 2020-05-13 ENCOUNTER — Other Ambulatory Visit: Payer: Self-pay | Admitting: Physician Assistant

## 2020-05-13 DIAGNOSIS — E1165 Type 2 diabetes mellitus with hyperglycemia: Secondary | ICD-10-CM

## 2020-05-14 ENCOUNTER — Other Ambulatory Visit: Payer: Self-pay | Admitting: Physician Assistant

## 2020-05-14 DIAGNOSIS — E1165 Type 2 diabetes mellitus with hyperglycemia: Secondary | ICD-10-CM

## 2020-05-14 NOTE — Telephone Encounter (Signed)
Requested Prescriptions  Pending Prescriptions Disp Refills  . glipiZIDE (GLUCOTROL) 10 MG tablet [Pharmacy Med Name: GLIPIZIDE 10MG  TABLETS] 180 tablet 0    Sig: TAKE 1 TABLET BY MOUTH TWICE DAILY BEFORE MEALS 30 MINUTES BEFORE EATING     Endocrinology:  Diabetes - Sulfonylureas Passed - 05/14/2020  3:38 AM      Passed - HBA1C is between 0 and 7.9 and within 180 days    Hemoglobin A1C  Date Value Ref Range Status  02/08/2020 7.7 (A) 4.0 - 5.6 % Final  03/24/2017 9.0  Final         Passed - Valid encounter within last 6 months    Recent Outpatient Visits          3 months ago Type 2 diabetes mellitus with hyperglycemia, without long-term current use of insulin [facility])   [place], [place], PA-C   6 months ago Type 2 diabetes mellitus with hyperglycemia, without long-term current use of insulin [facility])   [place], [place], PA-C   8 months ago Type 2 diabetes mellitus with hyperglycemia, without long-term current use of insulin [facility])   [place], [place], PA-C   9 months ago Type 2 diabetes mellitus with hyperglycemia, without long-term current use of insulin [facility])   [facility] [place], Wendee Beavers, [place]   1 year ago Rhus dermatitis   [employer], [place], [place]

## 2020-05-17 ENCOUNTER — Telehealth: Payer: Self-pay | Admitting: Physician Assistant

## 2020-05-17 NOTE — Telephone Encounter (Signed)
No show warning letter sent.  

## 2020-07-19 ENCOUNTER — Encounter: Payer: Self-pay | Admitting: Physician Assistant

## 2020-07-19 ENCOUNTER — Other Ambulatory Visit: Payer: Self-pay

## 2020-07-19 ENCOUNTER — Ambulatory Visit (INDEPENDENT_AMBULATORY_CARE_PROVIDER_SITE_OTHER): Payer: BC Managed Care – PPO | Admitting: Physician Assistant

## 2020-07-19 VITALS — BP 148/92 | HR 82 | Temp 98.7°F | Wt 163.0 lb

## 2020-07-19 DIAGNOSIS — R21 Rash and other nonspecific skin eruption: Secondary | ICD-10-CM

## 2020-07-19 DIAGNOSIS — Z1211 Encounter for screening for malignant neoplasm of colon: Secondary | ICD-10-CM

## 2020-07-19 DIAGNOSIS — E1165 Type 2 diabetes mellitus with hyperglycemia: Secondary | ICD-10-CM

## 2020-07-19 DIAGNOSIS — I10 Essential (primary) hypertension: Secondary | ICD-10-CM | POA: Diagnosis not present

## 2020-07-19 DIAGNOSIS — E785 Hyperlipidemia, unspecified: Secondary | ICD-10-CM

## 2020-07-19 LAB — POCT GLYCOSYLATED HEMOGLOBIN (HGB A1C)
Est. average glucose Bld gHb Est-mCnc: 180
Hemoglobin A1C: 7.9 % — AB (ref 4.0–5.6)

## 2020-07-19 MED ORDER — TRIAMCINOLONE ACETONIDE 0.1 % EX CREA: 1.0000 "application " | TOPICAL_CREAM | Freq: Two times a day (BID) | CUTANEOUS | 0 refills | Status: DC

## 2020-07-19 MED ORDER — RYBELSUS 3 MG PO TABS: ORAL_TABLET | ORAL | 0 refills | Status: DC

## 2020-07-19 NOTE — Progress Notes (Signed)
Established patient visit   Patient: Cassandra Butler   DOB: 16-Aug-1966   54 y.o. Female  MRN: [id number] Visit Date: 07/19/2020  Today's healthcare provider: Trinna Post, PA-C   Chief Complaint  Patient presents with  . Diabetes  . Rash  I,[place] Kretz M [place] Teters,acting as a scribe for [employer], PA-C.,have documented all relevant documentation on the behalf of Trinna Post, PA-C,as directed by  Trinna Post, PA-C while in the presence of Trinna Post, PA-C.  Subjective    Rash This is a new problem. The current episode started 1 to 4 weeks ago. The problem is unchanged. The affected locations include the left hand, left lower leg and left foot. The rash is characterized by dryness, itchiness, blistering, redness and pain. Pertinent negatives include no congestion, cough, fatigue, fever, joint pain, shortness of breath or sore throat. Past treatments include moisturizer. The treatment provided mild relief.    Diabetes Mellitus Type II, Follow-up  Lab Results  Component Value Date   HGBA1C 7.9 (A) 07/19/2020   HGBA1C 7.7 (A) 02/08/2020   HGBA1C 8.8 (A) 11/03/2019   Wt Readings from Last 3 Encounters:  07/19/20 163 lb (73.9 kg)  02/08/20 162 lb (73.5 kg)  11/03/19 165 lb (74.8 kg)   Last seen for diabetes 5 months ago.  Management since then includes increased farxiga to 10 mg daily. But patient repoorts she stopped taking it due to side effects. She says it made her forget words.  She reports poor compliance with treatment. She is having side effects.  Symptoms: No fatigue No foot ulcerations  No appetite changes No nausea  No paresthesia of the feet  No polydipsia  No polyuria No visual disturbances   No vomiting     Home blood sugar records: fasting range: 150's-180's  Episodes of hypoglycemia? No    Current insulin regiment: None Most Recent Eye Exam:  Current exercise: no regular exercise Current diet habits: well balanced  Pertinent  Labs: Lab Results  Component Value Date   CHOL 140 07/28/2019   HDL 36 12/31/2019   LDLCALC 67 12/31/2019   LDLDIRECT 37 07/28/2019   TRIG 495 (H) 07/28/2019   CHOLHDL 5.4 (H) 07/28/2019   Lab Results  Component Value Date   NA 139 10/12/2019   K 3.8 10/12/2019   CREATININE 0.65 10/12/2019   GFRNONAA >60 10/12/2019   GFRAA >60 10/12/2019   GLUCOSE 166 (H) 10/12/2019     Hypertension, follow-up  BP Readings from Last 3 Encounters:  07/19/20 (!) 148/92  02/08/20 114/74  11/03/19 109/70   Wt Readings from Last 3 Encounters:  07/19/20 163 lb (73.9 kg)  02/08/20 162 lb (73.5 kg)  11/03/19 165 lb (74.8 kg)     She was last seen for hypertension 6 months ago.  BP at that visit was normal. Management since that visit includes continue Lisinopril 10 mg QD.  She reports fair compliance with treatment. She is not having side effects.  She is following a Regular diet. She is not exercising. She does smoke.  Use of agents associated with hypertension: none.   Outside blood pressures are not chekced. Symptoms: No chest pain No chest pressure  No palpitations No syncope  No dyspnea No orthopnea  No paroxysmal nocturnal dyspnea No lower extremity edema   Pertinent labs: Lab Results  Component Value Date   CHOL 140 07/28/2019   HDL 36 12/31/2019   LDLCALC 67 12/31/2019   LDLDIRECT 37 07/28/2019  TRIG 495 (H) 07/28/2019   CHOLHDL 5.4 (H) 07/28/2019   Lab Results  Component Value Date   NA 139 10/12/2019   K 3.8 10/12/2019   CREATININE 0.65 10/12/2019   GFRNONAA >60 10/12/2019   GFRAA >60 10/12/2019   GLUCOSE 166 (H) 10/12/2019     The 10-year ASCVD risk score Mikey Bussing DC Jr., et al., 2013) is: 13.8%* (Cholesterol units were assumed)   --------------------------------------------------------------------------------------------------- Lipid/Cholesterol, Follow-up  Last lipid panel Other pertinent labs  Lab Results  Component Value Date   CHOL 140 07/28/2019    HDL 36 12/31/2019   LDLCALC 67 12/31/2019   LDLDIRECT 37 07/28/2019   TRIG 495 (H) 07/28/2019   CHOLHDL 5.4 (H) 07/28/2019   Lab Results  Component Value Date   ALT 25 10/12/2019   AST 19 10/12/2019   PLT 81 (L) 10/12/2019   TSH 1.510 07/28/2019     She was last seen for this 5 months ago.  Management since that visit includes continue lipitor 80 mg QHS.  She reports fair compliance with treatment. She is not having side effects.   Symptoms: No chest pain No chest pressure/discomfort  No dyspnea No lower extremity edema  No numbness or tingling of extremity No orthopnea  No palpitations No paroxysmal nocturnal dyspnea  No speech difficulty No syncope   Current diet: in general, an "unhealthy" diet Current exercise: none  The 10-year ASCVD risk score Mikey Bussing DC Jr., et al., 2013) is: 13.8%* (Cholesterol units were assumed)  --------------------------------------------------------------------------------------------------- Colon Cancer Screening: call Dr. Bary Castilla for colonoscopy  ---------------------------------------------------------------------------------------------------      Medications: Outpatient Medications Prior to Visit  Medication Sig  . atorvastatin (LIPITOR) 80 MG tablet Take 1 tablet (80 mg total) by mouth daily.  Marland Kitchen BAYER CONTOUR NEXT TEST test strip CHECK SUGAR D  . blood glucose meter kit and supplies KIT Check sugar daily  . Cholecalciferol (VITAMIN D3) 2000 units TABS Take by mouth.  Marland Kitchen glipiZIDE (GLUCOTROL) 10 MG tablet TAKE 1 TABLET BY MOUTH TWICE DAILY BEFORE MEALS 30 MINUTES BEFORE EATING  . ibuprofen (ADVIL,MOTRIN) 200 MG tablet Take 200 mg by mouth every 6 (six) hours as needed.  Marland Kitchen lisinopril (ZESTRIL) 10 MG tablet Take 1 tablet (10 mg total) by mouth daily.  . metFORMIN (GLUCOPHAGE-XR) 500 MG 24 hr tablet TAKE 2 TABLETS(1000 MG) BY MOUTH TWICE DAILY  . MICROLET LANCETS MISC CHECK SUGAR D  . nystatin ointment (MYCOSTATIN) APP EXT AA BID  .  Omega-3 Fatty Acids (FISH OIL) 1000 MG CAPS Take by mouth daily.  Marland Kitchen omeprazole (PRILOSEC) 20 MG capsule TAKE 1 CAPSULE(20 MG) BY MOUTH DAILY  . sertraline (ZOLOFT) 100 MG tablet Take 1 tablet (100 mg total) by mouth daily.  . dapagliflozin propanediol (FARXIGA) 10 MG TABS tablet Take 10 mg by mouth daily before breakfast. (Patient not taking: Reported on 07/19/2020)  . [DISCONTINUED] triamcinolone ointment (KENALOG) 0.1 % APPLY EXTERNALLY TO THE AFFECTED AREA BID (Patient not taking: Reported on 07/19/2020)   No facility-administered medications prior to visit.    Review of Systems  Constitutional: Negative for fatigue and fever.  HENT: Negative for congestion and sore throat.   Respiratory: Negative for cough and shortness of breath.   Musculoskeletal: Negative for joint pain.  Skin: Positive for rash.      Objective    BP (!) 148/92 (BP Location: Left Arm, Patient Position: Sitting, Cuff Size: Normal)   Pulse 82   Temp 98.7 F (37.1 C) (Oral)   Wt 163 lb (73.9  kg)   SpO2 98%   BMI 26.31 kg/m    Physical Exam Constitutional:      Appearance: Normal appearance.  Cardiovascular:     Rate and Rhythm: Normal rate and regular rhythm.     Heart sounds: Normal heart sounds.  Pulmonary:     Effort: Pulmonary effort is normal.     Breath sounds: Normal breath sounds.  Skin:    General: Skin is warm and dry.  Neurological:     Mental Status: She is alert and oriented to person, place, and time. Mental status is at baseline.  Psychiatric:        Mood and Affect: Mood normal.        Behavior: Behavior normal.       Results for orders placed or performed in visit on 07/19/20  POCT glycosylated hemoglobin (Hb A1C)  Result Value Ref Range   Hemoglobin A1C 7.9 (A) 4.0 - 5.6 %   HbA1c POC (<> result, manual entry)     HbA1c, POC (prediabetic range)     HbA1c, POC (controlled diabetic range)     Est. average glucose Bld gHb Est-mCnc 180     Assessment & Plan    1. Type 2  diabetes mellitus with hyperglycemia, without long-term current use of insulin ([place])  She missed her last appoinment, did not call to cancel. Discussed that this is her second no show and if she has a third she may be let go from the clinic. She stopped farxiga three weeks ago. Will try rybelsus as below. Follow up 3 months.  - POCT glycosylated hemoglobin (Hb A1C) - Semaglutide (RYBELSUS) 3 MG TABS; Take 3 mg daily.  Dispense: 90 tablet; Refill: 0  2. Hyperlipidemia, unspecified hyperlipidemia type  Continue statin.  3. Essential (primary) hypertension  Continue Lisinopril  4. Rash  - triamcinolone cream (KENALOG) 0.1 %; Apply 1 application topically 2 (two) times daily.  Dispense: 30 g; Refill: 0  5. Colon cancer screening  Normal colonoscopy 2011. Would like to do cologuard.     Return in about 3 months (around 10/18/2020) for DM, HTN .      I, Trinna Post, PA-C, have reviewed all documentation for this visit. The documentation on 07/25/20 for the exam, diagnosis, procedures, and orders are all accurate and complete.  The entirety of the information documented in the History of Present Illness, Review of Systems and Physical Exam were personally obtained by me. Portions of this information were initially documented by [facility] and reviewed by me for thoroughness and accuracy.     Paulene Floor  [facility] [phone] (phone) [phone] (fax)  [place]

## 2020-07-25 ENCOUNTER — Encounter: Payer: Self-pay | Admitting: Physician Assistant

## 2020-07-26 DIAGNOSIS — M9902 Segmental and somatic dysfunction of thoracic region: Secondary | ICD-10-CM | POA: Diagnosis not present

## 2020-07-26 DIAGNOSIS — M9901 Segmental and somatic dysfunction of cervical region: Secondary | ICD-10-CM | POA: Diagnosis not present

## 2020-07-26 DIAGNOSIS — M5412 Radiculopathy, cervical region: Secondary | ICD-10-CM | POA: Diagnosis not present

## 2020-07-26 DIAGNOSIS — M62830 Muscle spasm of back: Secondary | ICD-10-CM | POA: Diagnosis not present

## 2020-07-28 DIAGNOSIS — M9901 Segmental and somatic dysfunction of cervical region: Secondary | ICD-10-CM | POA: Diagnosis not present

## 2020-07-28 DIAGNOSIS — M9902 Segmental and somatic dysfunction of thoracic region: Secondary | ICD-10-CM | POA: Diagnosis not present

## 2020-07-28 DIAGNOSIS — M5412 Radiculopathy, cervical region: Secondary | ICD-10-CM | POA: Diagnosis not present

## 2020-07-28 DIAGNOSIS — M62830 Muscle spasm of back: Secondary | ICD-10-CM | POA: Diagnosis not present

## 2020-08-01 DIAGNOSIS — M9901 Segmental and somatic dysfunction of cervical region: Secondary | ICD-10-CM | POA: Diagnosis not present

## 2020-08-01 DIAGNOSIS — M62830 Muscle spasm of back: Secondary | ICD-10-CM | POA: Diagnosis not present

## 2020-08-01 DIAGNOSIS — M9902 Segmental and somatic dysfunction of thoracic region: Secondary | ICD-10-CM | POA: Diagnosis not present

## 2020-08-01 DIAGNOSIS — M5412 Radiculopathy, cervical region: Secondary | ICD-10-CM | POA: Diagnosis not present

## 2020-08-02 DIAGNOSIS — M62830 Muscle spasm of back: Secondary | ICD-10-CM | POA: Diagnosis not present

## 2020-08-02 DIAGNOSIS — M9902 Segmental and somatic dysfunction of thoracic region: Secondary | ICD-10-CM | POA: Diagnosis not present

## 2020-08-02 DIAGNOSIS — M5412 Radiculopathy, cervical region: Secondary | ICD-10-CM | POA: Diagnosis not present

## 2020-08-02 DIAGNOSIS — M9901 Segmental and somatic dysfunction of cervical region: Secondary | ICD-10-CM | POA: Diagnosis not present

## 2020-08-07 DIAGNOSIS — M62830 Muscle spasm of back: Secondary | ICD-10-CM | POA: Diagnosis not present

## 2020-08-07 DIAGNOSIS — M5412 Radiculopathy, cervical region: Secondary | ICD-10-CM | POA: Diagnosis not present

## 2020-08-07 DIAGNOSIS — M9901 Segmental and somatic dysfunction of cervical region: Secondary | ICD-10-CM | POA: Diagnosis not present

## 2020-08-07 DIAGNOSIS — M9902 Segmental and somatic dysfunction of thoracic region: Secondary | ICD-10-CM | POA: Diagnosis not present

## 2020-08-14 DIAGNOSIS — M62830 Muscle spasm of back: Secondary | ICD-10-CM | POA: Diagnosis not present

## 2020-08-14 DIAGNOSIS — M9902 Segmental and somatic dysfunction of thoracic region: Secondary | ICD-10-CM | POA: Diagnosis not present

## 2020-08-14 DIAGNOSIS — M5412 Radiculopathy, cervical region: Secondary | ICD-10-CM | POA: Diagnosis not present

## 2020-08-14 DIAGNOSIS — M9901 Segmental and somatic dysfunction of cervical region: Secondary | ICD-10-CM | POA: Diagnosis not present

## 2020-08-16 ENCOUNTER — Other Ambulatory Visit: Payer: Self-pay

## 2020-08-16 ENCOUNTER — Encounter: Payer: Self-pay | Admitting: Physician Assistant

## 2020-08-16 ENCOUNTER — Ambulatory Visit: Payer: BC Managed Care – PPO | Admitting: Physician Assistant

## 2020-08-16 VITALS — BP 121/83 | HR 102 | Temp 98.4°F | Resp 16 | Wt 158.0 lb

## 2020-08-16 DIAGNOSIS — B379 Candidiasis, unspecified: Secondary | ICD-10-CM

## 2020-08-16 DIAGNOSIS — R300 Dysuria: Secondary | ICD-10-CM

## 2020-08-16 DIAGNOSIS — M62830 Muscle spasm of back: Secondary | ICD-10-CM | POA: Diagnosis not present

## 2020-08-16 DIAGNOSIS — T3695XA Adverse effect of unspecified systemic antibiotic, initial encounter: Secondary | ICD-10-CM

## 2020-08-16 DIAGNOSIS — M9901 Segmental and somatic dysfunction of cervical region: Secondary | ICD-10-CM | POA: Diagnosis not present

## 2020-08-16 DIAGNOSIS — M5412 Radiculopathy, cervical region: Secondary | ICD-10-CM | POA: Diagnosis not present

## 2020-08-16 DIAGNOSIS — N3001 Acute cystitis with hematuria: Secondary | ICD-10-CM | POA: Diagnosis not present

## 2020-08-16 DIAGNOSIS — R319 Hematuria, unspecified: Secondary | ICD-10-CM

## 2020-08-16 DIAGNOSIS — M9902 Segmental and somatic dysfunction of thoracic region: Secondary | ICD-10-CM | POA: Diagnosis not present

## 2020-08-16 LAB — POCT URINALYSIS DIPSTICK
Bilirubin, UA: NEGATIVE
Glucose, UA: NEGATIVE
Ketones, UA: NEGATIVE
Nitrite, UA: NEGATIVE
Protein, UA: NEGATIVE
Spec Grav, UA: 1.01 (ref 1.010–1.025)
Urobilinogen, UA: 0.2 E.U./dL
pH, UA: 7.5 (ref 5.0–8.0)

## 2020-08-16 MED ORDER — FLUCONAZOLE 150 MG PO TABS: 150.0000 mg | ORAL_TABLET | Freq: Once | ORAL | 0 refills | Status: AC

## 2020-08-16 MED ORDER — SULFAMETHOXAZOLE-TRIMETHOPRIM 800-160 MG PO TABS: 1.0000 | ORAL_TABLET | Freq: Two times a day (BID) | ORAL | 0 refills | Status: DC

## 2020-08-16 NOTE — Progress Notes (Signed)
Established patient visit   Patient: Cassandra Butler   DOB: [birth year]   54 y.o. Female  MRN: [id number] Visit Date: 08/16/2020  Today's healthcare provider: Mar Daring, PA-C   Chief Complaint  Patient presents with   Dysuria   Subjective    Dysuria  This is a new problem. The current episode started in the past 7 days (3 days ago). The problem occurs every urination. The problem has been unchanged. The quality of the pain is described as aching (pressure). The patient is experiencing no pain. There has been no fever. Associated symptoms include frequency, hematuria and urgency. Treatments tried: AZO. The treatment provided no relief. Her past medical history is significant for a single kidney. There is no history of kidney stones or recurrent UTIs.     Patient Active Problem List   Diagnosis Date Noted   Osteopenia of multiple sites 10/08/2016   Aromatase inhibitor use 10/08/2016   Type 2 diabetes mellitus with hyperglycemia ([place]) 02/01/2016   Breast CA ([place]) 04/04/2015   HLD (hyperlipidemia) 04/04/2015   Hypertriglyceridemia 04/04/2015   Obstructive sleep apnea of adult 04/04/2015   Malignant neoplasm of breast ([place]) 05/18/2012   Avitaminosis D 02/14/2010   Essential (primary) hypertension 08/07/2009   Family history of cardiovascular disease 02/09/2008   Current tobacco use 01/27/2008   Anxiety disorder 12/18/2004   Acid reflux 12/18/2004   Past Medical History:  Diagnosis Date   Cancer ([place]) 11/14/2007   Left breast wide excision, sentinel node biopsy. 1.4 cm, T1c, N0: ER 90%; PR 905; Her 2 neu not over expressed.    Diabetes mellitus without complication (HCC)    GERD (gastroesophageal reflux disease)    Hyperlipidemia    Hypertension    Vitamin D deficiency        Medications: Outpatient Medications Prior to Visit  Medication Sig   atorvastatin (LIPITOR) 80 MG tablet Take 1 tablet (80 mg total) by mouth daily.   BAYER  CONTOUR NEXT TEST test strip CHECK SUGAR D   blood glucose meter kit and supplies KIT Check sugar daily   Cholecalciferol (VITAMIN D3) 2000 units TABS Take by mouth.   ibuprofen (ADVIL,MOTRIN) 200 MG tablet Take 200 mg by mouth every 6 (six) hours as needed.   lisinopril (ZESTRIL) 10 MG tablet Take 1 tablet (10 mg total) by mouth daily.   metFORMIN (GLUCOPHAGE-XR) 500 MG 24 hr tablet TAKE 2 TABLETS(1000 MG) BY MOUTH TWICE DAILY   MICROLET LANCETS MISC CHECK SUGAR D   nystatin ointment (MYCOSTATIN) APP EXT AA BID   Omega-3 Fatty Acids (FISH OIL) 1000 MG CAPS Take by mouth daily.   omeprazole (PRILOSEC) 20 MG capsule TAKE 1 CAPSULE(20 MG) BY MOUTH DAILY   Semaglutide (RYBELSUS) 3 MG TABS Take 3 mg daily.   sertraline (ZOLOFT) 100 MG tablet Take 1 tablet (100 mg total) by mouth daily.   triamcinolone cream (KENALOG) 0.1 % Apply 1 application topically 2 (two) times daily.   [DISCONTINUED] glipiZIDE (GLUCOTROL) 10 MG tablet TAKE 1 TABLET BY MOUTH TWICE DAILY BEFORE MEALS 30 MINUTES BEFORE EATING   [DISCONTINUED] dapagliflozin propanediol (FARXIGA) 10 MG TABS tablet Take 10 mg by mouth daily before breakfast. (Patient not taking: Reported on 07/19/2020)   No facility-administered medications prior to visit.    Review of Systems  Constitutional: Negative.   Respiratory: Negative.   Cardiovascular: Negative.   Genitourinary: Positive for dysuria, frequency, hematuria and urgency.    Last CBC Lab Results  Component Value Date  WBC 5.4 10/12/2019   HGB 12.4 10/12/2019   HCT 37.2 10/12/2019   MCV 83.0 10/12/2019   MCH 27.7 10/12/2019   RDW 13.6 10/12/2019   PLT 81 (L) 15/40/0867   Last metabolic panel Lab Results  Component Value Date   GLUCOSE 166 (H) 10/12/2019   NA 139 10/12/2019   K 3.8 10/12/2019   CL 102 10/12/2019   CO2 23 10/12/2019   BUN 11 10/12/2019   CREATININE 0.65 10/12/2019   GFRNONAA >60 10/12/2019   GFRAA >60 10/12/2019   CALCIUM 9.3 10/12/2019    PROT 7.7 10/12/2019   ALBUMIN 4.2 10/12/2019   LABGLOB 2.6 07/28/2019   AGRATIO 1.7 07/28/2019   BILITOT 0.7 10/12/2019   ALKPHOS 87 10/12/2019   AST 19 10/12/2019   ALT 25 10/12/2019   ANIONGAP 14 10/12/2019      Objective    BP 121/83 (BP Location: Left Arm, Patient Position: Sitting, Cuff Size: Large)    Pulse (!) 102    Temp 98.4 F (36.9 C) (Oral)    Resp 16    Wt 158 lb (71.7 kg)    BMI 25.50 kg/m  BP Readings from Last 3 Encounters:  08/16/20 121/83  07/19/20 (!) 148/92  02/08/20 114/74   Wt Readings from Last 3 Encounters:  08/16/20 158 lb (71.7 kg)  07/19/20 163 lb (73.9 kg)  02/08/20 162 lb (73.5 kg)      Physical Exam Constitutional:      General: She is not in acute distress.    Appearance: Normal appearance. She is well-developed and normal weight. She is not ill-appearing or diaphoretic.  Cardiovascular:     Rate and Rhythm: Normal rate and regular rhythm.     Heart sounds: Normal heart sounds. No murmur heard.  No friction rub. No gallop.   Pulmonary:     Effort: Pulmonary effort is normal. No respiratory distress.     Breath sounds: Normal breath sounds. No wheezing or rales.  Abdominal:     General: Abdomen is flat. Bowel sounds are normal. There is no distension.     Palpations: Abdomen is soft. There is no mass.     Tenderness: There is abdominal tenderness in the suprapubic area. There is no guarding or rebound.  Skin:    General: Skin is warm and dry.  Neurological:     Mental Status: She is alert and oriented to person, place, and time.      Results for orders placed or performed in visit on 08/16/20  Urine Culture   Specimen: Urine   UR  Result Value Ref Range   Urine Culture, Routine Final report (A)    Organism ID, Bacteria Escherichia coli (A)    ORGANISM ID, BACTERIA Comment (A)    Antimicrobial Susceptibility Comment   Urine Microscopic  Result Value Ref Range   WBC, UA >30 (A) 0 - 5 /hpf   RBC >30 (A) 0 - 2 /hpf   Epithelial  Cells (non renal) >10 (A) 0 - 10 /hpf   Casts None seen None seen /lpf   Bacteria, UA Many (A) None seen/Few  POCT urinalysis dipstick  Result Value Ref Range   Color, UA orange    Clarity, UA Cloudy    Glucose, UA Negative Negative   Bilirubin, UA Negative    Ketones, UA Negative    Spec Grav, UA 1.010 1.010 - 1.025   Blood, UA Large    pH, UA 7.5 5.0 - 8.0   Protein, UA  Negative Negative   Urobilinogen, UA 0.2 0.2 or 1.0 E.U./dL   Nitrite, UA Negative    Leukocytes, UA Large (3+) (A) Negative   Appearance     Odor      Assessment & Plan     1. Dysuria UA positive.  - Urine Culture  2. Hematuria, unspecified type Noted on UA. Will send for microanalysis as below.  - Urine Microscopic  3. Antibiotic-induced yeast infection Gets yeast infections with antibiotics. Diflucan given as below.  - fluconazole (DIFLUCAN) 150 MG tablet; Take 1 tablet (150 mg total) by mouth once for 1 dose.  Dispense: 1 tablet; Refill: 0  4. Acute cystitis with hematuria Worsening symptoms. UA positive. Will treat empirically with Bactrim as below. Continue to push fluids. Urine sent for culture. Will follow up pending C&S results. She is to call if symptoms do not improve or if they worsen.  - sulfamethoxazole-trimethoprim (BACTRIM DS) 800-160 MG tablet; Take 1 tablet by mouth 2 (two) times daily.  Dispense: 14 tablet; Refill: 0   Return if symptoms worsen or fail to improve.      Reynolds Bowl, PA-C, have reviewed all documentation for this visit. The documentation on 08/20/20 for the exam, diagnosis, procedures, and orders are all accurate and complete.   Rubye Beach  [facility] [phone] (phone) [phone] (fax)  [place]

## 2020-08-16 NOTE — Patient Instructions (Signed)
Urinary Tract Infection, Adult A urinary tract infection (UTI) is an infection of any part of the urinary tract. The urinary tract includes:  The kidneys.  The ureters.  The bladder.  The urethra. These organs make, store, and get rid of pee (urine) in the body. What are the causes? This is caused by germs (bacteria) in your genital area. These germs grow and cause swelling (inflammation) of your urinary tract. What increases the risk? You are more likely to develop this condition if:  You have a small, thin tube (catheter) to drain pee.  You cannot control when you pee or poop (incontinence).  You are female, and: ? You use these methods to prevent pregnancy:  A medicine that kills sperm (spermicide).  A device that blocks sperm (diaphragm). ? You have low levels of a female hormone (estrogen). ? You are pregnant.  You have genes that add to your risk.  You are sexually active.  You take antibiotic medicines.  You have trouble peeing because of: ? A prostate that is bigger than normal, if you are female. ? A blockage in the part of your body that drains pee from the bladder (urethra). ? A kidney stone. ? A nerve condition that affects your bladder (neurogenic bladder). ? Not getting enough to drink. ? Not peeing often enough.  You have other conditions, such as: ? Diabetes. ? A weak disease-fighting system (immune system). ? Sickle cell disease. ? Gout. ? Injury of the spine. What are the signs or symptoms? Symptoms of this condition include:  Needing to pee right away (urgently).  Peeing often.  Peeing small amounts often.  Pain or burning when peeing.  Blood in the pee.  Pee that smells bad or not like normal.  Trouble peeing.  Pee that is cloudy.  Fluid coming from the vagina, if you are female.  Pain in the belly or lower back. Other symptoms include:  Throwing up (vomiting).  No urge to eat.  Feeling mixed up (confused).  Being tired  and grouchy (irritable).  A fever.  Watery poop (diarrhea). How is this treated? This condition may be treated with:  Antibiotic medicine.  Other medicines.  Drinking enough water. Follow these instructions at home:  Medicines  Take over-the-counter and prescription medicines only as told by your doctor.  If you were prescribed an antibiotic medicine, take it as told by your doctor. Do not stop taking it even if you start to feel better. General instructions  Make sure you: ? Pee until your bladder is empty. ? Do not hold pee for a long time. ? Empty your bladder after sex. ? Wipe from front to back after pooping if you are a female. Use each tissue one time when you wipe.  Drink enough fluid to keep your pee pale yellow.  Keep all follow-up visits as told by your doctor. This is important. Contact a doctor if:  You do not get better after 1-2 days.  Your symptoms go away and then come back. Get help right away if:  You have very bad back pain.  You have very bad pain in your lower belly.  You have a fever.  You are sick to your stomach (nauseous).  You are throwing up. Summary  A urinary tract infection (UTI) is an infection of any part of the urinary tract.  This condition is caused by germs in your genital area.  There are many risk factors for a UTI. These include having a small, thin   tube to drain pee and not being able to control when you pee or poop.  Treatment includes antibiotic medicines for germs.  Drink enough fluid to keep your pee pale yellow. This information is not intended to replace advice given to you by your health care provider. Make sure you discuss any questions you have with your health care provider. Document Revised: 10/15/2018 Document Reviewed: 05/07/2018 Elsevier Patient Education  2020 [employer]vier Inc[employer]  

## 2020-08-17 LAB — URINALYSIS, MICROSCOPIC ONLY
Casts: NONE SEEN /lpf
Epithelial Cells (non renal): >10 /hpf — AB (ref 0–10)
RBC, Urine: >30 /hpf — AB (ref 0–2)
WBC, UA: >30 /hpf — AB (ref 0–5)

## 2020-08-18 ENCOUNTER — Telehealth: Payer: Self-pay

## 2020-08-18 ENCOUNTER — Other Ambulatory Visit: Payer: Self-pay | Admitting: Physician Assistant

## 2020-08-18 DIAGNOSIS — E1165 Type 2 diabetes mellitus with hyperglycemia: Secondary | ICD-10-CM

## 2020-08-18 LAB — URINE CULTURE

## 2020-08-18 NOTE — Telephone Encounter (Signed)
-----   Message from Mar Daring, PA-C sent at 08/18/2020  4:50 PM EDT ----- Urine culture is positive for e.coli. It is sensitive to bactrim. Continue until completed.

## 2020-08-18 NOTE — Telephone Encounter (Signed)
Pt advised.   Thanks,   -Nera Haworth  

## 2020-08-19 ENCOUNTER — Other Ambulatory Visit: Payer: Self-pay | Admitting: Physician Assistant

## 2020-08-19 DIAGNOSIS — E1165 Type 2 diabetes mellitus with hyperglycemia: Secondary | ICD-10-CM

## 2020-08-31 ENCOUNTER — Other Ambulatory Visit: Payer: Self-pay | Admitting: Physician Assistant

## 2020-08-31 DIAGNOSIS — I10 Essential (primary) hypertension: Secondary | ICD-10-CM

## 2020-10-18 ENCOUNTER — Ambulatory Visit: Payer: Self-pay | Admitting: Physician Assistant

## 2020-10-25 ENCOUNTER — Other Ambulatory Visit: Payer: Self-pay | Admitting: Physician Assistant

## 2020-10-25 DIAGNOSIS — E1165 Type 2 diabetes mellitus with hyperglycemia: Secondary | ICD-10-CM

## 2020-10-25 NOTE — Telephone Encounter (Signed)
Requested medication (s) are due for refill today -yes  Requested medication (s) are on the active medication list -yes  Future visit scheduled -no  Last refill: 07/19/20  Notes to clinic: Request medication not assigned protocol  Requested Prescriptions  Pending Prescriptions Disp Refills   RYBELSUS 3 MG TABS [Pharmacy Med Name: RYBELSUS 3MG  TABLETS] 90 tablet 0    Sig: TAKE 1 TABLET BY MOUTH DAILY      Off-Protocol Failed - 10/25/2020 10:10 AM      Failed - Medication not assigned to a protocol, review manually.      Passed - Valid encounter within last 12 months    Recent Outpatient Visits           2 months ago [place], Anderson [place] M, [place]   3 months ago Type 2 diabetes mellitus with hyperglycemia, without long-term current use of insulin [facility])   [place], [place], [place]   8 months ago Type 2 diabetes mellitus with hyperglycemia, without long-term current use of insulin [facility])   [facility] [place], Fabio Bering M, [place]   11 months ago Type 2 diabetes mellitus with hyperglycemia, without long-term current use of insulin [facility])   [facility] [place], [place], [place]   1 year ago Type 2 diabetes mellitus with hyperglycemia, without long-term current use of insulin [facility])   [place], [place], [place]                    Requested Prescriptions  Pending Prescriptions Disp Refills   RYBELSUS 3 MG TABS [Pharmacy Med Name: RYBELSUS 3MG  TABLETS] 90 tablet 0    Sig: TAKE 1 TABLET BY MOUTH DAILY      Off-Protocol Failed - 10/25/2020 10:10 AM      Failed - Medication not assigned to a protocol, review manually.      Passed - Valid encounter within last 12 months    Recent Outpatient Visits           2 months ago [place], Anderson Malta M, [place]   3 months ago Type 2 diabetes mellitus with hyperglycemia, without long-term current use  of insulin [facility])   [place], [place], [place]   8 months ago Type 2 diabetes mellitus with hyperglycemia, without long-term current use of insulin [facility])   [facility] Carles Collet M, [place]   11 months ago Type 2 diabetes mellitus with hyperglycemia, without long-term current use of insulin [facility])   [facility] [place], [place], [place]   1 year ago Type 2 diabetes mellitus with hyperglycemia, without long-term current use of insulin [facility])   [place], [place], [place]

## 2020-10-27 NOTE — Telephone Encounter (Signed)
L.O.V. was on 07/19/2020 and no upcoming appointment. Medication was send into pharmacy.

## 2020-12-21 ENCOUNTER — Other Ambulatory Visit: Payer: Self-pay | Admitting: Physician Assistant

## 2020-12-21 DIAGNOSIS — F411 Generalized anxiety disorder: Secondary | ICD-10-CM

## 2020-12-21 NOTE — Telephone Encounter (Signed)
She needs to schedule and attend a follow up for diabetes and chronic issues before more refills.

## 2020-12-21 NOTE — Telephone Encounter (Signed)
Requested medications are due for refill today yes  Requested medications are on the active medication list yes  Last refill 11/6  Last visit 07/2019 last visit this med/dx addressed  Future visit scheduled no  Notes to clinic Failed protocol due to no valid visit within 6  months, no upcoming visit scheduled.

## 2021-03-26 ENCOUNTER — Telehealth: Payer: Self-pay | Admitting: Family Medicine

## 2021-03-26 DIAGNOSIS — F411 Generalized anxiety disorder: Secondary | ICD-10-CM

## 2021-03-26 DIAGNOSIS — I10 Essential (primary) hypertension: Secondary | ICD-10-CM

## 2021-03-26 NOTE — Telephone Encounter (Signed)
[facility] faxed refill request for the following medications:  1. sertraline (ZOLOFT) 100 MG tablet  2. lisinopril (ZESTRIL) 10 MG tablet  LOV: 08/16/20 Please advise. Thanks TNP

## 2021-03-27 NOTE — Telephone Encounter (Signed)
Patient is overdue for follow up appointment. Tried calling patient. No answer. Unable to leave message due to patient voicemail not set up yet. I tried calling pharmacy to inform them that patient needs to schedule appointment. I was on hold with pharmacy for over 10 minutes, before my call was disconnected. I tried calling pharmacy a second time and was on hold for an additional 15 minutes. Throughout my hold time with the pharmacy someone continued to pick up the phone without saying anything, and would then put me back on hold.

## 2021-03-30 MED ORDER — LISINOPRIL 10 MG PO TABS: 10.0000 mg | ORAL_TABLET | Freq: Every day | ORAL | 0 refills | Status: DC

## 2021-03-30 MED ORDER — SERTRALINE HCL 100 MG PO TABS: 100.0000 mg | ORAL_TABLET | Freq: Every day | ORAL | 0 refills | Status: DC

## 2021-03-30 NOTE — Telephone Encounter (Signed)
Appointment has been scheduled with Cassandra Butler on 04/16/2021 at 4pm. Patient doesn't have insurance and wants to know what the average out of pocket cost will be for self pay patients. Please call patient to advise. [phone].

## 2021-04-02 ENCOUNTER — Other Ambulatory Visit: Payer: Self-pay

## 2021-04-02 DIAGNOSIS — F411 Generalized anxiety disorder: Secondary | ICD-10-CM

## 2021-04-02 DIAGNOSIS — I10 Essential (primary) hypertension: Secondary | ICD-10-CM

## 2021-04-02 MED ORDER — LISINOPRIL 10 MG PO TABS: 10.0000 mg | ORAL_TABLET | Freq: Every day | ORAL | 0 refills | Status: DC

## 2021-04-02 MED ORDER — SERTRALINE HCL 100 MG PO TABS: 100.0000 mg | ORAL_TABLET | Freq: Every day | ORAL | 0 refills | Status: DC

## 2021-04-02 NOTE — Telephone Encounter (Signed)
Patient advised between $75.00-$100.00.

## 2021-04-02 NOTE — Telephone Encounter (Signed)
Pt called back saying she has not gotten the two refill that she had requested earlier.  She needs the Lisinopril and Zoloft ASAP.

## 2021-04-16 ENCOUNTER — Ambulatory Visit: Payer: Self-pay | Admitting: Family Medicine

## 2021-04-23 ENCOUNTER — Ambulatory Visit (INDEPENDENT_AMBULATORY_CARE_PROVIDER_SITE_OTHER): Payer: Self-pay | Admitting: Family Medicine

## 2021-04-23 ENCOUNTER — Other Ambulatory Visit: Payer: Self-pay

## 2021-04-23 ENCOUNTER — Encounter: Payer: Self-pay | Admitting: Family Medicine

## 2021-04-23 VITALS — BP 113/78 | HR 90 | Temp 98.5°F | Resp 16 | Ht 66.0 in | Wt 154.0 lb

## 2021-04-23 DIAGNOSIS — F411 Generalized anxiety disorder: Secondary | ICD-10-CM

## 2021-04-23 DIAGNOSIS — E1165 Type 2 diabetes mellitus with hyperglycemia: Secondary | ICD-10-CM

## 2021-04-23 DIAGNOSIS — Z720 Tobacco use: Secondary | ICD-10-CM

## 2021-04-23 DIAGNOSIS — I10 Essential (primary) hypertension: Secondary | ICD-10-CM

## 2021-04-23 DIAGNOSIS — E785 Hyperlipidemia, unspecified: Secondary | ICD-10-CM

## 2021-04-23 DIAGNOSIS — M722 Plantar fascial fibromatosis: Secondary | ICD-10-CM

## 2021-04-23 LAB — POCT GLYCOSYLATED HEMOGLOBIN (HGB A1C)
Est. average glucose Bld gHb Est-mCnc: 123
Hemoglobin A1C: 5.9 % — AB (ref 4.0–5.6)

## 2021-04-23 LAB — POCT UA - MICROALBUMIN: Microalbumin Ur, POC: 50 mg/L

## 2021-04-23 NOTE — Progress Notes (Signed)
Established patient visit   Patient: Cassandra Butler   DOB: 02-22-1966   55 y.o. Female  MRN: [id number] Visit Date: 04/23/2021  Today's healthcare provider: Vernie Murders, PA-C   Chief Complaint  Patient presents with   Hypertension   Diabetes   Hyperlipidemia   Subjective    HPI  Diabetes Mellitus Type II, follow-up  Lab Results  Component Value Date   HGBA1C 5.9 (A) 04/23/2021   HGBA1C 7.9 (A) 07/19/2020   HGBA1C 7.7 (A) 02/08/2020   Last seen for diabetes 6 months ago.  Management since then includes try rybelsus 24m daily She reports  stopping medications in November, 2021  compliance with treatment. She is not having side effects.  Home blood sugar records: fasting range: 90-100  Episodes of hypoglycemia? No   Current insulin regiment: none Most Recent Eye Exam: will schedule  --------------------------------------------------------------------------------------------------- Hypertension, follow-up  BP Readings from Last 3 Encounters:  04/23/21 113/78  08/16/20 121/83  07/19/20 (!) 148/92   Wt Readings from Last 3 Encounters:  04/23/21 154 lb (69.9 kg)  08/16/20 158 lb (71.7 kg)  07/19/20 163 lb (73.9 kg)     She was last seen for hypertension 6 months ago.  BP at that visit was 148/92. Management since that visit includes no changes. She reports excellent compliance with treatment. She is not having side effects. She is exercising. She is adherent to low salt diet.   Outside blood pressures are stable.  She does smoke.  Use of agents associated with hypertension: none.   --------------------------------------------------------------------------------------------------- Lipid/Cholesterol, follow-up  Last Lipid Panel: Lab Results  Component Value Date   CHOL 140 07/28/2019   LDLCALC 67 12/31/2019   LDLDIRECT 37 07/28/2019   HDL 36 12/31/2019   TRIG 495 (H) 07/28/2019    She was last seen for this 6 months ago.  Management  since that visit includes no changes.  She reports  stopping medication in Novmenber 2021.   She is not having side effects.  Symptoms: No appetite changes No foot ulcerations  No chest pain No chest pressure/discomfort  No dyspnea No orthopnea  No fatigue No lower extremity edema  No palpitations No paroxysmal nocturnal dyspnea  No nausea No numbness or tingling of extremity  No polydipsia No polyuria  No speech difficulty No syncope   She is following a Low fat, Low Sodium, Diabetic diet. Current exercise: walking  Last metabolic panel Lab Results  Component Value Date   GLUCOSE 166 (H) 10/12/2019   NA 139 10/12/2019   K 3.8 10/12/2019   BUN 11 10/12/2019   CREATININE 0.65 10/12/2019   GFRNONAA >60 10/12/2019   GFRAA >60 10/12/2019   CALCIUM 9.3 10/12/2019   AST 19 10/12/2019   ALT 25 10/12/2019   The 10-year ASCVD risk score (Mikey BussingDC Jr., et al., 2013) is: 8.7%* (Cholesterol units were assumed)  --------------------------------------------------------------------------------------------------- Anxiety, Follow-up  She was last seen for anxiety 6 months ago. Changes made at last visit include no changes.   She reports excellent compliance with treatment. She reports excellent tolerance of treatment. She is not having side effects.  She feels her anxiety is mild and Improved since last visit.  Symptoms: No chest pain No difficulty concentrating  No dizziness No fatigue  No feelings of losing control No insomnia  No irritable No palpitations  No panic attacks No racing thoughts  No shortness of breath No sweating  No tremors/shakes    GAD-7 Results GAD-7 Generalized Anxiety Disorder  Screening Tool 04/23/2021  1. Feeling Nervous, Anxious, or on Edge 0  2. Not Being Able to Stop or Control Worrying 0  3. Worrying Too Much About Different Things 0  4. Trouble Relaxing 0  5. Being So Restless it's Hard To Sit Still 0  6. Becoming Easily Annoyed or Irritable 0   7. Feeling Afraid As If Something Awful Might Happen 0  Total GAD-7 Score 0  Difficulty At Work, Home, or Getting  Along With Others? Not difficult at all    PHQ-9 Scores PHQ9 SCORE ONLY 04/23/2021 08/16/2020 07/28/2019  PHQ-9 Total Score 0 0 1    ---------------------------------------------------------------------------------------------------   Patient Active Problem List   Diagnosis Date Noted   Osteopenia of multiple sites 10/08/2016   Aromatase inhibitor use 10/08/2016   Type 2 diabetes mellitus with hyperglycemia ([place]) 02/01/2016   Breast CA ([place]) 04/04/2015   HLD (hyperlipidemia) 04/04/2015   Hypertriglyceridemia 04/04/2015   Obstructive sleep apnea of adult 04/04/2015   Malignant neoplasm of breast ([place]) 05/18/2012   Avitaminosis D 02/14/2010   Essential (primary) hypertension 08/07/2009   Family history of cardiovascular disease 02/09/2008   Current tobacco use 01/27/2008   Anxiety disorder 12/18/2004   Acid reflux 12/18/2004   Past Surgical History:  Procedure Laterality Date   ABDOMINAL HYSTERECTOMY  2004   total, due to fibriods. Also had ovaries removed after 1 year of breast cancer.    BREAST LUMPECTOMY Left 2010   COLONOSCOPY     25 yrs ago   COLONOSCOPY  04/16/2010   Dr Candace Cruise   Family History  Problem Relation Age of Onset   Diabetes Mother     Social History   Tobacco Use   Smoking status: Every Day    Packs/day: 1.00    Pack years: 0.00    Types: Cigarettes   Smokeless tobacco: Never  Substance Use Topics   Alcohol use: No   Drug use: No   No Known Allergies     Medications: Outpatient Medications Prior to Visit  Medication Sig   Cholecalciferol (VITAMIN D3) 2000 units TABS Take by mouth.   lisinopril (ZESTRIL) 10 MG tablet Take 1 tablet (10 mg total) by mouth daily.   Omega-3 Fatty Acids (FISH OIL) 1000 MG CAPS Take by mouth daily.   omeprazole (PRILOSEC) 20 MG capsule TAKE 1 CAPSULE(20 MG) BY MOUTH DAILY   sertraline (ZOLOFT) 100 MG  tablet Take 1 tablet (100 mg total) by mouth daily.   [DISCONTINUED] atorvastatin (LIPITOR) 80 MG tablet TAKE 1 TABLET(80 MG) BY MOUTH DAILY   [DISCONTINUED] BAYER CONTOUR NEXT TEST test strip CHECK SUGAR D   [DISCONTINUED] blood glucose meter kit and supplies KIT Check sugar daily   [DISCONTINUED] glipiZIDE (GLUCOTROL) 10 MG tablet TAKE 1 TABLET BY MOUTH TWICE DAILY BEFORE MEALS 30 MINUTES BEFORE EATING   [DISCONTINUED] ibuprofen (ADVIL,MOTRIN) 200 MG tablet Take 200 mg by mouth every 6 (six) hours as needed.   [DISCONTINUED] metFORMIN (GLUCOPHAGE-XR) 500 MG 24 hr tablet TAKE 2 TABLETS(1000 MG) BY MOUTH TWICE DAILY   [DISCONTINUED] MICROLET LANCETS MISC CHECK SUGAR D   [DISCONTINUED] nystatin ointment (MYCOSTATIN) APP EXT AA BID   [DISCONTINUED] Semaglutide (RYBELSUS) 3 MG TABS TAKE 1 TABLET BY MOUTH DAILY   [DISCONTINUED] triamcinolone cream (KENALOG) 0.1 % Apply 1 application topically 2 (two) times daily.   No facility-administered medications prior to visit.    Review of Systems  Constitutional:  Negative for activity change, appetite change and fatigue.  Eyes:  Negative for visual  disturbance.  Respiratory:  Negative for chest tightness.        Objective    BP 113/78 (BP Location: Right Arm, Patient Position: Sitting, Cuff Size: Normal)   Pulse 90   Temp 98.5 F (36.9 C) (Oral)   Resp 16   Ht _0  (1.676 m)   Wt 154 lb (69.9 kg)   SpO2 99%   BMI 24.86 kg/m  BP Readings from Last 3 Encounters:  04/23/21 113/78  08/16/20 121/83  07/19/20 (!) 148/92   Wt Readings from Last 3 Encounters:  04/23/21 154 lb (69.9 kg)  08/16/20 158 lb (71.7 kg)  07/19/20 163 lb (73.9 kg)    Physical Exam Constitutional:      General: She is not in acute distress.    Appearance: She is well-developed.  HENT:     Head: Normocephalic and atraumatic.     Right Ear: Hearing normal.     Left Ear: Hearing normal.     Nose: Nose normal.  Eyes:     General: Lids are normal. No scleral  icterus.       Right eye: No discharge.        Left eye: No discharge.     Conjunctiva/sclera: Conjunctivae normal.  Pulmonary:     Effort: Pulmonary effort is normal. No respiratory distress.  Musculoskeletal:        General: Normal range of motion.  Skin:    Findings: No lesion or rash.  Neurological:     Mental Status: She is alert and oriented to person, place, and time.  Psychiatric:        Speech: Speech normal.        Behavior: Behavior normal.        Thought Content: Thought content normal.      Results for orders placed or performed in visit on 04/23/21  POCT glycosylated hemoglobin (Hb A1C)  Result Value Ref Range   Hemoglobin A1C 5.9 (A) 4.0 - 5.6 %   Est. average glucose Bld gHb Est-mCnc 123   POCT UA - Microalbumin  Result Value Ref Range   Microalbumin Ur, POC 50 mg/L    Assessment & Plan     1. Essential (primary) hypertension Well controlled BP with Lisinopril 10 mg qd. Recommend she stop all smoking and restrict salt intake. Recheck labs and follow up pending reports. - CBC with Differential/Platelet - Comprehensive metabolic panel - Lipid panel - TSH  2. Type 2 diabetes mellitus with hyperglycemia, without long-term current use of insulin (HCC) Stopped all diabetes medications and controlling blood sugar by lifestyle changes. Monitoring glucose level by the Dexcom meter. Hgv A1C is 5.9% today by diet only. Recheck labs and recheck pending reports. - POCT glycosylated hemoglobin (Hb A1C) - POCT UA - Microalbumin - Comprehensive metabolic panel - Lipid panel  3. Hyperlipidemia, unspecified hyperlipidemia type Was on Atorvastatin 80 mg qd. Will recheck labs to see if she can stay off it. - Comprehensive metabolic panel - Lipid panel  4. Generalized anxiety disorder Well controlled with Zoloft 100 mg qd. Tried to stop it but developed a headache after stopping it abruptly. Headache stopped when it was restarted. Recheck labs. If she decides to come off  it, will need to taper down more slowly. - CBC with Differential/Platelet - TSH  5. Current tobacco use Has been smoking 1 ppd for the past 35 years. No respiratory problems. Encouraged to stop all smoking.  6. Plantar fasciitis of left foot Reports tenderness of the left  heel anteriorly for the past couple weeks. Recommend NSAID of choice and ice massage with stretching exercises. Given fasciitis handout   No follow-ups on file.      I, Chaye Misch, PA-C, have reviewed all documentation for this visit. The documentation on 04/23/21 for the exam, diagnosis, procedures, and orders are all accurate and complete.    Vernie Murders, PA-C  [employer] [phone] (phone) [phone] (fax)  [place]

## 2021-04-23 NOTE — Patient Instructions (Signed)
Plantar Fasciitis  Plantar fasciitis is a painful foot condition that affects the heel. It occurs when the band of tissue that connects the toes to the heel bone (plantar fascia) becomes irritated. This can happen as the result of exercising too much or doing other repetitive activities (overuse injury). Plantar fasciitis can cause mild irritation to severe pain that makes it difficult to walk or move. The pain is usually worse in the morning after sleeping, or after sitting or lying down for a period of time. Pain may also beworse after long periods of walking or standing. What are the causes? This condition may be caused by: Standing for long periods of time. Wearing shoes that do not have good arch support. Doing activities that put stress on joints (high-impact activities). This includes ballet and exercise that makes your heart beat faster (aerobic exercise), such as running. Being overweight. An abnormal way of walking (gait). Tight muscles in the back of your lower leg (calf). High arches in your feet or flat feet. Starting a new athletic activity. What are the signs or symptoms? The main symptom of this condition is heel pain. Pain may get worse after the following: Taking the first steps after a time of rest, especially in the morning after awakening, or after you have been sitting or lying down for a while. Long periods of standing still. Pain may decrease after 30-45 minutes of activity, such as gentle walking. How is this diagnosed? This condition may be diagnosed based on your medical history, a physical exam, and your symptoms. Your health care provider will check for: A tender area on the bottom of your foot. A high arch in your foot or flat feet. Pain when you move your foot. Difficulty moving your foot. You may have imaging tests to confirm the diagnosis, such as: X-rays. Ultrasound. MRI. How is this treated? Treatment for plantar fasciitis depends on how severe your  condition is. Treatment may include: Rest, ice, pressure (compression), and raising (elevating) the affected foot. This is called RICE therapy. Your health care provider may recommend RICE therapy along with over-the-counter pain medicines to manage your pain. Exercises to stretch your calves and your plantar fascia. A splint that holds your foot in a stretched, upward position while you sleep (night splint). Physical therapy to relieve symptoms and prevent problems in the future. Injections of steroid medicine (cortisone) to relieve pain and inflammation. Stimulating your plantar fascia with electrical impulses (extracorporeal shock wave therapy). This is usually the last treatment option before surgery. Surgery, if other treatments have not worked after 12 months. Follow these instructions at home: Managing pain, stiffness, and swelling  If directed, put ice on the painful area. To do this: Put ice in a plastic bag, or use a frozen bottle of water. Place a towel between your skin and the bag or bottle. Roll the bottom of your foot over the bag or bottle. Do this for 20 minutes, 2-3 times a day. Wear athletic shoes that have air-sole or gel-sole cushions, or try soft shoe inserts that are designed for plantar fasciitis. Elevate your foot above the level of your heart while you are sitting or lying down.  Activity Avoid activities that cause pain. Ask your health care provider what activities are safe for you. Do physical therapy exercises and stretches as told by your health care provider. Try activities and forms of exercise that are easier on your joints (low impact). Examples include swimming, water aerobics, and biking. General instructions Take over-the-counter   and prescription medicines only as told by your health care provider. Wear a night splint while sleeping, if told by your health care provider. Loosen the splint if your toes tingle, become numb, or turn cold and blue. Maintain  a healthy weight, or work with your health care provider to lose weight as needed. Keep all follow-up visits. This is important. Contact a health care provider if you have: Symptoms that do not go away with home treatment. Pain that gets worse. Pain that affects your ability to move or do daily activities. Summary Plantar fasciitis is a painful foot condition that affects the heel. It occurs when the band of tissue that connects the toes to the heel bone (plantar fascia) becomes irritated. Heel pain is the main symptom of this condition. It may get worse after exercising too much or standing still for a long time. Treatment varies, but it usually starts with rest, ice, pressure (compression), and raising (elevating) the affected foot. This is called RICE therapy. Over-the-counter medicines can also be used to manage pain. This information is not intended to replace advice given to you by your health care provider. Make sure you discuss any questions you have with your healthcare provider. Document Revised: 02/14/2020 Document Reviewed: 02/14/2020 Elsevier Patient Education  2022 Elsevier Inc.  

## 2021-06-13 ENCOUNTER — Other Ambulatory Visit: Payer: Self-pay

## 2021-06-13 ENCOUNTER — Ambulatory Visit
Admission: EM | Admit: 2021-06-13 | Discharge: 2021-06-13 | Disposition: A | Discharge status: Home / Self Care | Payer: Self-pay | Attending: Family Medicine | Admitting: Family Medicine

## 2021-06-13 ENCOUNTER — Encounter: Payer: Self-pay | Admitting: Emergency Medicine

## 2021-06-13 DIAGNOSIS — N3090 Cystitis, unspecified without hematuria: Secondary | ICD-10-CM | POA: Insufficient documentation

## 2021-06-13 LAB — POCT URINALYSIS DIP (MANUAL ENTRY)
Bilirubin, UA: NEGATIVE
Glucose, UA: NEGATIVE mg/dL
Ketones, POC UA: NEGATIVE mg/dL
Nitrite, UA: NEGATIVE
Protein Ur, POC: NEGATIVE mg/dL
Spec Grav, UA: 1.015 (ref 1.010–1.025)
Urobilinogen, UA: 0.2 E.U./dL
pH, UA: 6 (ref 5.0–8.0)

## 2021-06-13 MED ORDER — CEPHALEXIN 500 MG PO CAPS: 500.0000 mg | ORAL_CAPSULE | Freq: Two times a day (BID) | ORAL | 0 refills | Status: DC

## 2021-06-13 MED ORDER — FLUCONAZOLE 150 MG PO TABS: ORAL_TABLET | ORAL | 0 refills | Status: DC

## 2021-06-13 MED ORDER — FLUCONAZOLE 150 MG PO TABS
ORAL_TABLET | ORAL | 0 refills | Status: DC
Start: 2021-06-13 — End: 2021-06-13

## 2021-06-13 NOTE — ED Triage Notes (Signed)
Right  lower back pain and abd pressure on right side x 1 week.  States she only has one kidney.

## 2021-06-13 NOTE — ED Provider Notes (Signed)
[facility]    ASSESSMENT & PLAN:  1. Cystitis   Begin: Meds ordered this encounter  Medications   fluconazole (DIFLUCAN) 150 MG tablet    Sig: Take one tablet by mouth as a single dose. May repeat in 3 days if symptoms persist.    Dispense:  2 tablet    Refill:  0   cephALEXin (KEFLEX) 500 MG capsule    Sig: Take 1 capsule (500 mg total) by mouth 2 (two) times daily.    Dispense:  10 capsule    Refill:  0   No signs of pyelonephritis. Discussed. Urine culture sent. Will follow up with her PCP or here if not showing improvement over the next 48 hours, sooner if needed.  Outlined signs and symptoms indicating need for more acute intervention. Patient verbalized understanding. After Visit Summary given.  SUBJECTIVE:  Cassandra Butler is a 55 y.o. female who complains of urinary frequency, urgency and dysuria for the past week. Without associated flank pain, fever, chills, vaginal discharge or bleeding. Gross hematuria: not present. No specific aggravating or alleviating factors reported. No LE edema. Normal PO intake without n/v/d. Without specific abdominal pain. Ambulatory without difficulty. OTC treatment: none reported.  LMP: No LMP recorded. Patient has had a hysterectomy.   OBJECTIVE:  Vitals:   06/13/21 1313  BP: 106/65  Pulse: 97  Resp: 16  Temp: 97.6 F (36.4 C)  TempSrc: Temporal  SpO2: 95%   General appearance: alert; no distress HENT: oropharynx: moist Lungs: unlabored respirations Back: mild R CVA tenderness Extremities: no edema; symmetrical with no gross deformities Skin: warm and dry Neurologic: normal gait Psychological: alert and cooperative; normal mood and affect  Labs Reviewed  POCT URINALYSIS DIP (MANUAL ENTRY) - Abnormal; Notable for the following components:      Result Value   Blood, UA trace-intact (*)    Leukocytes, UA Trace (*)    All other components within normal limits  URINE CULTURE    No Known  Allergies  Past Medical History:  Diagnosis Date   Cancer ([place]) 11/14/2007   Left breast wide excision, sentinel node biopsy. 1.4 cm, T1c, N0: ER 90%; PR 905; Her 2 neu not over expressed.    Diabetes mellitus without complication (HCC)    GERD (gastroesophageal reflux disease)    Hyperlipidemia    Hypertension    Vitamin D deficiency    Social History   Socioeconomic History   Marital status: Married    Spouse name: Not on file   Number of children: Not on file   Years of education: Not on file   Highest education level: Not on file  Occupational History   Not on file  Tobacco Use   Smoking status: Every Day    Packs/day: 1.00    Types: Cigarettes   Smokeless tobacco: Never  Substance and Sexual Activity   Alcohol use: No   Drug use: No   Sexual activity: Not on file  Other Topics Concern   Not on file  Social History Narrative   Not on file   Social Determinants of Health   Financial Resource Strain: Not on file  Food Insecurity: Not on file  Transportation Needs: Not on file  Physical Activity: Not on file  Stress: Not on file  Social Connections: Not on file  Intimate Partner Violence: Not on file   Family History  Problem Relation Age of Onset   Diabetes Mother         Mannie Stabile,  Aaron Edelman, MD 06/13/21 [phone]

## 2021-06-15 LAB — URINE CULTURE

## 2021-06-27 ENCOUNTER — Other Ambulatory Visit: Payer: Self-pay

## 2021-06-27 ENCOUNTER — Ambulatory Visit
Admission: EM | Admit: 2021-06-27 | Discharge: 2021-06-27 | Disposition: A | Discharge status: Home / Self Care | Payer: Self-pay | Attending: Family Medicine | Admitting: Family Medicine

## 2021-06-27 DIAGNOSIS — R109 Unspecified abdominal pain: Secondary | ICD-10-CM

## 2021-06-27 DIAGNOSIS — R062 Wheezing: Secondary | ICD-10-CM

## 2021-06-27 DIAGNOSIS — J069 Acute upper respiratory infection, unspecified: Secondary | ICD-10-CM

## 2021-06-27 LAB — POCT URINALYSIS DIP (MANUAL ENTRY)
Bilirubin, UA: NEGATIVE
Blood, UA: NEGATIVE
Glucose, UA: NEGATIVE mg/dL
Ketones, POC UA: NEGATIVE mg/dL
Leukocytes, UA: NEGATIVE
Nitrite, UA: NEGATIVE
Protein Ur, POC: NEGATIVE mg/dL
Spec Grav, UA: 1.02 (ref 1.010–1.025)
Urobilinogen, UA: 0.2 E.U./dL
pH, UA: 6 (ref 5.0–8.0)

## 2021-06-27 MED ORDER — PREDNISONE 20 MG PO TABS: 40.0000 mg | ORAL_TABLET | Freq: Every day | ORAL | 0 refills | Status: DC

## 2021-06-27 MED ORDER — BENZONATATE 100 MG PO CAPS: ORAL_CAPSULE | ORAL | 0 refills | Status: DC

## 2021-06-27 NOTE — ED Provider Notes (Signed)
[facility]   [id number] 06/27/21 Arrival Time: [id number]  ASSESSMENT & PLAN:  1. Viral URI with cough   2. Wheezing   3. Right flank pain    U/A without signs of infection. Discussed typical duration of viral illnesses. COVID-19 testing declined. OTC symptom care as needed.  Begin: Meds ordered this encounter  Medications   predniSONE (DELTASONE) 20 MG tablet    Sig: Take 2 tablets (40 mg total) by mouth daily.    Dispense:  10 tablet    Refill:  0   benzonatate (TESSALON) 100 MG capsule    Sig: Take 1 capsule by mouth every 8 (eight) hours for cough.    Dispense:  21 capsule    Refill:  0     Follow-up Information     Bacigalupo, Dionne Bucy, MD.   Specialty: Family Medicine Why: As needed. Contact information: [address] [place] [address] [phone]         [facility] [place].   Specialty: Urgent Care Why: If worsening or failing to improve as anticipated. Contact information: [address] [place] [phone]                Reviewed expectations re: course of current medical issues. Questions answered. Outlined signs and symptoms indicating need for more acute intervention. Understanding verbalized. After Visit Summary given.   SUBJECTIVE: History from: patient. Cassandra Butler is a 55 y.o. female who reports: body aches, nasal congestion, dry cough, ques wheezing; abrupt onset x 2-3 d. Husband with similar; is improving. Home COVID test negative. Denies: difficulty breathing. Normal PO intake without n/v/d. Also reports R flank pain; past couple of days. No urin freq or dysuria. No gross hematuria.  Social History   Tobacco Use  Smoking Status Every Day   Packs/day: 1.00   Types: Cigarettes  Smokeless Tobacco Never     OBJECTIVE:  Vitals:   06/27/21 1343  BP: 111/66  Pulse: (!) 106  Resp: 18  Temp: 98.5 F (36.9 C)  SpO2: 96%     General appearance: alert; no distress; fatigued Eyes: PERRLA; EOMI; conjunctiva normal HENT: [place]; AT; with nasal congestion Neck: supple  Lungs: speaks full sentences without difficulty; unlabored; bilateral exp wheezing Extremities: no edema Skin: warm and dry Neurologic: normal gait Psychological: alert and cooperative; normal mood and affect  Labs: Results for orders placed or performed during the hospital encounter of 06/27/21  POCT urinalysis dipstick  Result Value Ref Range   Color, UA yellow yellow   Clarity, UA clear clear   Glucose, UA negative negative mg/dL   Bilirubin, UA negative negative   Ketones, POC UA negative negative mg/dL   Spec Grav, UA 1.020 1.010 - 1.025   Blood, UA negative negative   pH, UA 6.0 5.0 - 8.0   Protein Ur, POC negative negative mg/dL   Urobilinogen, UA 0.2 0.2 or 1.0 E.U./dL   Nitrite, UA Negative Negative   Leukocytes, UA Negative Negative   Labs Reviewed  POCT URINALYSIS DIP (MANUAL ENTRY)    No Known Allergies  Past Medical History:  Diagnosis Date   Cancer ([place]) 11/14/2007   Left breast wide excision, sentinel node biopsy. 1.4 cm, T1c, N0: ER 90%; PR 905; Her 2 neu not over expressed.    Diabetes mellitus without complication (HCC)    GERD (gastroesophageal reflux disease)    Hyperlipidemia    Hypertension    Vitamin D deficiency  Social History   Socioeconomic History   Marital status: Married    Spouse name: Not on file   Number of children: Not on file   Years of education: Not on file   Highest education level: Not on file  Occupational History   Not on file  Tobacco Use   Smoking status: Every Day    Packs/day: 1.00    Types: Cigarettes   Smokeless tobacco: Never  Substance and Sexual Activity   Alcohol use: No   Drug use: No   Sexual activity: Not on file  Other Topics Concern   Not on file  Social History Narrative   Not on file   Social Determinants of Health   Financial Resource Strain: Not on  file  Food Insecurity: Not on file  Transportation Needs: Not on file  Physical Activity: Not on file  Stress: Not on file  Social Connections: Not on file  Intimate Partner Violence: Not on file   Family History  Problem Relation Age of Onset   Diabetes Mother    Past Surgical History:  Procedure Laterality Date   ABDOMINAL HYSTERECTOMY  2004   total, due to fibriods. Also had ovaries removed after 1 year of breast cancer.    BREAST LUMPECTOMY Left 2010   COLONOSCOPY     25 yrs ago   COLONOSCOPY  04/16/2010   Dr Mickey Farber, MD 06/27/21 1414

## 2021-06-27 NOTE — ED Triage Notes (Signed)
Pt presents with c/o body aches , nausea and diarrhea for past 3 days also has right flank pain

## 2021-07-02 ENCOUNTER — Emergency Department
Admission: EM | Admit: 2021-07-02 | Discharge: 2021-07-02 | Disposition: A | Discharge status: Home / Self Care | Payer: Self-pay | Attending: Emergency Medicine | Admitting: Emergency Medicine

## 2021-07-02 ENCOUNTER — Encounter: Payer: Self-pay | Admitting: Emergency Medicine

## 2021-07-02 ENCOUNTER — Other Ambulatory Visit: Payer: Self-pay

## 2021-07-02 DIAGNOSIS — R5383 Other fatigue: Secondary | ICD-10-CM | POA: Insufficient documentation

## 2021-07-02 DIAGNOSIS — R110 Nausea: Secondary | ICD-10-CM | POA: Insufficient documentation

## 2021-07-02 DIAGNOSIS — Z5321 Procedure and treatment not carried out due to patient leaving prior to being seen by health care provider: Secondary | ICD-10-CM | POA: Insufficient documentation

## 2021-07-02 DIAGNOSIS — R1084 Generalized abdominal pain: Secondary | ICD-10-CM | POA: Insufficient documentation

## 2021-07-02 DIAGNOSIS — R42 Dizziness and giddiness: Secondary | ICD-10-CM | POA: Insufficient documentation

## 2021-07-02 LAB — BASIC METABOLIC PANEL
Anion gap: 11 (ref 5–15)
BUN: 25 mg/dL — ABNORMAL HIGH (ref 6–20)
CO2: 25 mmol/L (ref 22–32)
Calcium: 9.3 mg/dL (ref 8.9–10.3)
Chloride: 100 mmol/L (ref 98–111)
Creatinine, Ser: 1.16 mg/dL — ABNORMAL HIGH (ref 0.44–1.00)
GFR, Estimated: 56 mL/min — ABNORMAL LOW (ref >60)
Glucose, Bld: 146 mg/dL — ABNORMAL HIGH (ref 70–99)
Potassium: 3.6 mmol/L (ref 3.5–5.1)
Sodium: 136 mmol/L (ref 135–145)

## 2021-07-02 LAB — CBC
HCT: 36 % (ref 36.0–46.0)
Hemoglobin: 12.7 g/dL (ref 12.0–15.0)
MCH: 29.5 pg (ref 26.0–34.0)
MCHC: 35.3 g/dL (ref 30.0–36.0)
MCV: 83.7 fL (ref 80.0–100.0)
Platelets: 84 10*3/uL — ABNORMAL LOW (ref 150–400)
RBC: 4.3 MIL/uL (ref 3.87–5.11)
RDW: 13.7 % (ref 11.5–15.5)
WBC: 6.2 10*3/uL (ref 4.0–10.5)
nRBC: 0 % (ref 0.0–0.2)

## 2021-07-02 LAB — LIPASE, BLOOD: Lipase: 36 U/L (ref 11–51)

## 2021-07-02 NOTE — ED Notes (Signed)
No answer when called several times from lobby 

## 2021-07-02 NOTE — ED Triage Notes (Addendum)
Pt via POV from home. Pt c/o constant dizziness, fatigue, nausea, and generalized abd pain for the past 4 days. Pt was seen at [facility] and was prescribed prednisone with no relief. Denies any abd surgeries.Pt is A&Ox4 and NAD.

## 2021-07-02 NOTE — ED Notes (Signed)
No answer when called several times from lobby, no answer when phone # listed in chart called

## 2021-07-03 ENCOUNTER — Telehealth: Payer: Self-pay

## 2021-07-03 NOTE — Telephone Encounter (Signed)
Patient advised of no available appointments today. Will keep appt for tomorrow with Dr.  Rosanna Randy.

## 2021-07-03 NOTE — Telephone Encounter (Signed)
Copied from [place] [id number]. Topic: Appointment Scheduling - Scheduling Inquiry for Clinic >> Jul 03, 2021  9:37 AM Oneta Rack wrote: Any cancellations today please reach for a hospital / office visit. Please allow 30 minute travel time. Patient went to the ED  c/o constant dizziness, fatigue, nausea, and generalized abd pain for the past 4 days

## 2021-07-04 ENCOUNTER — Telehealth (INDEPENDENT_AMBULATORY_CARE_PROVIDER_SITE_OTHER): Payer: Self-pay | Admitting: Family Medicine

## 2021-07-04 ENCOUNTER — Other Ambulatory Visit: Payer: Self-pay

## 2021-07-04 DIAGNOSIS — R059 Cough, unspecified: Secondary | ICD-10-CM

## 2021-07-04 DIAGNOSIS — J069 Acute upper respiratory infection, unspecified: Secondary | ICD-10-CM

## 2021-07-04 MED ORDER — PREDNISONE 20 MG PO TABS: 20.0000 mg | ORAL_TABLET | Freq: Every day | ORAL | 1 refills | Status: DC

## 2021-07-04 MED ORDER — DOXYCYCLINE HYCLATE 100 MG PO TABS: 100.0000 mg | ORAL_TABLET | Freq: Two times a day (BID) | ORAL | 1 refills | Status: DC

## 2021-07-04 NOTE — Progress Notes (Signed)
Virtual telephone visit    Virtual Visit via Telephone Note   This visit type was conducted due to national recommendations for restrictions regarding the COVID-19 Pandemic (e.g. social distancing) in an effort to limit this patient's exposure and mitigate transmission in our community. Due to her co-morbid illnesses, this patient is at least at moderate risk for complications without adequate follow up. This format is felt to be most appropriate for this patient at this time. The patient did not have access to video technology or had technical difficulties with video requiring transitioning to audio format only (telephone). Physical exam was limited to content and character of the telephone converstion.    Patient location: Writer location: Office  I discussed the limitations of evaluation and management by telemedicine and the availability of in person appointments. The patient expressed understanding and agreed to proceed.  This is a phone call as the patient has significant COVID symptoms and she was unable to communicate or connect via the computer/ this is a telephone call Visit Date: 07/04/2021  Today's healthcare provider: Wilhemena Durie, MD   Chief Complaint  Patient presents with   Cough   Subjective    HPI  Patient comes in today for 1 week of lightheaded spells body aches headache and cough.  She is a smoker.  She did have a negative COVID test 3 days ago.  She went to the urgent care a week ago with the symptoms and declined a COVID test then.  She was treated appropriately with benzonatate and prednisone. Again she is a smoker and mentions a Z-Pak.  She went to the emergency room 2 days ago but it was an 8-hour wait so she left.       Medications: Outpatient Medications Prior to Visit  Medication Sig   benzonatate (TESSALON) 100 MG capsule Take 1 capsule by mouth every 8 (eight) hours for cough.   cephALEXin (KEFLEX) 500 MG capsule Take 1 capsule (500  mg total) by mouth 2 (two) times daily.   Cholecalciferol (VITAMIN D3) 2000 units TABS Take by mouth.   fluconazole (DIFLUCAN) 150 MG tablet Take one tablet by mouth as a single dose. May repeat in 3 days if symptoms persist.   lisinopril (ZESTRIL) 10 MG tablet Take 1 tablet (10 mg total) by mouth daily.   Omega-3 Fatty Acids (FISH OIL) 1000 MG CAPS Take by mouth daily.   omeprazole (PRILOSEC) 20 MG capsule TAKE 1 CAPSULE(20 MG) BY MOUTH DAILY   sertraline (ZOLOFT) 100 MG tablet Take 1 tablet (100 mg total) by mouth daily.   [DISCONTINUED] predniSONE (DELTASONE) 20 MG tablet Take 2 tablets (40 mg total) by mouth daily.   No facility-administered medications prior to visit.    Review of Systems     Objective    There were no vitals taken for this visit. BP Readings from Last 3 Encounters:  07/02/21 (!) 98/57  06/27/21 111/66  06/13/21 106/65   Wt Readings from Last 3 Encounters:  07/02/21 158 lb (71.7 kg)  04/23/21 154 lb (69.9 kg)  08/16/20 158 lb (71.7 kg)        Assessment & Plan     1. Viral upper respiratory tract infection I think this is likely COVID despite her negative home test.  I am sending her to get a COVID PCR at [facility].  Due to her long history of smoking we will treat with prednisone 20 mg daily 5 for 5 days and doxycycline 100 mg  twice a day for 5 days. Also have her hydrate and use Tylenol for the headache and Robitussin twice a day for the cough in addition to vitamin C vitamin D and zinc daily.  2. Cough She would benefit from stopping smoking.  She is advised of this.  Prednisone should help.  May need chest x-ray if the cough is persistent or if she worsens.  She denies any dyspnea.   No follow-ups on file.    I discussed the assessment and treatment plan with the patient. The patient was provided an opportunity to ask questions and all were answered. The patient agreed with the plan and demonstrated an understanding of the instructions.    The patient was advised to call back or seek an in-person evaluation if the symptoms worsen or if the condition fails to improve as anticipated.  I provided 12  minutes of non-face-to-face time during this encounter.  I, Wilhemena Durie, MD, have reviewed all documentation for this visit. The documentation on 07/04/21 for the exam, diagnosis, procedures, and orders are all accurate and complete.   Richard Cranford Mon, MD [facility] [phone] (phone) [phone] (fax)  [place]

## 2021-07-04 NOTE — Progress Notes (Deleted)
      Established patient visit .telepho  Patient: Cassandra Butler   DOB: [birth year]   55 y.o. Female  MRN: [id number] Visit Date: 07/04/2021  Today's healthcare provider: Wilhemena Durie, MD   Chief Complaint  Patient presents with   Cough   Subjective  -------------------------------------------------------------------------------------------------------------------- HPI  Follow up ER visit  Patient was seen in ER for URI and Flank pian on 06/27/2021. She was treated for URI and Flank pian . Treatment for this included see notes in chart. She reports good compliance with treatment. She reports this condition is Unchanged.   {Show patient history (optional):23778}   Medications: Outpatient Medications Prior to Visit  Medication Sig   benzonatate (TESSALON) 100 MG capsule Take 1 capsule by mouth every 8 (eight) hours for cough.   cephALEXin (KEFLEX) 500 MG capsule Take 1 capsule (500 mg total) by mouth 2 (two) times daily.   Cholecalciferol (VITAMIN D3) 2000 units TABS Take by mouth.   fluconazole (DIFLUCAN) 150 MG tablet Take one tablet by mouth as a single dose. May repeat in 3 days if symptoms persist.   lisinopril (ZESTRIL) 10 MG tablet Take 1 tablet (10 mg total) by mouth daily.   Omega-3 Fatty Acids (FISH OIL) 1000 MG CAPS Take by mouth daily.   omeprazole (PRILOSEC) 20 MG capsule TAKE 1 CAPSULE(20 MG) BY MOUTH DAILY   predniSONE (DELTASONE) 20 MG tablet Take 2 tablets (40 mg total) by mouth daily.   sertraline (ZOLOFT) 100 MG tablet Take 1 tablet (100 mg total) by mouth daily.   No facility-administered medications prior to visit.    Review of Systems  Constitutional:  Negative for appetite change, chills, fatigue and fever.  Respiratory:  Negative for chest tightness and shortness of breath.   Cardiovascular:  Negative for chest pain and palpitations.  Gastrointestinal:  Negative for abdominal pain, nausea and vomiting.  Neurological:  Negative for  dizziness and weakness.   {Labs  Heme  Chem  Endocrine  Serology  Results Review (optional):23779}   Objective  -------------------------------------------------------------------------------------------------------------------- There were no vitals taken for this visit. {Show previous vital signs (optional):23777}   Physical Exam  ***  No results found for any visits on 07/04/21.  Assessment & Plan  ---------------------------------------------------------------------------------------------------------------------- ***  No follow-ups on file.      {provider attestation***:1}   Wilhemena Durie, MD  [facility] [phone] (phone) [phone] (fax)  [place]

## 2021-07-28 ENCOUNTER — Other Ambulatory Visit: Payer: Self-pay | Admitting: Family Medicine

## 2021-07-28 DIAGNOSIS — F411 Generalized anxiety disorder: Secondary | ICD-10-CM

## 2021-07-31 ENCOUNTER — Other Ambulatory Visit: Payer: Self-pay | Admitting: Family Medicine

## 2021-07-31 DIAGNOSIS — I10 Essential (primary) hypertension: Secondary | ICD-10-CM

## 2021-10-11 ENCOUNTER — Other Ambulatory Visit: Payer: Self-pay

## 2021-10-11 ENCOUNTER — Ambulatory Visit
Admission: RE | Admit: 2021-10-11 | Discharge: 2021-10-11 | Disposition: A | Discharge status: Home / Self Care | Payer: Self-pay | Source: Ambulatory Visit | Attending: Urgent Care | Admitting: Urgent Care

## 2021-10-11 VITALS — BP 135/83 | HR 91 | Temp 98.4°F | Resp 18

## 2021-10-11 DIAGNOSIS — N3001 Acute cystitis with hematuria: Secondary | ICD-10-CM | POA: Insufficient documentation

## 2021-10-11 DIAGNOSIS — Z8616 Personal history of COVID-19: Secondary | ICD-10-CM | POA: Insufficient documentation

## 2021-10-11 DIAGNOSIS — M545 Low back pain, unspecified: Secondary | ICD-10-CM | POA: Insufficient documentation

## 2021-10-11 DIAGNOSIS — R0981 Nasal congestion: Secondary | ICD-10-CM | POA: Insufficient documentation

## 2021-10-11 DIAGNOSIS — R053 Chronic cough: Secondary | ICD-10-CM | POA: Insufficient documentation

## 2021-10-11 DIAGNOSIS — F17200 Nicotine dependence, unspecified, uncomplicated: Secondary | ICD-10-CM | POA: Insufficient documentation

## 2021-10-11 LAB — POCT URINALYSIS DIP (MANUAL ENTRY)
Bilirubin, UA: NEGATIVE
Glucose, UA: NEGATIVE mg/dL
Ketones, POC UA: NEGATIVE mg/dL
Nitrite, UA: NEGATIVE
Protein Ur, POC: 30 mg/dL — AB
Spec Grav, UA: 1.02 (ref 1.010–1.025)
Urobilinogen, UA: 0.2 E.U./dL
pH, UA: 7 (ref 5.0–8.0)

## 2021-10-11 MED ORDER — PREDNISONE 20 MG PO TABS: ORAL_TABLET | ORAL | 0 refills | Status: DC

## 2021-10-11 MED ORDER — FLUTICASONE PROPIONATE 50 MCG/ACT NA SUSP: 2.0000 | Freq: Every day | NASAL | 12 refills | Status: DC

## 2021-10-11 MED ORDER — CETIRIZINE HCL 10 MG PO TABS: 10.0000 mg | ORAL_TABLET | Freq: Every day | ORAL | 0 refills | Status: DC

## 2021-10-11 MED ORDER — MONTELUKAST SODIUM 10 MG PO TABS: 10.0000 mg | ORAL_TABLET | Freq: Every day | ORAL | 0 refills | Status: DC

## 2021-10-11 MED ORDER — CEPHALEXIN 500 MG PO CAPS: 500.0000 mg | ORAL_CAPSULE | Freq: Two times a day (BID) | ORAL | 0 refills | Status: DC

## 2021-10-11 MED ORDER — TIZANIDINE HCL 4 MG PO TABS: 4.0000 mg | ORAL_TABLET | Freq: Every day | ORAL | 0 refills | Status: DC

## 2021-10-11 MED ORDER — NAPROXEN 375 MG PO TABS: ORAL_TABLET | ORAL | 0 refills | Status: DC

## 2021-10-11 NOTE — ED Triage Notes (Signed)
Pt presents with c/o right lower back pain and dysuria, pt only has right kidney

## 2021-10-11 NOTE — ED Triage Notes (Signed)
Pt also reports cough since august post covid

## 2021-10-11 NOTE — Discharge Instructions (Addendum)
We will be using Keflex for a UTI. Make sure you hydrate very well, stop drinking sodas. We'll let you know about urine culture and if we need to change your antibiotic. For your back, hydrate, do at least 64 ounces of water daily. Use naproxen after you finish prednisone. It is ok for you to use tizanidine now though. That's a muscle relaxant. For your persistent we will stamp it out with prednisone for 5 days. Use cough medications as needed. Start taking Zyrtec daily now with Singulair. Start Flonase nasal spray after you finish the prednisone.

## 2021-10-11 NOTE — ED Provider Notes (Addendum)
[place]   MRN: [id number] DOB: [birth year]  Subjective:   Cassandra Butler is a 55 y.o. female presenting for 2 week history of persistent right low back pain, intermittent dysuria that started yesterday. Had a slight tinge of blood. Has 1 kidney. Lost function in her kidney from a previous surgery for her hysterectomy.  Admits that she does not hydrate with water at all.  Drinks mostly diet [employer].  She also does a lot of strenuous lifting at work and thought that this may be what is happening with her back pain but wanted to make sure she got checked for kidney infection.  Reports that she has been using ibuprofen intermittently for her back pain with temporary relief.  No particular fall or trauma.  At discharge, patient wanted to be evaluated for persistent sinus congestion. Had COVID 19 in August. Since then has had persistent sinus congestion, coughing. No chest pain, wheezing. Has intermittent shob which is not new. She is a smoker, smokes 1ppd. Does not take any antihistamine. Has never needed an inhaler.   No current facility-administered medications for this encounter.  Current Outpatient Medications:    benzonatate (TESSALON) 100 MG capsule, Take 1 capsule by mouth every 8 (eight) hours for cough., Disp: 21 capsule, Rfl: 0   cephALEXin (KEFLEX) 500 MG capsule, Take 1 capsule (500 mg total) by mouth 2 (two) times daily., Disp: 10 capsule, Rfl: 0   Cholecalciferol (VITAMIN D3) 2000 units TABS, Take by mouth., Disp: , Rfl:    doxycycline (VIBRA-TABS) 100 MG tablet, Take 1 tablet (100 mg total) by mouth 2 (two) times daily., Disp: 10 tablet, Rfl: 1   fluconazole (DIFLUCAN) 150 MG tablet, Take one tablet by mouth as a single dose. May repeat in 3 days if symptoms persist., Disp: 2 tablet, Rfl: 0   lisinopril (ZESTRIL) 10 MG tablet, TAKE 1 TABLET(10 MG) BY MOUTH DAILY, Disp: 90 tablet, Rfl: 0   Omega-3 Fatty Acids (FISH OIL) 1000 MG CAPS, Take by mouth daily.,  Disp: , Rfl:    omeprazole (PRILOSEC) 20 MG capsule, TAKE 1 CAPSULE(20 MG) BY MOUTH DAILY, Disp: 90 capsule, Rfl: 3   predniSONE (DELTASONE) 20 MG tablet, Take 1 tablet (20 mg total) by mouth daily., Disp: 5 tablet, Rfl: 1   sertraline (ZOLOFT) 100 MG tablet, TAKE 1 TABLET(100 MG) BY MOUTH DAILY, Disp: 90 tablet, Rfl: 0   No Known Allergies  Past Medical History:  Diagnosis Date   Cancer ([place]) 11/14/2007   Left breast wide excision, sentinel node biopsy. 1.4 cm, T1c, N0: ER 90%; PR 905; Her 2 neu not over expressed.    Diabetes mellitus without complication ([place])    GERD (gastroesophageal reflux disease)    Hyperlipidemia    Hypertension    Vitamin D deficiency      Past Surgical History:  Procedure Laterality Date   ABDOMINAL HYSTERECTOMY  2004   total, due to fibriods. Also had ovaries removed after 1 year of breast cancer.    BREAST LUMPECTOMY Left 2010   COLONOSCOPY     25 yrs ago   COLONOSCOPY  04/16/2010   Dr Candace Cruise    Family History  Problem Relation Age of Onset   Diabetes Mother     Social History   Tobacco Use   Smoking status: Every Day    Packs/day: 1.00    Types: Cigarettes   Smokeless tobacco: Never  Substance Use Topics   Alcohol use: No   Drug use: No  ROS   Objective:   Vitals: BP 135/83   Pulse 91   Temp 98.4 F (36.9 C)   Resp 18   SpO2 95%   Physical Exam Constitutional:      General: She is not in acute distress.    Appearance: Normal appearance. She is well-developed. She is not ill-appearing, toxic-appearing or diaphoretic.  HENT:     Head: Normocephalic and atraumatic.     Nose: Nose normal.     Mouth/Throat:     Mouth: Mucous membranes are moist.  Eyes:     General: No scleral icterus.       Right eye: No discharge.        Left eye: No discharge.     Extraocular Movements: Extraocular movements intact.     Conjunctiva/sclera: Conjunctivae normal.     Pupils: Pupils are equal, round, and reactive to light.   Cardiovascular:     Rate and Rhythm: Normal rate.  Pulmonary:     Effort: Pulmonary effort is normal.  Abdominal:     General: Bowel sounds are normal. There is no distension.     Palpations: Abdomen is soft. There is no mass.     Tenderness: There is no abdominal tenderness. There is no right CVA tenderness, left CVA tenderness, guarding or rebound.  Skin:    General: Skin is warm and dry.  Neurological:     General: No focal deficit present.     Mental Status: She is alert and oriented to person, place, and time.  Psychiatric:        Mood and Affect: Mood normal.        Behavior: Behavior normal.        Thought Content: Thought content normal.        Judgment: Judgment normal.    Results for orders placed or performed during the hospital encounter of 10/11/21 (from the past 24 hour(s))  POCT urinalysis dipstick     Status: Abnormal   Collection Time: 10/11/21 10:16 AM  Result Value Ref Range   Color, UA yellow yellow   Clarity, UA cloudy (A) clear   Glucose, UA negative negative mg/dL   Bilirubin, UA negative negative   Ketones, POC UA negative negative mg/dL   Spec Grav, UA 1.020 1.010 - 1.025   Blood, UA moderate (A) negative   pH, UA 7.0 5.0 - 8.0   Protein Ur, POC =30 (A) negative mg/dL   Urobilinogen, UA 0.2 0.2 or 1.0 E.U./dL   Nitrite, UA Negative Negative   Leukocytes, UA Small (1+) (A) Negative    Assessment and Plan :   PDMP not reviewed this encounter.  1. Acute cystitis with hematuria   2. Acute right-sided low back pain without sciatica    Start Keflex to cover for acute cystitis, urine culture pending.  Recommended aggressive hydration, limiting urinary irritants.  Suspect musculoskeletal back pain and therefore recommend tizanidine and naproxen.  Counseled patient on potential for adverse effects with medications prescribed/recommended today, ER and return-to-clinic precautions discussed, patient verbalized understanding.     Jaynee Eagles,  PA-C 10/11/21 1029    Physical exam: Clear cardiopulmonary exam.  No adventitious lung sounds.  Regular rate and rhythm.  No abnormal heart sounds.  No wheezing, rhonchi, rales.  Patient did have significant postnasal drainage overlying the pharynx.  Nasal mucosa boggy and edematous, congestion in nasal passages.   A&P: Recommended an oral prednisone course given the timeline of her symptoms and in the setting of her smoking.  Will defer COVID-19 testing as she had this 3 months ago.  Recommended starting daily regimen of Zyrtec, Singulair and then Flonase after she is done with prednisone. Counseled patient on potential for adverse effects with medications prescribed/recommended today, ER and return-to-clinic precautions discussed, patient verbalized understanding.    Jaynee Eagles, PA-C 10/11/21 1104

## 2021-10-12 LAB — URINE CULTURE

## 2021-11-02 ENCOUNTER — Other Ambulatory Visit: Payer: Self-pay | Admitting: Family Medicine

## 2021-11-02 DIAGNOSIS — F411 Generalized anxiety disorder: Secondary | ICD-10-CM

## 2021-11-02 DIAGNOSIS — I10 Essential (primary) hypertension: Secondary | ICD-10-CM

## 2021-11-02 NOTE — Telephone Encounter (Signed)
Requested Prescriptions  Pending Prescriptions Disp Refills   lisinopril (ZESTRIL) 10 MG tablet [Pharmacy Med Name: LISINOPRIL 10MG  TABLETS] 90 tablet 0    Sig: TAKE 1 TABLET(10 MG) BY MOUTH DAILY     Cardiovascular:  ACE Inhibitors Failed - 11/02/2021  3:32 AM      Failed - Cr in normal range and within 180 days    Creatinine  Date Value Ref Range Status  02/17/2015 0.88 mg/dL Final    Comment:    0.44-1.00 NOTE: New Reference Range  01/17/15    Creatinine, Ser  Date Value Ref Range Status  07/02/2021 1.16 (H) 0.44 - 1.00 mg/dL Final         Passed - K in normal range and within 180 days    Potassium  Date Value Ref Range Status  07/02/2021 3.6 3.5 - 5.1 mmol/L Final  02/17/2015 4.1 mmol/L Final    Comment:    3.5-5.1 NOTE: New Reference Range  01/17/15          Passed - Patient is not pregnant      Passed - Last BP in normal range    BP Readings from Last 1 Encounters:  10/11/21 135/83         Passed - Valid encounter within last 6 months    Recent Outpatient Visits          4 months ago Viral upper respiratory tract infection   [facility] Jerrol Banana., MD   6 months ago Essential (primary) hypertension   [facility], Vickki Muff, PA-C   1 year ago [place], [place], [place]   1 year ago Type 2 diabetes mellitus with hyperglycemia, without long-term current use of insulin [facility])   [facility] [place], [place], [place]   1 year ago Type 2 diabetes mellitus with hyperglycemia, without long-term current use of insulin [facility])   [facility] [place], [place], [place]

## 2021-11-02 NOTE — Telephone Encounter (Signed)
Requested Prescriptions  Pending Prescriptions Disp Refills   sertraline (ZOLOFT) 100 MG tablet [Pharmacy Med Name: SERTRALINE 100MG  TABLETS] 90 tablet 0    Sig: TAKE 1 TABLET(100 MG) BY MOUTH DAILY     Psychiatry:  Antidepressants - SSRI Passed - 11/02/2021  3:32 AM      Passed - Valid encounter within last 6 months    Recent Outpatient Visits          4 months ago Viral upper respiratory tract infection   [facility] Jerrol Banana., MD   6 months ago Essential (primary) hypertension   [facility], Vickki Muff, PA-C   1 year ago [place], [place], [place]   1 year ago Type 2 diabetes mellitus with hyperglycemia, without long-term current use of insulin [facility])   [facility] [place], [place], [place]   1 year ago Type 2 diabetes mellitus with hyperglycemia, without long-term current use of insulin [facility])   [place], [place], [place]

## 2022-01-28 ENCOUNTER — Other Ambulatory Visit: Payer: Self-pay | Admitting: Family Medicine

## 2022-01-28 DIAGNOSIS — I10 Essential (primary) hypertension: Secondary | ICD-10-CM

## 2022-01-28 DIAGNOSIS — F411 Generalized anxiety disorder: Secondary | ICD-10-CM

## 2022-02-04 ENCOUNTER — Telehealth: Payer: Self-pay | Admitting: Family Medicine

## 2022-02-04 ENCOUNTER — Ambulatory Visit: Payer: Self-pay

## 2022-02-04 DIAGNOSIS — F411 Generalized anxiety disorder: Secondary | ICD-10-CM

## 2022-02-04 DIAGNOSIS — I10 Essential (primary) hypertension: Secondary | ICD-10-CM

## 2022-02-04 NOTE — Telephone Encounter (Addendum)
Pt called saying the pharmacy said they have not seen the refill for the Lisinopril or the Sertraline.  It looks like the office sent it on the 6th, ?

## 2022-02-04 NOTE — Telephone Encounter (Signed)
Patient advised medications have been [facility] and [facility] with [facility].  ?

## 2022-02-04 NOTE — Telephone Encounter (Signed)
[facility] calling to verify how pt. Takes Zoloft and Lisinopril. In chart under directions it states "pt. Needs office visit." Pharmacy given directions from previous office note. ? ? ?Answer Assessment - Initial Assessment Questions ?1. DRUG NAME: "What medicine do you need to have refilled?" ?    Zoloft and Lisinopril ?2. REFILLS REMAINING: "How many refills are remaining?" (Note: The label on the medicine or pill bottle will show how many refills are remaining. If there are no refills remaining, then a renewal may be needed.) ?    N/a ?3. EXPIRATION DATE: "What is the expiration date?" (Note: The label states when the prescription will expire, and thus can no longer be refilled.) ?    N/a ?4. PRESCRIBING HCP: "Who prescribed it?" Reason: If prescribed by specialist, call should be referred to that group. ?    Dr. Brita Romp ?5. SYMPTOMS: "Do you have any symptoms?" ?    N/a ?6. PREGNANCY: "Is there any chance that you are pregnant?" "When was your last menstrual period?" ?    N/a ? ?Protocols used: Medication Refill and Renewal Call-A-AH ? ?

## 2022-02-04 NOTE — Telephone Encounter (Signed)
Janett Billow is calling From [facility] is wanting clairication is the sertraline (ZOLOFT) 100 MG & tablet [[id number]] & lisinopril (ZESTRIL) 10 MG tablet [[id number]]  being auth or is the sig needing clarfication  ?[phone]  ?

## 2022-02-13 NOTE — Progress Notes (Signed)
?  ? ?I,Sulibeya S Dimas,acting as a scribe for Lavon Paganini, MD.,have documented all relevant documentation on the behalf of Lavon Paganini, MD,as directed by  Lavon Paganini, MD while in the presence of Lavon Paganini, MD. ? ? ?Established patient visit ? ? ?Patient: Cassandra Butler   DOB: [birth year]   56 y.o. Female  MRN: [id number] ?Visit Date: 02/14/2022 ? ?Today's healthcare provider: Lavon Paganini, MD  ? ?Chief Complaint  ?Patient presents with  ? Back Pain  ? ?Subjective  ?  ?Back Pain ?This is a new problem. The current episode started more than 1 month ago. The problem occurs constantly. The problem is unchanged. The pain is present in the lumbar spine (right hip). The quality of the pain is described as aching. The pain does not radiate. The pain is mild. The pain is The same all the time. The symptoms are aggravated by position. Stiffness is present All day. Associated symptoms include paresis (leg) and weakness. Pertinent negatives include no abdominal pain, bladder incontinence, bowel incontinence, chest pain, dysuria, fever, leg pain, numbness, paresthesias or tingling. She has tried muscle relaxant for the symptoms. The treatment provided no relief.  ?Legs feel like 500 lb sandbags - able to lift them. ?No numbness in legs ?No pain ? ?Patient C/O hand pain x 2 months. Patient reports swelling, stiffness and cant make a fist.   Trigger fingers of both index fingers ? ?Reports that she is diabetic and now off of all diabetic meds.  Used Dexcom CGM and got her A1c down. ? ? ?Medications: ?Outpatient Medications Prior to Visit  ?Medication Sig  ? cetirizine (ZYRTEC ALLERGY) 10 MG tablet Take 1 tablet (10 mg total) by mouth daily.  ? lisinopril (ZESTRIL) 10 MG tablet Please schedule office visit before any future refill.  ? omeprazole (PRILOSEC) 20 MG capsule TAKE 1 CAPSULE(20 MG) BY MOUTH DAILY  ? sertraline (ZOLOFT) 100 MG tablet Please schedule office visit before any future refill.   ? [DISCONTINUED] benzonatate (TESSALON) 100 MG capsule Take 1 capsule by mouth every 8 (eight) hours for cough. (Patient not taking: Reported on 02/14/2022)  ? [DISCONTINUED] cephALEXin (KEFLEX) 500 MG capsule Take 1 capsule (500 mg total) by mouth 2 (two) times daily. (Patient not taking: Reported on 02/14/2022)  ? [DISCONTINUED] Cholecalciferol (VITAMIN D3) 2000 units TABS Take by mouth. (Patient not taking: Reported on 02/14/2022)  ? [DISCONTINUED] doxycycline (VIBRA-TABS) 100 MG tablet Take 1 tablet (100 mg total) by mouth 2 (two) times daily. (Patient not taking: Reported on 02/14/2022)  ? [DISCONTINUED] fluconazole (DIFLUCAN) 150 MG tablet Take one tablet by mouth as a single dose. May repeat in 3 days if symptoms persist. (Patient not taking: Reported on 02/14/2022)  ? [DISCONTINUED] fluticasone (FLONASE) 50 MCG/ACT nasal spray Place 2 sprays into both nostrils daily. (Patient not taking: Reported on 02/14/2022)  ? [DISCONTINUED] montelukast (SINGULAIR) 10 MG tablet Take 1 tablet (10 mg total) by mouth at bedtime. (Patient not taking: Reported on 02/14/2022)  ? [DISCONTINUED] naproxen (NAPROSYN) 375 MG tablet Take twice daily as needed with meals for aches and pains. (Patient not taking: Reported on 02/14/2022)  ? [DISCONTINUED] Omega-3 Fatty Acids (FISH OIL) 1000 MG CAPS Take by mouth daily. (Patient not taking: Reported on 02/14/2022)  ? [DISCONTINUED] predniSONE (DELTASONE) 20 MG tablet Take 2 tablets daily with breakfast. (Patient not taking: Reported on 02/14/2022)  ? [DISCONTINUED] tiZANidine (ZANAFLEX) 4 MG tablet Take 1 tablet (4 mg total) by mouth at bedtime. (Patient not taking: Reported on 02/14/2022)  ? ?  No facility-administered medications prior to visit.  ? ? ?Review of Systems  ?Constitutional:  Negative for chills and fever.  ?Respiratory:  Negative for chest tightness.   ?Cardiovascular:  Negative for chest pain.  ?Gastrointestinal:  Negative for abdominal pain and bowel incontinence.  ?Genitourinary:  Negative  for bladder incontinence and dysuria.  ?Musculoskeletal:  Positive for arthralgias, back pain and myalgias.  ?Neurological:  Positive for weakness. Negative for tingling, numbness and paresthesias.  ? ? ?  Objective  ?  ?BP 98/66 (BP Location: Left Arm, Patient Position: Sitting, Cuff Size: Large)   Pulse (!) 101   Temp 98.3 ?F (36.8 ?C) (Oral)   Resp 16   Wt 169 lb 4.8 oz (76.8 kg)   BMI 27.33 kg/m?  ? ? ?Physical Exam ?Vitals reviewed.  ?Constitutional:   ?   General: She is not in acute distress. ?   Appearance: Normal appearance. She is well-developed. She is not diaphoretic.  ?HENT:  ?   Head: Normocephalic and atraumatic.  ?Eyes:  ?   General: No scleral icterus. ?   Conjunctiva/sclera: Conjunctivae normal.  ?Neck:  ?   Thyroid: No thyromegaly.  ?Cardiovascular:  ?   Rate and Rhythm: Normal rate and regular rhythm.  ?   Pulses: Normal pulses.  ?   Heart sounds: Normal heart sounds. No murmur heard. ?Pulmonary:  ?   Effort: Pulmonary effort is normal. No respiratory distress.  ?   Breath sounds: Normal breath sounds. No wheezing, rhonchi or rales.  ?Musculoskeletal:  ?   Cervical back: Neck supple.  ?   Right lower leg: No edema.  ?   Left lower leg: No edema.  ?   Comments: Bilateral index trigger fingers ?Right hip: Tenderness to palpation over right greater trochanteric bursa, normal range of motion with no pain with internal or external rotation ?Back: Tenderness to palpation only over L5 spinous process/top of sacrum ?Normal range of motion ?Negative straight leg raise bilaterally ?Sensation and strength intact in lower extremities  ?Lymphadenopathy:  ?   Cervical: No cervical adenopathy.  ?Skin: ?   General: Skin is warm and dry.  ?   Findings: No rash.  ?Neurological:  ?   Mental Status: She is alert and oriented to person, place, and time. Mental status is at baseline.  ?Psychiatric:     ?   Mood and Affect: Mood normal.     ?   Behavior: Behavior normal.  ?  ? ? ?No results found for any visits on  02/14/22. ? Assessment & Plan  ?  ? ?1. Trigger index finger of right hand ?2. Trigger index finger of left hand ?-New problem ?- Trigger fingers of both index fingers ?- She is having pain with this and it does affect her work and home life ?- Referral to hand surgery for further evaluation and management and possible injection ?- Ambulatory referral to Hand Surgery ? ?3. Trochanteric bursitis of right hip ?-New problem ?- Discussed conservative measures including home exercise program, rest, ice, NSAIDs or Tylenol as needed ?- No signs of hip joint pathology ?- Return precautions discussed ? ?4. Acute midline low back pain without sciatica ?- New problem ?- Likely consistent with low back strain, however she does have some midline tenderness to palpation over the top of her sacrum/L5 vertebra, so will obtain imaging today ?- She is endorsing some heaviness of her legs, but her pulses are intact and strength and sensation are intact on exam with no signs of  radiculopathy ?- Home exercise program given ?- Trial of gabapentin 300 mg nightly ?- Consider formal physical therapy ?- Discussed red flags/return precautions ?- DG Lumbar Spine Complete; Future  ? ? ?Meds ordered this encounter  ?Medications  ? gabapentin (NEURONTIN) 300 MG capsule  ?  Sig: Take 1 capsule (300 mg total) by mouth at bedtime.  ?  Dispense:  30 capsule  ?  Refill:  3  ?  ? ?Return in about 2 weeks (around 02/28/2022) for chronic disease f/u.  ?   ? ?I, Lavon Paganini, MD, have reviewed all documentation for this visit. The documentation on 02/14/22 for the exam, diagnosis, procedures, and orders are all accurate and complete. ? ? ?Virginia Crews, MD, MPH ?[place] ?[facility]   ?

## 2022-02-14 ENCOUNTER — Ambulatory Visit (INDEPENDENT_AMBULATORY_CARE_PROVIDER_SITE_OTHER): Payer: BC Managed Care – PPO | Admitting: Family Medicine

## 2022-02-14 ENCOUNTER — Ambulatory Visit
Admission: RE | Admit: 2022-02-14 | Discharge: 2022-02-14 | Disposition: A | Discharge status: Home / Self Care | Payer: BC Managed Care – PPO | Attending: Family Medicine | Admitting: Family Medicine

## 2022-02-14 ENCOUNTER — Ambulatory Visit
Admission: RE | Admit: 2022-02-14 | Discharge: 2022-02-14 | Disposition: A | Discharge status: Home / Self Care | Payer: BC Managed Care – PPO | Source: Ambulatory Visit | Attending: Family Medicine | Admitting: Family Medicine

## 2022-02-14 ENCOUNTER — Encounter: Payer: Self-pay | Admitting: Family Medicine

## 2022-02-14 VITALS — BP 98/66 | HR 101 | Temp 98.3°F | Resp 16 | Wt 169.3 lb

## 2022-02-14 DIAGNOSIS — M545 Low back pain, unspecified: Secondary | ICD-10-CM

## 2022-02-14 DIAGNOSIS — M7061 Trochanteric bursitis, right hip: Secondary | ICD-10-CM

## 2022-02-14 DIAGNOSIS — M65322 Trigger finger, left index finger: Secondary | ICD-10-CM | POA: Diagnosis not present

## 2022-02-14 DIAGNOSIS — M65321 Trigger finger, right index finger: Secondary | ICD-10-CM

## 2022-02-14 MED ORDER — GABAPENTIN 300 MG PO CAPS: 300.0000 mg | ORAL_CAPSULE | Freq: Every day | ORAL | 3 refills | Status: DC

## 2022-02-18 ENCOUNTER — Telehealth: Payer: Self-pay

## 2022-02-18 NOTE — Telephone Encounter (Signed)
PA started 02/18/22 ?

## 2022-02-18 NOTE — Telephone Encounter (Signed)
Copied from [place] [phone]. Topic: Quick Communication - Rx Refill/Question ?>> Feb 18, 2022  1:21 PM Pawlus, Brayton Layman A wrote: ?Pt was calling to follow up on a PA required for gabapentin (NEURONTIN) 300 MG capsule, please advise. ?

## 2022-02-19 DIAGNOSIS — R2233 Localized swelling, mass and lump, upper limb, bilateral: Secondary | ICD-10-CM | POA: Diagnosis not present

## 2022-02-20 NOTE — Telephone Encounter (Signed)
Patient advised.

## 2022-02-20 NOTE — Telephone Encounter (Signed)
PA approved for gabapentin. [facility] advised of approval.  ?

## 2022-03-19 ENCOUNTER — Ambulatory Visit (INDEPENDENT_AMBULATORY_CARE_PROVIDER_SITE_OTHER): Payer: BC Managed Care – PPO | Admitting: Family Medicine

## 2022-03-19 ENCOUNTER — Encounter: Payer: Self-pay | Admitting: Family Medicine

## 2022-03-19 VITALS — BP 99/63 | HR 94 | Temp 98.6°F | Resp 16 | Wt 169.0 lb

## 2022-03-19 DIAGNOSIS — Z1159 Encounter for screening for other viral diseases: Secondary | ICD-10-CM

## 2022-03-19 DIAGNOSIS — I152 Hypertension secondary to endocrine disorders: Secondary | ICD-10-CM | POA: Diagnosis not present

## 2022-03-19 DIAGNOSIS — E1169 Type 2 diabetes mellitus with other specified complication: Secondary | ICD-10-CM

## 2022-03-19 DIAGNOSIS — E1165 Type 2 diabetes mellitus with hyperglycemia: Secondary | ICD-10-CM | POA: Diagnosis not present

## 2022-03-19 DIAGNOSIS — E1159 Type 2 diabetes mellitus with other circulatory complications: Secondary | ICD-10-CM

## 2022-03-19 DIAGNOSIS — I739 Peripheral vascular disease, unspecified: Secondary | ICD-10-CM

## 2022-03-19 DIAGNOSIS — Z720 Tobacco use: Secondary | ICD-10-CM

## 2022-03-19 DIAGNOSIS — Z853 Personal history of malignant neoplasm of breast: Secondary | ICD-10-CM

## 2022-03-19 DIAGNOSIS — E785 Hyperlipidemia, unspecified: Secondary | ICD-10-CM | POA: Diagnosis not present

## 2022-03-19 DIAGNOSIS — F411 Generalized anxiety disorder: Secondary | ICD-10-CM

## 2022-03-19 LAB — POCT GLYCOSYLATED HEMOGLOBIN (HGB A1C)
Est. average glucose Bld gHb Est-mCnc: 272
Hemoglobin A1C: 11.1 % — AB (ref 4.0–5.6)

## 2022-03-19 MED ORDER — EMPAGLIFLOZIN 25 MG PO TABS: 25.0000 mg | ORAL_TABLET | Freq: Every day | ORAL | 3 refills | Status: DC

## 2022-03-19 NOTE — Assessment & Plan Note (Signed)
Chronic and uncontrolled ?Failed metformin in the past due to GI upset ?Will try Jardiance '25mg'$  daily ?Discussed diet and exercise ?She is hoping to get back on CGM which helped improve her blood glucoses previously ?Foot exam today ?Advised to schedule eye exam ?UACR today ?Continue ACE inhibitor ?Not on statin-see discussion below ?Follow-up in 3 months repeat A1c ?

## 2022-03-19 NOTE — Assessment & Plan Note (Signed)
Reviewed last lipid panel Not currently on a statin Recheck FLP and CMP Discussed diet and exercise  

## 2022-03-19 NOTE — Assessment & Plan Note (Signed)
When I saw patient 1 month ago, she was reporting that her legs feel heavy when she is walking, but denied any pain ?She has not had any improvement with gabapentin and she does report decreased hair growth of her legs today, so I am suspicious for claudication/PAD ?ABIs bilaterally ?Pending results, consider [facility] referral ?May need statin as above ?Encouraged tobacco cessation as above ?

## 2022-03-19 NOTE — Progress Notes (Signed)
?  ? ?I,Sulibeya S Dimas,acting as a scribe for Lavon Paganini, MD.,have documented all relevant documentation on the behalf of Lavon Paganini, MD,as directed by  Lavon Paganini, MD while in the presence of Lavon Paganini, MD. ? ? ?Established patient visit ? ? ?Patient: Cassandra Butler   DOB: 1966/09/06   56 y.o. Female  MRN: [id number] ?Visit Date: 03/19/2022 ? ?Today's healthcare provider: Lavon Paganini, MD  ? ?Chief Complaint  ?Patient presents with  ? Anxiety  ? Diabetes  ? ?Subjective  ?  ?HPI  ?Anxiety, Follow-up ? ?She was last seen for anxiety 1 years ago. ?Changes made at last visit include no changes. ?  ?She reports excellent compliance with treatment. ?She reports excellent tolerance of treatment. ?She is not having side effects.  ? ?She feels her anxiety is mild and Improved since last visit. ? ?Symptoms: ?Yes chest pain No difficulty concentrating  ?No dizziness Yes fatigue  ?No feelings of losing control No insomnia  ?No irritable No palpitations  ?No panic attacks No racing thoughts  ?No shortness of breath No sweating  ?No tremors/shakes   ? ?GAD-7 Results ? ?  04/23/2021  ?  4:00 PM  ?GAD-7 Generalized Anxiety Disorder Screening Tool  ?1. Feeling Nervous, Anxious, or on Edge 0  ?2. Not Being Able to Stop or Control Worrying 0  ?3. Worrying Too Much About Different Things 0  ?4. Trouble Relaxing 0  ?5. Being So Restless it's Hard To Sit Still 0  ?6. Becoming Easily Annoyed or Irritable 0  ?7. Feeling Afraid As If Something Awful Might Happen 0  ?Total GAD-7 Score 0  ?Difficulty At Work, Home, or Getting  Along With Others? Not difficult at all  ? ? ?PHQ-9 Scores ? ?  03/19/2022  ? 11:24 AM 02/14/2022  ?  3:46 PM 02/14/2022  ?  3:45 PM  ?PHQ9 SCORE ONLY  ?PHQ-9 Total Score '2 6 6  '$ ? ? ?--------------------------------------------------------------------------------------------------- ?Diabetes Mellitus Type II, Follow-up ? ?Lab Results  ?Component Value Date  ? HGBA1C 11.1 (A) 03/19/2022  ?  HGBA1C 5.9 (A) 04/23/2021  ? HGBA1C 7.9 (A) 07/19/2020  ? ?Wt Readings from Last 3 Encounters:  ?03/19/22 169 lb (76.7 kg)  ?02/14/22 169 lb 4.8 oz (76.8 kg)  ?07/02/21 158 lb (71.7 kg)  ? ?Last seen for diabetes 1 years ago.  ?Management since then includes no changes. ?She reports poor compliance with treatment. Patient reports she stopped using Dexcom in November.  Hoping to get G7 CGM to help her with lifestyle changes in the next month. ? ?Symptoms: ?Yes fatigue No foot ulcerations  ?No appetite changes No nausea  ?No paresthesia of the feet  No polydipsia  ?No polyuria No visual disturbances   ?No vomiting   ? ? ?Home blood sugar records:  not being checked ? ?Episodes of hypoglycemia? No  ?  ?Current insulin regiment: none ?Most Recent Eye Exam: UTD ?Current exercise: no regular exercise ?Current diet habits: in general, an "unhealthy" diet ? ?Pertinent Labs: ?Lab Results  ?Component Value Date  ? CHOL 140 07/28/2019  ? HDL 36 12/31/2019  ? [place] 67 12/31/2019  ? LDLDIRECT 37 07/28/2019  ? TRIG 495 (H) 07/28/2019  ? CHOLHDL 5.4 (H) 07/28/2019  ? Lab Results  ?Component Value Date  ? NA 136 07/02/2021  ? K 3.6 07/02/2021  ? CREATININE 1.16 (H) 07/02/2021  ? GFRNONAA 56 (L) 07/02/2021  ? MICROALBUR 50 04/23/2021  ?  ? ?--------------------------------------------------------------------------------------------------- ? ? ? ? ?Medications: ?Outpatient  Medications Prior to Visit  ?Medication Sig  ? cetirizine (ZYRTEC ALLERGY) 10 MG tablet Take 1 tablet (10 mg total) by mouth daily.  ? lisinopril (ZESTRIL) 10 MG tablet Please schedule office visit before any future refill.  ? omeprazole (PRILOSEC) 20 MG capsule TAKE 1 CAPSULE(20 MG) BY MOUTH DAILY  ? sertraline (ZOLOFT) 100 MG tablet Please schedule office visit before any future refill.  ? [DISCONTINUED] gabapentin (NEURONTIN) 300 MG capsule Take 1 capsule (300 mg total) by mouth at bedtime.  ? ?No facility-administered medications prior to visit.  ? ? ?Review  of Systems per HPI ? ?  ?  Objective  ?  ?BP 99/63 (BP Location: Left Arm, Patient Position: Sitting, Cuff Size: Normal)   Pulse 94   Temp 98.6 ?F (37 ?C) (Oral)   Resp 16   Wt 169 lb (76.7 kg)   BMI 27.28 kg/m?  ?BP Readings from Last 3 Encounters:  ?03/19/22 99/63  ?02/14/22 98/66  ?10/11/21 135/83  ? ?Wt Readings from Last 3 Encounters:  ?03/19/22 169 lb (76.7 kg)  ?02/14/22 169 lb 4.8 oz (76.8 kg)  ?07/02/21 158 lb (71.7 kg)  ? ?  ? ?Physical Exam ?Vitals reviewed.  ?Constitutional:   ?   General: She is not in acute distress. ?   Appearance: Normal appearance. She is well-developed. She is not diaphoretic.  ?HENT:  ?   Head: Normocephalic and atraumatic.  ?Eyes:  ?   General: No scleral icterus. ?   Conjunctiva/sclera: Conjunctivae normal.  ?Neck:  ?   Thyroid: No thyromegaly.  ?Cardiovascular:  ?   Rate and Rhythm: Normal rate and regular rhythm.  ?   Pulses: Normal pulses.  ?   Heart sounds: Normal heart sounds. No murmur heard. ?Pulmonary:  ?   Effort: Pulmonary effort is normal. No respiratory distress.  ?   Breath sounds: Normal breath sounds. No wheezing, rhonchi or rales.  ?Musculoskeletal:  ?   Cervical back: Neck supple.  ?   Right lower leg: No edema.  ?   Left lower leg: No edema.  ?Lymphadenopathy:  ?   Cervical: No cervical adenopathy.  ?Skin: ?   General: Skin is warm and dry.  ?Neurological:  ?   Mental Status: She is alert and oriented to person, place, and time. Mental status is at baseline.  ?Psychiatric:     ?   Mood and Affect: Mood normal.     ?   Behavior: Behavior normal.  ?  ?Diabetic Foot Exam - Simple   ?Simple Foot Form ?Diabetic Foot exam was performed with the following findings: Yes 03/19/2022 11:38 AM  ?Visual Inspection ?No deformities, no ulcerations, no other skin breakdown bilaterally: Yes ?Sensation Testing ?Intact to touch and monofilament testing bilaterally: Yes ?Pulse Check ?Posterior Tibialis and Dorsalis pulse intact bilaterally: Yes ?Comments ?  ?  ? ?Results for  orders placed or performed in visit on 03/19/22  ?POCT HgB A1C  ?Result Value Ref Range  ? Hemoglobin A1C 11.1 (A) 4.0 - 5.6 %  ? Est. average glucose Bld gHb Est-mCnc 272   ? ? Assessment & Plan  ?  ? ?Problem List Items Addressed This Visit   ? ?  ? Cardiovascular and Mediastinum  ? Hypertension associated with diabetes ([place])  ?  Well controlled ?Continue current medications ?Recheck metabolic panel ?F/u in 6 months  ?  ?  ? Relevant Medications  ? empagliflozin (JARDIANCE) 25 MG TABS tablet  ? Other Relevant Orders  ? Comprehensive metabolic  panel  ?  ? Endocrine  ? Hyperlipidemia associated with type 2 diabetes mellitus ([place])  ?  Reviewed last lipid panel ?Not currently on a statin ?Recheck FLP and CMP ?Discussed diet and exercise  ?  ?  ? Relevant Medications  ? empagliflozin (JARDIANCE) 25 MG TABS tablet  ? Other Relevant Orders  ? Lipid panel  ? Comprehensive metabolic panel  ? Type 2 diabetes mellitus with hyperglycemia ([facility])  ?  Chronic and uncontrolled ?Failed metformin in the past due to GI upset ?Will try Jardiance '25mg'$  daily ?Discussed diet and exercise ?She is hoping to get back on CGM which helped improve her blood glucoses previously ?Foot exam today ?Advised to schedule eye exam ?UACR today ?Continue ACE inhibitor ?Not on statin-see discussion below ?Follow-up in 3 months repeat A1c ? ?  ?  ? Relevant Medications  ? empagliflozin (JARDIANCE) 25 MG TABS tablet  ? Other Relevant Orders  ? POCT HgB A1C (Completed)  ? Urine Microalbumin w/creat. ratio  ?  ? Other  ? Anxiety disorder - Primary  ?  Chronic and well-controlled ?Continue Zoloft at current dose ? ?  ?  ? Current tobacco use  ?  Discussion regarding the harms of tobacco use, the benefits of cessation, and methods of cessation ?Discussed that there are medication options to help with cessation ?Patient is currently precontemplative  ?Will reassess at next visit  ?  ?  ? History of breast cancer  ?  Completed treatment ?Repeat mammogram ? ?  ?   ? Relevant Orders  ? MM 3D SCREEN BREAST BILATERAL  ? Intermittent claudication (HCC)  ?  When I saw patient 1 month ago, she was reporting that her legs feel heavy when she is walking, but denied any pain

## 2022-03-19 NOTE — Assessment & Plan Note (Signed)
Completed treatment ?Repeat mammogram ?

## 2022-03-19 NOTE — Assessment & Plan Note (Signed)
Well controlled Continue current medications Recheck metabolic panel F/u in 6 months  

## 2022-03-19 NOTE — Assessment & Plan Note (Signed)
Discussion regarding the harms of tobacco use, the benefits of cessation, and methods of cessation ?Discussed that there are medication options to help with cessation ?Patient is currently precontemplative  ?Will reassess at next visit  ?

## 2022-03-19 NOTE — Assessment & Plan Note (Signed)
Chronic and well controlled Continue Zoloft at current dose 

## 2022-03-20 LAB — COMPREHENSIVE METABOLIC PANEL
ALT: 24 IU/L (ref 0–32)
AST: 21 IU/L (ref 0–40)
Albumin/Globulin Ratio: 1.6 (ref 1.2–2.2)
Albumin: 4.1 g/dL (ref 3.8–4.9)
Alkaline Phosphatase: 127 IU/L — ABNORMAL HIGH (ref 44–121)
BUN/Creatinine Ratio: 18 (ref 9–23)
BUN: 17 mg/dL (ref 6–24)
Bilirubin Total: 0.3 mg/dL (ref 0.0–1.2)
CO2: 22 mmol/L (ref 20–29)
Calcium: 8.8 mg/dL (ref 8.7–10.2)
Chloride: 97 mmol/L (ref 96–106)
Creatinine, Ser: 0.92 mg/dL (ref 0.57–1.00)
Globulin, Total: 2.5 g/dL (ref 1.5–4.5)
Glucose: 385 mg/dL — ABNORMAL HIGH (ref 70–99)
Potassium: 4.3 mmol/L (ref 3.5–5.2)
Sodium: 133 mmol/L — ABNORMAL LOW (ref 134–144)
Total Protein: 6.6 g/dL (ref 6.0–8.5)
eGFR: 74 mL/min/{1.73_m2} (ref >59)

## 2022-03-20 LAB — LIPID PANEL
Chol/HDL Ratio: 13.3 ratio — ABNORMAL HIGH (ref 0.0–4.4)
Cholesterol, Total: 305 mg/dL — ABNORMAL HIGH (ref 100–199)
HDL: 23 mg/dL — ABNORMAL LOW (ref >39)
Triglycerides: 1670 mg/dL (ref 0–149)

## 2022-03-20 LAB — MICROALBUMIN / CREATININE URINE RATIO
Creatinine, Urine: 63.9 mg/dL
Microalb/Creat Ratio: 27 mg/g creat (ref 0–29)
Microalbumin, Urine: 17 ug/mL

## 2022-03-20 LAB — HEPATITIS C ANTIBODY: Hep C Virus Ab: NONREACTIVE

## 2022-03-21 ENCOUNTER — Ambulatory Visit (INDEPENDENT_AMBULATORY_CARE_PROVIDER_SITE_OTHER): Payer: BC Managed Care – PPO

## 2022-03-21 DIAGNOSIS — I739 Peripheral vascular disease, unspecified: Secondary | ICD-10-CM

## 2022-03-28 ENCOUNTER — Other Ambulatory Visit: Payer: Self-pay | Admitting: Family Medicine

## 2022-03-28 DIAGNOSIS — I739 Peripheral vascular disease, unspecified: Secondary | ICD-10-CM

## 2022-04-09 ENCOUNTER — Encounter: Payer: Self-pay | Admitting: Family Medicine

## 2022-04-09 ENCOUNTER — Ambulatory Visit (INDEPENDENT_AMBULATORY_CARE_PROVIDER_SITE_OTHER): Payer: BC Managed Care – PPO | Admitting: Family Medicine

## 2022-04-09 VITALS — BP 117/61 | HR 90 | Temp 98.4°F | Resp 16 | Ht 66.0 in | Wt 166.0 lb

## 2022-04-09 DIAGNOSIS — H00021 Hordeolum internum right upper eyelid: Secondary | ICD-10-CM | POA: Diagnosis not present

## 2022-04-09 DIAGNOSIS — L03213 Periorbital cellulitis: Secondary | ICD-10-CM | POA: Insufficient documentation

## 2022-04-09 DIAGNOSIS — R6889 Other general symptoms and signs: Secondary | ICD-10-CM | POA: Diagnosis not present

## 2022-04-09 DIAGNOSIS — H579 Unspecified disorder of eye and adnexa: Secondary | ICD-10-CM | POA: Insufficient documentation

## 2022-04-09 MED ORDER — PREDNISONE 10 MG (21) PO TBPK: ORAL_TABLET | ORAL | 0 refills | Status: DC

## 2022-04-09 MED ORDER — OLOPATADINE HCL 0.2 % OP SOLN: 1.0000 [drp] | Freq: Two times a day (BID) | OPHTHALMIC | 0 refills | Status: DC | PRN

## 2022-04-09 MED ORDER — ERYTHROMYCIN 5 MG/GM OP OINT: 1.0000 "application " | TOPICAL_OINTMENT | Freq: Every day | OPHTHALMIC | 0 refills | Status: DC

## 2022-04-09 NOTE — Assessment & Plan Note (Signed)
Acute, stable Hx of style, internal, upper lid x 5 days Eye is red/itchy/irritated  Recommend new eye makeup Topical abx Allergy eye drops Steroids for inflammation if worsens  RTC if necessary

## 2022-04-09 NOTE — Progress Notes (Signed)
Established patient visit   Patient: Cassandra Butler   DOB: [birth year]   56 y.o. Female  MRN: [id number] Visit Date: 04/09/2022  Today's healthcare provider: Gwyneth Sprout, FNP  Patient presents for new patient visit to establish care.  Introduced to Designer, jewellery role and practice setting.  All questions answered.  Discussed provider/patient relationship and expectations.   I,Tiffany J Bragg,acting as a scribe for Gwyneth Sprout, FNP.,have documented all relevant documentation on the behalf of Gwyneth Sprout, FNP,as directed by  Gwyneth Sprout, FNP while in the presence of Gwyneth Sprout, FNP.   Chief Complaint  Patient presents with   Eye Problem    Patient complains of R eyelid swelling, redness, pain starting 5 days ago.    Subjective    HPI HPI     Eye Problem    Additional comments: Patient complains of R eyelid swelling, redness, pain starting 5 days ago.       Last edited by Smitty Knudsen, CMA on 04/09/2022  3:57 PM.        Medications: Outpatient Medications Prior to Visit  Medication Sig   cetirizine (ZYRTEC ALLERGY) 10 MG tablet Take 1 tablet (10 mg total) by mouth daily.   lisinopril (ZESTRIL) 10 MG tablet Please schedule office visit before any future refill.   omeprazole (PRILOSEC) 20 MG capsule TAKE 1 CAPSULE(20 MG) BY MOUTH DAILY   sertraline (ZOLOFT) 100 MG tablet Please schedule office visit before any future refill.   empagliflozin (JARDIANCE) 25 MG TABS tablet Take 1 tablet (25 mg total) by mouth daily before breakfast. (Patient not taking: Reported on 04/09/2022)   No facility-administered medications prior to visit.    Review of Systems     Objective    BP 117/61 (BP Location: Right Arm, Patient Position: Sitting, Cuff Size: Normal)   Pulse 90   Temp 98.4 F (36.9 C) (Oral)   Resp 16   Ht '5\' 6"'$  (1.676 m)   Wt 166 lb (75.3 kg)   SpO2 100%   BMI 26.79 kg/m    Physical Exam Vitals and nursing note reviewed.  Constitutional:       General: She is not in acute distress.    Appearance: Normal appearance. She is overweight. She is not ill-appearing, toxic-appearing or diaphoretic.  HENT:     Head: Normocephalic and atraumatic.  Eyes:     General:        Right eye: Hordeolum present.      Comments: Encouraged to throw out eye makeup and continue with warm compresses   Cardiovascular:     Rate and Rhythm: Normal rate and regular rhythm.     Pulses: Normal pulses.     Heart sounds: Normal heart sounds. No murmur heard.   No friction rub. No gallop.  Pulmonary:     Effort: Pulmonary effort is normal. No respiratory distress.     Breath sounds: Normal breath sounds. No stridor. No wheezing, rhonchi or rales.  Chest:     Chest wall: No tenderness.  Abdominal:     General: Bowel sounds are normal.     Palpations: Abdomen is soft.  Musculoskeletal:        General: No swelling, tenderness, deformity or signs of injury. Normal range of motion.     Right lower leg: No edema.     Left lower leg: No edema.  Skin:    General: Skin is warm and dry.     Capillary  Refill: Capillary refill takes less than 2 seconds.     Coloration: Skin is not jaundiced or pale.     Findings: No bruising, erythema, lesion or rash.  Neurological:     General: No focal deficit present.     Mental Status: She is alert and oriented to person, place, and time. Mental status is at baseline.     Cranial Nerves: No cranial nerve deficit.     Sensory: No sensory deficit.     Motor: No weakness.     Coordination: Coordination normal.  Psychiatric:        Mood and Affect: Mood normal.        Behavior: Behavior normal.        Thought Content: Thought content normal.        Judgment: Judgment normal.      No results found for any visits on 04/09/22.  Assessment & Plan     Problem List Items Addressed This Visit       Musculoskeletal and Integument   Hordeolum internum of right upper eyelid    Acute, stable Hx of style, internal,  upper lid x 5 days Eye is red/itchy/irritated  Recommend new eye makeup Topical abx Allergy eye drops Steroids for inflammation if worsens  RTC if necessary        Relevant Medications   erythromycin ophthalmic ointment     Other   Itchy eyes    Acute, linked to stye/irration Recommend use of allergy eye drops to assist        Relevant Medications   Olopatadine HCl 0.2 % SOLN   Periorbital cellulitis of right eye - Primary    Acute, stable Linked to current internal style of R upper lid If gets worse- have provided pt with pred dose pack as taper to assist with acute swelling; encourage to f/u with PCP       Relevant Medications   predniSONE (STERAPRED UNI-PAK 21 TAB) 10 MG (21) TBPK tablet     Return if symptoms worsen or fail to improve.      Vonna Kotyk, FNP, have reviewed all documentation for this visit. The documentation on 04/09/22 for the exam, diagnosis, procedures, and orders are all accurate and complete.    Gwyneth Sprout, [place] [phone] (phone) [phone] (fax)  [place]

## 2022-04-09 NOTE — Assessment & Plan Note (Signed)
Acute, stable Linked to current internal style of R upper lid If gets worse- have provided pt with pred dose pack as taper to assist with acute swelling; encourage to f/u with PCP

## 2022-04-09 NOTE — Assessment & Plan Note (Signed)
Acute, linked to stye/irration Recommend use of allergy eye drops to assist

## 2022-04-16 ENCOUNTER — Telehealth: Payer: Self-pay

## 2022-04-16 NOTE — Telephone Encounter (Signed)
-----   Message from Virginia Crews, MD sent at 04/12/2022  7:58 AM EDT ----- Can we reach out to the patient. I guess she is not taking anything for her blood sugar currently as it doesn't seem like something else was prescribed.  Has she tried Metformin in the past? If not, we could do '500mg'$  BID.  If she had GI side effects, could use Metformin XR '500mg'$  daily instead. ----- Message ----- From: Gwyneth Sprout, FNP Sent: 04/12/2022   7:54 AM EDT To: Virginia Crews, MD  Patient noted that she cannot afford Jardiance when I saw her for an acute visit.

## 2022-04-16 NOTE — Telephone Encounter (Signed)
Patient reports she has tried Metformin and Metformin ER in the past. Both medications caused GI side effects. Patient is requesting to try once a week injectable. Please advise.

## 2022-04-18 MED ORDER — OZEMPIC (0.25 OR 0.5 MG/DOSE) 2 MG/3ML ~~LOC~~ SOPN
PEN_INJECTOR | SUBCUTANEOUS | 2 refills | Status: DC
Start: 2022-04-18 — End: 2022-12-24

## 2022-04-18 NOTE — Telephone Encounter (Signed)
Start Ozempic 0.'25mg'$  weekly - please send Rx.  Can f/u on it's efficacy and titrate dose at next visit.  There is a savings card online.

## 2022-04-18 NOTE — Telephone Encounter (Signed)
Patient advised as below.  

## 2022-04-30 ENCOUNTER — Other Ambulatory Visit: Payer: Self-pay | Admitting: Family Medicine

## 2022-04-30 DIAGNOSIS — I10 Essential (primary) hypertension: Secondary | ICD-10-CM

## 2022-05-01 ENCOUNTER — Telehealth: Payer: Self-pay | Admitting: Family Medicine

## 2022-05-01 ENCOUNTER — Other Ambulatory Visit: Payer: Self-pay | Admitting: Family Medicine

## 2022-05-01 DIAGNOSIS — F411 Generalized anxiety disorder: Secondary | ICD-10-CM

## 2022-05-01 NOTE — Telephone Encounter (Signed)
[facility] faxed refill request for the following medications:  cetirizine (ZYRTEC ALLERGY) 10 MG tablet   Please advise.

## 2022-05-01 NOTE — Telephone Encounter (Signed)
Medication Refill - Medication:sertraline (ZOLOFT) 100 MG tablet  Patient wants to knokw why she has to make an appt to receive this medicine, when she has ben on it for years and last appt was May 30 and now she has one scheduled for October 23 that was rescheduled from August.  Has the patient contacted their pharmacy? yes (Agent: If no, request that the patient contact the pharmacy for the refill. If patient does not wish to contact the pharmacy document the reason why and proceed with request.) (Agent: If yes, when and what did the pharmacy advise?)contact pcp  Preferred Pharmacy (with phone number or street name):  [facility] [place], [address] Phone:  [phone]  Fax:  [phone]     Has the patient been seen for an appointment in the last year OR does the patient have an upcoming appointment? yes  Agent: Please be advised that RX refills may take up to 3 business days. We ask that you follow-up with your pharmacy.

## 2022-05-02 MED ORDER — CETIRIZINE HCL 10 MG PO TABS: 10.0000 mg | ORAL_TABLET | Freq: Every day | ORAL | 0 refills | Status: DC

## 2022-05-02 NOTE — Addendum Note (Signed)
Addended by: Shawna Orleans on: 05/02/2022 07:55 AM   Modules accepted: Orders

## 2022-05-02 NOTE — Telephone Encounter (Signed)
It looks like it just didn't get removed from the Rx at last fill. She was seen 03/19/22 for this. She doesn't need another sooner f/u.  It can be refilled if needed.   Her August appt was for diabetes f/u and should have been rescheduled with Dr Ky Barban while I am on leave, not pushed to October. She has uncontrolled diabetes and needs to be seen in August as originally scheduled.

## 2022-06-20 ENCOUNTER — Ambulatory Visit: Payer: BC Managed Care – PPO | Admitting: Family Medicine

## 2022-07-27 ENCOUNTER — Other Ambulatory Visit: Payer: Self-pay | Admitting: Family Medicine

## 2022-07-27 DIAGNOSIS — I10 Essential (primary) hypertension: Secondary | ICD-10-CM

## 2022-09-02 ENCOUNTER — Ambulatory Visit: Payer: BC Managed Care – PPO | Admitting: Family Medicine

## 2022-09-09 ENCOUNTER — Encounter (INDEPENDENT_AMBULATORY_CARE_PROVIDER_SITE_OTHER): Payer: Self-pay

## 2022-11-02 ENCOUNTER — Other Ambulatory Visit: Payer: Self-pay | Admitting: Family Medicine

## 2022-11-02 DIAGNOSIS — I10 Essential (primary) hypertension: Secondary | ICD-10-CM

## 2022-11-02 DIAGNOSIS — F411 Generalized anxiety disorder: Secondary | ICD-10-CM

## 2022-11-05 MED ORDER — SERTRALINE HCL 100 MG PO TABS: 100.0000 mg | ORAL_TABLET | Freq: Every day | ORAL | 0 refills | Status: DC

## 2022-11-05 NOTE — Telephone Encounter (Signed)
Also requesting refill of sertraline (ZOLOFT) 100 MG tablet

## 2022-12-23 NOTE — Progress Notes (Unsigned)
I,Gill Delrossi S Shaden Lacher,acting as a Education administrator for Lavon Paganini, MD.,have documented all relevant documentation on the behalf of Lavon Paganini, MD,as directed by  Lavon Paganini, MD while in the presence of Lavon Paganini, MD.     Established patient visit   Patient: Cassandra Butler   DOB: 1966/01/29   57 y.o. Female  MRN: [id number] Visit Date: 12/24/2022  Today's healthcare provider: Lavon Paganini, MD   Chief Complaint  Patient presents with   Referral   Subjective    HPI  Patient requesting referral to rheumatology. She reports pain, swelling and decreased motion in hand and arms. She reports family history of rheumatoid arthritis. Patient requesting to see Dr. Posey Pronto at [place] [facility].  Brother, dad, and mom all with RA.   Most pain in elbows, hands, fingers. Taking ibuprofen and tylenol arthritis with no relief.  Still has trigger finger, but hand wont close enough for it to catch.  Medications: Outpatient Medications Prior to Visit  Medication Sig   lisinopril (ZESTRIL) 10 MG tablet TAKE 1 TABLET BY MOUTH DAILY. Please schedule office visit before any future refill.   omeprazole (PRILOSEC) 20 MG capsule TAKE 1 CAPSULE(20 MG) BY MOUTH DAILY   sertraline (ZOLOFT) 100 MG tablet Take 1 tablet (100 mg total) by mouth daily. Please schedule office visit before any future refill.   empagliflozin (JARDIANCE) 25 MG TABS tablet Take 1 tablet (25 mg total) by mouth daily before breakfast.   [DISCONTINUED] cetirizine (ZYRTEC ALLERGY) 10 MG tablet Take 1 tablet (10 mg total) by mouth daily.   [DISCONTINUED] erythromycin ophthalmic ointment Place 1 application. into the right eye at bedtime.   [DISCONTINUED] Olopatadine HCl 0.2 % SOLN Apply 1 drop to eye 2 (two) times daily as needed. (Patient not taking: Reported on 12/24/2022)   [DISCONTINUED] predniSONE (STERAPRED UNI-PAK 21 TAB) 10 MG (21) TBPK tablet Take as directed on package; if eye symptoms worsen.   [DISCONTINUED]  Semaglutide,0.25 or 0.5MG/DOS, (OZEMPIC, 0.25 OR 0.5 MG/DOSE,) 2 MG/3ML SOPN Inject 0.38m into skin once a week.   No facility-administered medications prior to visit.    Review of Systems  Constitutional:  Negative for chills and fatigue.  Respiratory:  Negative for chest tightness.   Cardiovascular:  Negative for chest pain.  Musculoskeletal:  Positive for arthralgias, back pain, joint swelling and myalgias.       Objective    BP 118/71 (BP Location: Left Arm, Patient Position: Sitting, Cuff Size: Large)   Pulse 93   Temp 97.8 F (36.6 C) (Temporal)   Resp 16   Ht 5' 6"$  (1.676 m)   Wt 168 lb 6.4 oz (76.4 kg)   SpO2 96%   BMI 27.18 kg/m    Physical Exam Vitals reviewed.  Constitutional:      General: She is not in acute distress.    Appearance: Normal appearance. She is well-developed. She is not diaphoretic.  HENT:     Head: Normocephalic and atraumatic.  Eyes:     General: No scleral icterus.    Conjunctiva/sclera: Conjunctivae normal.  Neck:     Thyroid: No thyromegaly.  Cardiovascular:     Rate and Rhythm: Normal rate and regular rhythm.     Pulses: Normal pulses.     Heart sounds: Normal heart sounds. No murmur heard. Pulmonary:     Effort: Pulmonary effort is normal. No respiratory distress.     Breath sounds: Normal breath sounds. No wheezing, rhonchi or rales.  Musculoskeletal:     Cervical back: Neck  supple.     Right lower leg: No edema.     Left lower leg: No edema.  Lymphadenopathy:     Cervical: No cervical adenopathy.  Skin:    General: Skin is warm and dry.     Findings: No rash.  Neurological:     Mental Status: She is alert and oriented to person, place, and time. Mental status is at baseline.  Psychiatric:        Mood and Affect: Mood normal.        Behavior: Behavior normal.      No results found for any visits on 12/24/22.  Assessment & Plan     Problem List Items Addressed This Visit       Endocrine   Hyperlipidemia  associated with type 2 diabetes mellitus ([place])   Relevant Orders   Lipid panel   Comprehensive metabolic panel   CBC   Type 2 diabetes mellitus with hyperglycemia (HCC)   Relevant Orders   Hemoglobin A1c   CBC   Other Visit Diagnoses     Polyarthralgia    -  Primary   Relevant Orders   Ambulatory referral to Rheumatology   Rheumatoid Factor   ANA Direct w/Reflex if Positive   CYCLIC CITRUL PEPTIDE ANTIBODY, IGG/IGA   Sed Rate (ESR)   C-reactive protein   CBC   Joint stiffness       Relevant Orders   Ambulatory referral to Rheumatology   Rheumatoid Factor   ANA Direct w/Reflex if Positive   CYCLIC CITRUL PEPTIDE ANTIBODY, IGG/IGA   Sed Rate (ESR)   C-reactive protein   CBC   Bilateral hand swelling       Relevant Orders   Ambulatory referral to Rheumatology   Rheumatoid Factor   ANA Direct w/Reflex if Positive   CYCLIC CITRUL PEPTIDE ANTIBODY, IGG/IGA   Sed Rate (ESR)   C-reactive protein   CBC      Ongoing polyarthrlgias, joint stiffness and swelling with strong fam hx of RA - will check some rheum labs and refer to Rheum for eval nad management - prednisone burst and taper for the interim   Upcoming f/u for diabetes/etc, getting labs now prior to that visit  Meds ordered this encounter  Medications   predniSONE (DELTASONE) 10 MG tablet    Sig: Take 58m PO daily x2d, then 534mdaily x2d, then 4021maily x2d, then 60m7mily x2d, then 20mg105mly x2d, then 10mg 78my x2d, then stop    Dispense:  42 tablet    Refill:  0     Return in about 1 week (around 12/31/2022) for chronic disease f/u.      I, AngelaLavon Paganinihave reviewed all documentation for this visit. The documentation on 12/24/22 for the exam, diagnosis, procedures, and orders are all accurate and complete.   [place], AngelaDionne BucyMPH BurlinLevering

## 2022-12-24 ENCOUNTER — Ambulatory Visit (INDEPENDENT_AMBULATORY_CARE_PROVIDER_SITE_OTHER): Payer: Self-pay | Admitting: Family Medicine

## 2022-12-24 ENCOUNTER — Encounter: Payer: Self-pay | Admitting: Family Medicine

## 2022-12-24 VITALS — BP 118/71 | HR 93 | Temp 97.8°F | Resp 16 | Ht 66.0 in | Wt 168.4 lb

## 2022-12-24 DIAGNOSIS — E1165 Type 2 diabetes mellitus with hyperglycemia: Secondary | ICD-10-CM | POA: Diagnosis not present

## 2022-12-24 DIAGNOSIS — M2560 Stiffness of unspecified joint, not elsewhere classified: Secondary | ICD-10-CM

## 2022-12-24 DIAGNOSIS — E1169 Type 2 diabetes mellitus with other specified complication: Secondary | ICD-10-CM

## 2022-12-24 DIAGNOSIS — E785 Hyperlipidemia, unspecified: Secondary | ICD-10-CM | POA: Diagnosis not present

## 2022-12-24 DIAGNOSIS — M7989 Other specified soft tissue disorders: Secondary | ICD-10-CM

## 2022-12-24 DIAGNOSIS — M2550 Pain in unspecified joint: Secondary | ICD-10-CM

## 2022-12-24 MED ORDER — PREDNISONE 10 MG PO TABS: ORAL_TABLET | ORAL | 0 refills | Status: DC

## 2022-12-26 LAB — CBC
Hematocrit: 37.5 % (ref 34.0–46.6)
Hemoglobin: 12.6 g/dL (ref 11.1–15.9)
MCH: 28.4 pg (ref 26.6–33.0)
MCHC: 33.6 g/dL (ref 31.5–35.7)
MCV: 85 fL (ref 79–97)
Platelets: 88 10*3/uL — CL (ref 150–450)
RBC: 4.43 x10E6/uL (ref 3.77–5.28)
RDW: 14.1 % (ref 11.7–15.4)
WBC: 3.7 10*3/uL (ref 3.4–10.8)

## 2022-12-26 LAB — COMPREHENSIVE METABOLIC PANEL
ALT: 23 IU/L (ref 0–32)
AST: 24 IU/L (ref 0–40)
Albumin/Globulin Ratio: 2 (ref 1.2–2.2)
Albumin: 4.8 g/dL (ref 3.8–4.9)
Alkaline Phosphatase: 104 IU/L (ref 44–121)
BUN/Creatinine Ratio: 16 (ref 9–23)
BUN: 11 mg/dL (ref 6–24)
Bilirubin Total: 0.3 mg/dL (ref 0.0–1.2)
CO2: 22 mmol/L (ref 20–29)
Calcium: 9.6 mg/dL (ref 8.7–10.2)
Chloride: 101 mmol/L (ref 96–106)
Creatinine, Ser: 0.67 mg/dL (ref 0.57–1.00)
Globulin, Total: 2.4 g/dL (ref 1.5–4.5)
Glucose: 177 mg/dL — ABNORMAL HIGH (ref 70–99)
Potassium: 4.1 mmol/L (ref 3.5–5.2)
Sodium: 138 mmol/L (ref 134–144)
Total Protein: 7.2 g/dL (ref 6.0–8.5)
eGFR: 103 mL/min/{1.73_m2} (ref >59)

## 2022-12-26 LAB — SEDIMENTATION RATE: Sed Rate: 33 mm/hr (ref 0–40)

## 2022-12-26 LAB — HEMOGLOBIN A1C
Est. average glucose Bld gHb Est-mCnc: 200 mg/dL
Hgb A1c MFr Bld: 8.6 % — ABNORMAL HIGH (ref 4.8–5.6)

## 2022-12-26 LAB — LIPID PANEL
Chol/HDL Ratio: 10.6 ratio — ABNORMAL HIGH (ref 0.0–4.4)
Cholesterol, Total: 306 mg/dL — ABNORMAL HIGH (ref 100–199)
HDL: 29 mg/dL — ABNORMAL LOW (ref >39)
Triglycerides: 1036 mg/dL (ref 0–149)

## 2022-12-26 LAB — C-REACTIVE PROTEIN: CRP: 2 mg/L (ref 0–10)

## 2022-12-26 LAB — RHEUMATOID FACTOR: Rheumatoid fact SerPl-aCnc: 13.9 IU/mL (ref <14.0)

## 2022-12-26 LAB — CYCLIC CITRUL PEPTIDE ANTIBODY, IGG/IGA: Cyclic Citrullin Peptide Ab: 6 units (ref 0–19)

## 2022-12-26 LAB — ANA W/REFLEX IF POSITIVE: Anti Nuclear Antibody (ANA): NEGATIVE

## 2022-12-30 NOTE — Progress Notes (Unsigned)
I,Taevyn Hausen S April Colter,acting as a Education administrator for Lavon Paganini, MD.,have documented all relevant documentation on the behalf of Lavon Paganini, MD,as directed by  Lavon Paganini, MD while in the presence of Lavon Paganini, MD.     Established patient visit   Patient: Cassandra Butler   DOB: 02/08/1966   57 y.o. Female  MRN: [id number] Visit Date: 12/31/2022  Today's healthcare provider: Lavon Paganini, MD   No chief complaint on file.  Subjective    HPI  Diabetes Mellitus Type II, follow-up  Lab Results  Component Value Date   HGBA1C 8.6 (H) 12/24/2022   HGBA1C 11.1 (A) 03/19/2022   HGBA1C 5.9 (A) 04/23/2021   Last seen for diabetes 9 months ago.  Management since then includes start Jardiance 25 mg daily. She reports {excellent/good/fair/poor:19665} compliance with treatment. She {is/is not:21021397} having side effects. {document side effects if present:1}  Home blood sugar records: {diabetes glucometry results:16657}  Episodes of hypoglycemia? {Yes/No:20286} {enter details if yes:1}   Current insulin regiment: none Most Recent Eye Exam:  --------------------------------------------------------------------------------------------------- Hypertension, follow-up  BP Readings from Last 3 Encounters:  12/24/22 118/71  04/09/22 117/61  03/19/22 99/63   Wt Readings from Last 3 Encounters:  12/24/22 168 lb 6.4 oz (76.4 kg)  04/09/22 166 lb (75.3 kg)  03/19/22 169 lb (76.7 kg)     She was last seen for hypertension 9 months ago.  BP at that visit was 99/63. Management since that visit includes no changes. She reports {excellent/good/fair/poor:19665} compliance with treatment. She {is/is not:9024} having side effects. {document side effects if present:1}  Outside blood pressures are {enter patient reported home BP, or 'not being  checked':1}.  --------------------------------------------------------------------------------------------------- Lipid/Cholesterol, follow-up  Last Lipid Panel: Lab Results  Component Value Date   CHOL 306 (H) 12/24/2022   LDLCALC Comment (A) 12/24/2022   LDLDIRECT 37 07/28/2019   HDL 29 (L) 12/24/2022   TRIG 1,036 (HH) 12/24/2022    She was last seen for this 9 months ago.  Management since that visit includes no changes.  She is  not on statins  Last metabolic panel Lab Results  Component Value Date   GLUCOSE 177 (H) 12/24/2022   NA 138 12/24/2022   K 4.1 12/24/2022   BUN 11 12/24/2022   CREATININE 0.67 12/24/2022   EGFR 103 12/24/2022   GFRNONAA 56 (L) 07/02/2021   CALCIUM 9.6 12/24/2022   AST 24 12/24/2022   ALT 23 12/24/2022   The 10-year ASCVD risk score (Arnett DK, et al., 2019) is: 27.3%  --------------------------------------------------------------------------------------------------- Anxiety, Follow-up  She was last seen for anxiety 9 months ago. Changes made at last visit include no changes. Continue Zoloft   She reports {excellent/good/fair/poor:19665} compliance with treatment. She reports {good/fair/poor:18685} tolerance of treatment. She {is/is not:21021397} having side effects. {document side effects if present:1}  She feels her anxiety is {Desc; severity:60313} and {improved/worse/unchanged:3041574} since last visit.  GAD-7 Results    04/23/2021    4:00 PM  GAD-7 Generalized Anxiety Disorder Screening Tool  1. Feeling Nervous, Anxious, or on Edge 0  2. Not Being Able to Stop or Control Worrying 0  3. Worrying Too Much About Different Things 0  4. Trouble Relaxing 0  5. Being So Restless it's Hard To Sit Still 0  6. Becoming Easily Annoyed or Irritable 0  7. Feeling Afraid As If Something Awful Might Happen 0  Total GAD-7 Score 0  Difficulty At Work, Home, or Getting  Along With Others? Not difficult at all    PHQ-9  Scores     04/09/2022    3:59 PM 03/19/2022   11:24 AM 02/14/2022    3:46 PM  PHQ9 SCORE ONLY  PHQ-9 Total Score 0 2 6    ---------------------------------------------------------------------------------------------------   Medications: Outpatient Medications Prior to Visit  Medication Sig   empagliflozin (JARDIANCE) 25 MG TABS tablet Take 1 tablet (25 mg total) by mouth daily before breakfast.   lisinopril (ZESTRIL) 10 MG tablet TAKE 1 TABLET BY MOUTH DAILY. Please schedule office visit before any future refill.   omeprazole (PRILOSEC) 20 MG capsule TAKE 1 CAPSULE(20 MG) BY MOUTH DAILY   predniSONE (DELTASONE) 10 MG tablet Take 56m PO daily x2d, then 519mdaily x2d, then 40110maily x2d, then 19m44mily x2d, then 20mg62mly x2d, then 10mg 19my x2d, then stop   sertraline (ZOLOFT) 100 MG tablet Take 1 tablet (100 mg total) by mouth daily. Please schedule office visit before any future refill.   No facility-administered medications prior to visit.    Review of Systems  Constitutional:  Negative for chills and fatigue.  Respiratory:  Negative for chest tightness and shortness of breath.   Cardiovascular:  Negative for chest pain and leg swelling.  Gastrointestinal:  Negative for abdominal pain, nausea and vomiting.  Neurological:  Negative for dizziness, light-headedness and headaches.    {Labs  Heme  Chem  Endocrine  Serology  Results Review (optional):23779}   Objective    There were no vitals taken for this visit. {Show previous vital signs (optional):23777}  Physical Exam  ***  No results found for any visits on 12/31/22.  Assessment & Plan     ***  No follow-ups on file.      {provider attestation***:1}   AngelaLavon PaganiniCone HTampa Community [phone]) [phone]  [facility]

## 2022-12-31 ENCOUNTER — Ambulatory Visit (INDEPENDENT_AMBULATORY_CARE_PROVIDER_SITE_OTHER): Payer: BC Managed Care – PPO | Admitting: Family Medicine

## 2022-12-31 ENCOUNTER — Encounter: Payer: Self-pay | Admitting: Family Medicine

## 2022-12-31 VITALS — BP 109/69 | HR 79 | Temp 97.6°F | Resp 16 | Wt 164.0 lb

## 2022-12-31 DIAGNOSIS — E1169 Type 2 diabetes mellitus with other specified complication: Secondary | ICD-10-CM

## 2022-12-31 DIAGNOSIS — R29898 Other symptoms and signs involving the musculoskeletal system: Secondary | ICD-10-CM

## 2022-12-31 DIAGNOSIS — M5416 Radiculopathy, lumbar region: Secondary | ICD-10-CM

## 2022-12-31 DIAGNOSIS — E1165 Type 2 diabetes mellitus with hyperglycemia: Secondary | ICD-10-CM

## 2022-12-31 DIAGNOSIS — E1159 Type 2 diabetes mellitus with other circulatory complications: Secondary | ICD-10-CM

## 2022-12-31 DIAGNOSIS — I152 Hypertension secondary to endocrine disorders: Secondary | ICD-10-CM

## 2022-12-31 DIAGNOSIS — E785 Hyperlipidemia, unspecified: Secondary | ICD-10-CM

## 2022-12-31 DIAGNOSIS — F411 Generalized anxiety disorder: Secondary | ICD-10-CM

## 2022-12-31 DIAGNOSIS — E781 Pure hyperglyceridemia: Secondary | ICD-10-CM

## 2022-12-31 MED ORDER — FENOFIBRATE 145 MG PO TABS: 145.0000 mg | ORAL_TABLET | Freq: Every day | ORAL | 1 refills | Status: DC

## 2022-12-31 MED ORDER — EMPAGLIFLOZIN 25 MG PO TABS: 25.0000 mg | ORAL_TABLET | Freq: Every day | ORAL | 3 refills | Status: DC

## 2022-12-31 NOTE — Assessment & Plan Note (Signed)
Chronic and uncontrolled Not on any meds currently Failed metformin and GLP1 due to GI SEs Was previously unable to afford Jardiance - discussed savings card - will try again to Rx Jardiance 25 mg daily F/u in 70mand recheck a1c

## 2022-12-31 NOTE — Assessment & Plan Note (Signed)
Reviewed last lipid panel Likely needs statin due to diabetes Check direct LDL with next labs

## 2022-12-31 NOTE — Assessment & Plan Note (Addendum)
New problem Has known DJD of L spine  Already on prednisone - continue taper, not helping Previous XRay 4/23 Now with new radiculopathy with loss of strength Needs MRI due to red flags of neuro symptoms Referral to NSG also

## 2022-12-31 NOTE — Assessment & Plan Note (Signed)
Well controlled Continue current medications Reviewed metabolic panel F/u in 3 months

## 2022-12-31 NOTE — Assessment & Plan Note (Signed)
Chronic and well controlled Continue Zoloft at current dose

## 2022-12-31 NOTE — Assessment & Plan Note (Signed)
Very elevated triglycerides Possible familial hypertriglyceridemia, hyperglycemia also contributes No h/o pancreatitis, but sister had h/o recurrent pancreatitis Treat with fenofibrates Recheck in 3 months

## 2023-01-07 ENCOUNTER — Telehealth: Payer: Self-pay | Admitting: Family Medicine

## 2023-01-07 ENCOUNTER — Ambulatory Visit: Admission: RE | Admit: 2023-01-07 | Payer: BC Managed Care – PPO | Source: Ambulatory Visit

## 2023-01-07 DIAGNOSIS — Z7960 Long term (current) use of unspecified immunomodulators and immunosuppressants: Secondary | ICD-10-CM | POA: Diagnosis not present

## 2023-01-07 DIAGNOSIS — Z111 Encounter for screening for respiratory tuberculosis: Secondary | ICD-10-CM | POA: Diagnosis not present

## 2023-01-07 DIAGNOSIS — E1165 Type 2 diabetes mellitus with hyperglycemia: Secondary | ICD-10-CM

## 2023-01-07 DIAGNOSIS — M0609 Rheumatoid arthritis without rheumatoid factor, multiple sites: Secondary | ICD-10-CM | POA: Diagnosis not present

## 2023-01-07 MED ORDER — RYBELSUS 3 MG PO TABS: 3.0000 mg | ORAL_TABLET | Freq: Every day | ORAL | 3 refills | Status: DC

## 2023-01-07 NOTE — Telephone Encounter (Signed)
Copied from [place] [id number]. Topic: General - Inquiry >> Jan 06, 2023  3:10 PM Ludger Nutting wrote: Patient states that even with insurance and a coupon the empagliflozin (JARDIANCE) 25 MG TABS tablet is still over 500.00. Patient wants to know if there is an alternative that is more affordable. Please follow up with patient.

## 2023-01-07 NOTE — Telephone Encounter (Signed)
Copied from [place] [id number]. Topic: General - Other >> Jan 07, 2023 12:17 PM Burman Freestone wrote: Reason for CRM: Cassandra Butler is requesting peer to peer to get MRI approved Case # [id number] Phone [phone] option 4

## 2023-01-07 NOTE — Telephone Encounter (Signed)
Pt stated medication empagliflozin (JARDIANCE) 25 MG TABS tablet and Semaglutide (RYBELSUS) 3 MG TABS are still too expensive; she cannot afford them; mentioned they are still almost $1,500.00.   Pt stated the savings card for Rx doesn't allow her to get it for 10 dollars; it only allows a maximum of $175.00.  Requesting alternative or clinical advice on how to move forward.  Please advise.

## 2023-01-07 NOTE — Telephone Encounter (Signed)
Patient advised.

## 2023-01-07 NOTE — Telephone Encounter (Signed)
Called and was told that I didn't need to talk to someone and do peer to peer.  They seemed confused.  Are supposed to let us know if they change their mind

## 2023-01-07 NOTE — Telephone Encounter (Signed)
Let's send in Rybelsus '3mg'$  daily and see how this is covered by her insurance. There is also a copay card for this.

## 2023-01-09 MED ORDER — GLIPIZIDE 5 MG PO TABS: 5.0000 mg | ORAL_TABLET | Freq: Two times a day (BID) | ORAL | 3 refills | Status: DC

## 2023-01-09 NOTE — Addendum Note (Signed)
Addended by: Virginia Crews on: 01/09/2023 08:00 AM   Modules accepted: Orders

## 2023-01-14 NOTE — Addendum Note (Signed)
Addended by: Shawna Orleans on: 01/14/2023 08:16 AM   Modules accepted: Orders

## 2023-01-14 NOTE — Telephone Encounter (Signed)
Patient agreed to referral to pharmacy.

## 2023-01-16 ENCOUNTER — Ambulatory Visit: Admission: RE | Admit: 2023-01-16 | Payer: BC Managed Care – PPO | Source: Ambulatory Visit

## 2023-01-16 ENCOUNTER — Telehealth: Payer: Self-pay

## 2023-01-16 NOTE — Progress Notes (Signed)
   Care Guide Note  01/16/2023 Name: Cassandra Butler MRN: [id number] DOB: 11-23-1965  Referred by: Virginia Crews, MD Reason for referral : Care Coordination (Outreach to schedule with Pharm d )   Cassandra Butler is a 57 y.o. year old female who is a primary care patient of Brita Romp, Dionne Bucy, MD. Cassandra Butler was referred to the pharmacist for assistance related to DM.    Successful contact was made with the patient to discuss pharmacy services including being ready for the pharmacist to call at least 5 minutes before the scheduled appointment time, to have medication bottles and any blood sugar or blood pressure readings ready for review. The patient agreed to meet with the pharmacist via with the pharmacist via telephone visit on (date/time).  02/19/2023  Noreene Larsson, [place], [place] [address] Direct Dial: [phone] [email or web address]

## 2023-01-17 NOTE — Progress Notes (Deleted)
Referring Physician:  Virginia Crews, MD [address] [place],  [place] [address]  Primary Physician:  Virginia Crews, MD  History of Present Illness: 01/17/2023 Cassandra Butler is here today with a chief complaint of ***   Low back pain? Leg pain? Weakness in the right leg?  Duration: *** Location: *** Quality: *** Severity: *** 10/10 Precipitating: aggravated by ***walking? Modifying factors: made better by ***nothing? Weakness: none Timing: ***constant Bowel/Bladder Dysfunction: none  Conservative measures:  Physical therapy: *** has not participated in? Multimodal medical therapy including regular antiinflammatories: *** prednisone Injections: *** has not received epidural steroid injections  Past Surgery: ***denies  Cassandra Butler has ***no symptoms of cervical myelopathy.  The symptoms are causing a significant impact on the patient's life.   I have utilized the care everywhere function in epic to review the outside records available from external health systems.  Review of Systems:  A 10 point review of systems is negative, except for the pertinent positives and negatives detailed in the HPI.  Past Medical History: Past Medical History:  Diagnosis Date   Cancer ([place]) 11/14/2007   Left breast wide excision, sentinel node biopsy. 1.4 cm, T1c, N0: ER 90%; PR 905; Her 2 neu not over expressed.    Diabetes mellitus without complication (HCC)    GERD (gastroesophageal reflux disease)    Hyperlipidemia    Hypertension    Vitamin D deficiency     Past Surgical History: Past Surgical History:  Procedure Laterality Date   ABDOMINAL HYSTERECTOMY  2004   total, due to fibriods. Also had ovaries removed after 1 year of breast cancer.    BREAST LUMPECTOMY Left 2010   COLONOSCOPY     25 yrs ago   COLONOSCOPY  04/16/2010   Dr Candace Cruise    Allergies: Allergies as of 01/21/2023   (No Known Allergies)    Medications: No outpatient  medications have been marked as taking for the 01/21/23 encounter (Appointment) with Meade Maw, MD.    Social History: Social History   Tobacco Use   Smoking status: Every Day    Packs/day: 1.00    Types: Cigarettes   Smokeless tobacco: Never  Substance Use Topics   Alcohol use: No   Drug use: No    Family Medical History: Family History  Problem Relation Age of Onset   Diabetes Mother     Physical Examination: There were no vitals filed for this visit.  General: Patient is well developed, well nourished, calm, collected, and in no apparent distress. Attention to examination is appropriate.  Neck:   Supple.  Full range of motion.  Respiratory: Patient is breathing without any difficulty.   NEUROLOGICAL:     Awake, alert, oriented to person, place, and time.  Speech is clear and fluent.   Cranial Nerves: Pupils equal round and reactive to light.  Facial tone is symmetric.  Facial sensation is symmetric. Shoulder shrug is symmetric. Tongue protrusion is midline.  There is no pronator drift.  ROM of spine: full.    Strength: Side Biceps Triceps Deltoid Interossei Grip Wrist Ext. Wrist Flex.  R '5 5 5 5 5 5 5  '$ L '5 5 5 5 5 5 5   '$ Side Iliopsoas Quads Hamstring PF DF EHL  R '5 5 5 5 5 5  '$ L '5 5 5 5 5 5   '$ Reflexes are ***2+ and symmetric at the biceps, triceps, brachioradialis, patella and achilles.   Hoffman's is absent.  Bilateral upper and lower extremity sensation is intact to light touch.    No evidence of dysmetria noted.  Gait is normal.     Medical Decision Making  Imaging: ***  I have personally reviewed the images and agree with the above interpretation.  Assessment and Plan: Ms. Doepke is a pleasant 57 y.o. female with ***    Thank you for involving me in the care of this patient.      Chester K. Izora Ribas MD, [facility]

## 2023-01-21 ENCOUNTER — Ambulatory Visit: Payer: Self-pay | Admitting: Neurosurgery

## 2023-01-26 NOTE — Progress Notes (Signed)
Referring Physician:  Erasmo Downer, MD [address] [place],  [place] [address]  Primary Physician:  Erasmo Downer, MD  History of Present Illness: 01/28/2023 Cassandra Butler has a history of DM, hyperlipidemia, anxiety, RA, OSA, and breast CA.   Has seen rheumatology for RA.   PCP ordered lumbar MRI and it was denied.   Primary complaint is 2 month history of constant LBP with  intermittent spasms in her lower back that are worse at night (wakes her up). She has difficulty walking as her legs "feel like they are 500 lbs." Legs feel like this constantly. 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 She had some numbness/tingling in right leg that resolved 2 weeks ago. She's had giving way of right leg, but this has not happened in 13 days.    No relief with prednisone for her back.   Bowel/Bladder Dysfunction: none  Conservative measures:  Physical therapy: has not participated  Multimodal medical therapy including regular antiinflammatories: prednisone, ibuprofen Injections: has not received epidural steroid injections  Past Surgery: no prior spine surgery  HAIDEE Butler has no symptoms of cervical myelopathy.  The symptoms are causing a significant impact on the patient's life.   Review of Systems:  A 10 point review of systems is negative, except for the pertinent positives and negatives detailed in the HPI.  Past Medical History: Past Medical History:  Diagnosis Date   Cancer (HCC) 11/14/2007   Left breast wide excision, sentinel node biopsy. 1.4 cm, T1c, N0: ER 90%; PR 905; Her 2 neu not over expressed.    Diabetes mellitus without complication (HCC)    GERD (gastroesophageal reflux disease)    Hyperlipidemia    Hypertension    Vitamin D deficiency     Past Surgical History: Past Surgical History:  Procedure Laterality Date   ABDOMINAL HYSTERECTOMY  2004   total, due to fibriods. Also had ovaries removed after 1 year  of breast cancer.    BREAST LUMPECTOMY Left 2010   COLONOSCOPY     25 yrs ago   COLONOSCOPY  04/16/2010   Dr Bluford Kaufmann    Allergies: Allergies as of 01/28/2023   (No Known Allergies)    Medications: Outpatient Encounter Medications as of 01/28/2023  Medication Sig   folic acid (FOLVITE) 1 MG tablet Take 1 mg by mouth daily.   hydroxychloroquine (PLAQUENIL) 200 MG tablet Take 200 mg by mouth daily.   methotrexate (RHEUMATREX) 2.5 MG tablet Take 15 mg by mouth once a week.   fenofibrate (TRICOR) 145 MG tablet Take 1 tablet (145 mg total) by mouth daily.   lisinopril (ZESTRIL) 10 MG tablet TAKE 1 TABLET BY MOUTH DAILY. Please schedule office visit before any future refill.   omeprazole (PRILOSEC) 20 MG capsule TAKE 1 CAPSULE(20 MG) BY MOUTH DAILY   sertraline (ZOLOFT) 100 MG tablet Take 1 tablet (100 mg total) by mouth daily. Please schedule office visit before any future refill.   [DISCONTINUED] glipiZIDE (GLUCOTROL) 5 MG tablet Take 1 tablet (5 mg total) by mouth 2 (two) times daily before a meal.   [DISCONTINUED] predniSONE (DELTASONE) 10 MG tablet Take 60mg  PO daily x2d, then 50mg  daily x2d, then 40mg  daily x2d, then 30mg  daily x2d, then 20mg  daily x2d, then 10mg  daily x2d, then stop   No facility-administered encounter medications on file as of 01/28/2023.    Social History: Social History   Tobacco Use   Smoking status: Every Day  Packs/day: 1    Types: Cigarettes   Smokeless tobacco: Never  Vaping Use   Vaping Use: Never used  Substance Use Topics   Alcohol use: No   Drug use: No    Family Medical History: Family History  Problem Relation Age of Onset   Diabetes Mother     Physical Examination: Vitals:   01/28/23 1311  BP: 110/64    General: Patient is well developed, well nourished, calm, collected, and in no apparent distress. Attention to examination is appropriate.  Respiratory: Patient is breathing without any difficulty.   NEUROLOGICAL:     Awake,  alert, oriented to person, place, and time.  Speech is clear and fluent. Fund of knowledge is appropriate.   Cranial Nerves: Pupils equal round and reactive to light.  Facial tone is symmetric.    Minimal right sided lower lumbar tenderness.  Reasonable ROM of lumbar spine without pain.   No abnormal lesions on exposed skin.   Strength: Side Biceps Triceps Deltoid Interossei Grip Wrist Ext. Wrist Flex.  R 5 5 5 5 5 5 5   L 5 5 5 5 5 5 5    Side Iliopsoas Quads Hamstring PF DF EHL  R 5 5 5 5 5 5   L 5 5 5 5 5 5    Reflexes are 2+ and symmetric at the biceps, triceps, brachioradialis, patella and achilles.   Hoffman's is absent.  Clonus is not present.   Bilateral upper and lower extremity sensation is intact to light touch.     Gait is normal.    Medical Decision Making  Imaging: Lumbar xrays dated 02/14/22:  FINDINGS: Five lumbar type vertebral bodies are well visualized. Vertebral body height is well maintained. No pars defects are noted. Mild facet hypertrophic changes are seen. No soft tissue abnormality is seen. Diffuse aortic calcifications are noted.   IMPRESSION: Mild degenerative change without acute abnormality.     Electronically Signed   By: Alcide Clever M.D.   On: 02/16/2022 02:35    I have personally reviewed the images and agree with the above interpretation.  Assessment and Plan: [name] is a pleasant 57 y.o. female has  2 month history of constant LBP with  intermittent spasms in her lower back that are worse at night (wakes her up). She has difficulty walking as her legs "feel like they are 500 lbs." Legs feel like this constantly.   She has known lumbar spondylosis from xrays done 02/14/22. No relief with prednisone. Symptoms are suspicious for spinal stenosis. She also has known RA.   Treatment options discussed with patient and following plan made:   - MRI of lumbar to further evaluate lumbar radiculopathy. No improvement with time or medications  (prednisone).  - Continue to follow with rheumatology for RA.  - Will call her and schedule phone visit with me to review MRI results once I have them back.   I spent a total of 25 minutes in face-to-face and non-face-to-face activities related to this patient's care today including review of outside records, review of imaging, review of symptoms, physical exam, discussion of differential diagnosis, discussion of treatment options, and documentation.   Thank you for involving me in the care of this patient.   Drake Leach PA-C Dept. of Neurosurgery

## 2023-01-28 ENCOUNTER — Encounter: Payer: Self-pay | Admitting: Orthopedic Surgery

## 2023-01-28 ENCOUNTER — Ambulatory Visit (INDEPENDENT_AMBULATORY_CARE_PROVIDER_SITE_OTHER): Payer: BC Managed Care – PPO | Admitting: Orthopedic Surgery

## 2023-01-28 VITALS — BP 110/64 | Ht 66.0 in | Wt 164.0 lb

## 2023-01-28 DIAGNOSIS — M47816 Spondylosis without myelopathy or radiculopathy, lumbar region: Secondary | ICD-10-CM

## 2023-01-28 DIAGNOSIS — M5416 Radiculopathy, lumbar region: Secondary | ICD-10-CM | POA: Diagnosis not present

## 2023-01-28 NOTE — Patient Instructions (Signed)
It was so nice to see you today. Thank you so much for coming in.    Your previous lumbar xrays showed some mild wear and tear in your back and I think this may be causing your pain.   I want to get an MRI of your lumbar spine to look into things further. We will get this approved through your insurance and [facility] will call you to schedule the appointment.   Once I have the MRI results, we will call you to schedule a phone visit with me to review them.   Please do not hesitate to call if you have any questions or concerns. You can also message me in [place].   If you have not heard back about any the MRI in the next week, please call the office so we can help you get it scheduled.   Geronimo Boot PA-C [phone]

## 2023-02-01 ENCOUNTER — Other Ambulatory Visit: Payer: Self-pay | Admitting: Family Medicine

## 2023-02-01 DIAGNOSIS — I10 Essential (primary) hypertension: Secondary | ICD-10-CM

## 2023-02-01 DIAGNOSIS — F411 Generalized anxiety disorder: Secondary | ICD-10-CM

## 2023-02-03 ENCOUNTER — Ambulatory Visit
Admission: RE | Admit: 2023-02-03 | Discharge: 2023-02-03 | Disposition: A | Discharge status: Home / Self Care | Payer: BC Managed Care – PPO | Source: Ambulatory Visit | Attending: Orthopedic Surgery | Admitting: Orthopedic Surgery

## 2023-02-03 DIAGNOSIS — M47816 Spondylosis without myelopathy or radiculopathy, lumbar region: Secondary | ICD-10-CM | POA: Diagnosis not present

## 2023-02-03 DIAGNOSIS — M5126 Other intervertebral disc displacement, lumbar region: Secondary | ICD-10-CM | POA: Diagnosis not present

## 2023-02-03 DIAGNOSIS — M5416 Radiculopathy, lumbar region: Secondary | ICD-10-CM | POA: Insufficient documentation

## 2023-02-03 NOTE — Telephone Encounter (Signed)
Requested Prescriptions  Pending Prescriptions Disp Refills   lisinopril (ZESTRIL) 10 MG tablet [Pharmacy Med Name: LISINOPRIL 10MG  TABLETS] 90 tablet 0    Sig: TAKE 1 TABLET BY MOUTH DAILY     Cardiovascular:  ACE Inhibitors Passed - 02/01/2023  3:32 AM      Passed - Cr in normal range and within 180 days    Creatinine  Date Value Ref Range Status  02/17/2015 0.88 mg/dL Final    Comment:    0.44-1.00 NOTE: New Reference Range  01/17/15    Creatinine, Ser  Date Value Ref Range Status  12/24/2022 0.67 0.57 - 1.00 mg/dL Final         Passed - K in normal range and within 180 days    Potassium  Date Value Ref Range Status  12/24/2022 4.1 3.5 - 5.2 mmol/L Final  02/17/2015 4.1 mmol/L Final    Comment:    3.5-5.1 NOTE: New Reference Range  01/17/15          Passed - Patient is not pregnant      Passed - Last BP in normal range    BP Readings from Last 1 Encounters:  01/28/23 110/64         Passed - Valid encounter within last 6 months    Recent Outpatient Visits           1 month ago Type 2 diabetes mellitus with hyperglycemia, without long-term current use of insulin ([place])   [place], Dionne Bucy, MD   1 month ago [place], Dionne Bucy, MD   10 months ago Periorbital cellulitis of right eye   [place] Gwyneth Sprout, FNP   10 months ago Generalized anxiety disorder   [place], Dionne Bucy, MD   11 months ago Trigger index finger of right hand   [place], Dionne Bucy, MD       Future Appointments             In 1 month Bacigalupo, Dionne Bucy, MD [place]             sertraline (ZOLOFT) 100 MG tablet [Pharmacy Med Name: SERTRALINE 100MG  TABLETS] 90 tablet 0    Sig: TAKE 1 TABLET(100 MG) BY MOUTH DAILY     Psychiatry:   Antidepressants - SSRI - sertraline Passed - 02/01/2023  3:32 AM      Passed - AST in normal range and within 360 days    AST  Date Value Ref Range Status  12/24/2022 24 0 - 40 IU/L Final   SGOT(AST)  Date Value Ref Range Status  02/17/2015 29 U/L Final    Comment:    15-41 NOTE: New Reference Range  01/17/15          Passed - ALT in normal range and within 360 days    ALT  Date Value Ref Range Status  12/24/2022 23 0 - 32 IU/L Final   SGPT (ALT)  Date Value Ref Range Status  02/17/2015 30 U/L Final    Comment:    14-54 NOTE: New Reference Range  01/17/15          Passed - Completed PHQ-2 or PHQ-9 in the last 360 days      Passed - Valid encounter within last 6 months    Recent Outpatient Visits  1 month ago Type 2 diabetes mellitus with hyperglycemia, without long-term current use of insulin [facility])   [place], Dionne Bucy, MD   1 month ago [place], Dionne Bucy, MD   10 months ago Periorbital cellulitis of right eye   [place] Gwyneth Sprout, FNP   10 months ago Generalized anxiety disorder   [place], Dionne Bucy, MD   11 months ago Trigger index finger of right hand   [place], Dionne Bucy, MD       Future Appointments             In 1 month Bacigalupo, Dionne Bucy, MD [facility], [facility]

## 2023-02-06 NOTE — Progress Notes (Addendum)
Telephone Visit- Progress Note: Referring Physician:  [place]ia Butler, [place] Butler [place],  [place] [address]  Primary Physician:  Cassandra Crews, MD  This visit was performed via telephone.  Patient location: home Provider location: office  I spent a total of 10 minutes non-face-to-face activities for this visit on the date of this encounter including review of current clinical condition and response to treatment.    Patient has given verbal consent to this telephone visits and we reviewed the limitations of a telephone visit. Patient wishes to proceed.    Chief Complaint:  review MRI results  History of Present Illness: Cassandra Butler is a 57 y.o. female has a history of  DM, hyperlipidemia, anxiety, RA, OSA, and breast CA.    Has seen rheumatology for RA.   Last seen by me on 01/28/23 for constant LBP/spasms with legs feeling heavy. She has known lumbar spondylosis from xrays done 02/14/22.   MRI was ordered and phone visit scheduled to review the results.   Her symptoms are the same with no change.   Has seen improvement in her hands with methotrexate but not her back.    Exam: No exam done as this was a telephone encounter.     Imaging: Lumbar MRI dated 02/03/23:  FINDINGS: Segmentation:  Standard.   Alignment:  Physiologic.   Vertebrae: No acute fracture, evidence of discitis, or aggressive bone lesion.   Conus medullaris and cauda equina: Conus extends to the T12-L1 level. Conus and cauda equina appear normal.   Paraspinal and other soft tissues: No acute paraspinal abnormality. Severe left hydroureteronephrosis with severe renal cortical thinning.   Disc levels:   Disc spaces: Disc desiccation at L2-3, L4-5 and to lesser extent L3-4.   T12-L1: No significant disc bulge. No neural foraminal stenosis. No central canal stenosis.   L1-L2: No significant disc bulge. No neural foraminal stenosis. No central canal  stenosis.   L2-L3: Mild broad-based disc bulge. No foraminal or central canal stenosis.   L3-L4: No significant disc bulge. No neural foraminal stenosis. No central canal stenosis.   L4-L5: Broad-based disc bulge with a broad shallow left subarticular disc protrusion contacting the left intraspinal L5 nerve root. Mild bilateral facet arthropathy. No foraminal or central canal stenosis.   L5-S1: Minimal broad-based disc bulge. Moderate right and mild left facet arthropathy. No foraminal stenosis.   IMPRESSION: 1. At L4-5 there is a broad-based disc bulge with a broad shallow left subarticular disc protrusion contacting the left intraspinal L5 nerve root. Mild bilateral facet arthropathy. 2. No acute osseous injury of the lumbar spine. 3. Severe chronic left hydroureteronephrosis.     Electronically Signed   By: Cassandra Butler M.D.   On: 02/05/2023 09:58    I have personally reviewed the images and agree with the above interpretation.  Renal issues are chronic and patient is aware.   Assessment and Plan: [name] is a pleasant 57 y.o. female with constant LBP with  intermittent spasms in her lower back that are worse at night (wakes her up). She has difficulty walking as her legs feel heavy.    She has known lumbar spondylosis from xrays done 02/14/22. She also has known RA.   Above MRI shows disc bulge at L4-L5 that may be contacting L5 nerve. Disc bulge L5-S1 as well as facet hypertrophy L4-S1. No spinal stenosis noted.   Treatment options discussed with patient and following plan made:   - Recommend PT for lumbar  spine. She declines.  - Referral to pain management (Cassandra Butler) to discuss possible lumbar injections.  - Follow up with rheumatology as scheduled.  - Phone visit follow up with me in 6-8 weeks for recheck.   Cassandra Boot PA-C Neurosurgery

## 2023-02-07 ENCOUNTER — Ambulatory Visit (INDEPENDENT_AMBULATORY_CARE_PROVIDER_SITE_OTHER): Payer: BC Managed Care – PPO | Admitting: Orthopedic Surgery

## 2023-02-07 ENCOUNTER — Encounter: Payer: Self-pay | Admitting: Orthopedic Surgery

## 2023-02-07 DIAGNOSIS — M5416 Radiculopathy, lumbar region: Secondary | ICD-10-CM

## 2023-02-07 DIAGNOSIS — M4726 Other spondylosis with radiculopathy, lumbar region: Secondary | ICD-10-CM

## 2023-02-07 DIAGNOSIS — M47816 Spondylosis without myelopathy or radiculopathy, lumbar region: Secondary | ICD-10-CM

## 2023-02-17 ENCOUNTER — Other Ambulatory Visit: Payer: BC Managed Care – PPO | Admitting: Pharmacist

## 2023-02-17 ENCOUNTER — Telehealth: Payer: Self-pay | Admitting: Pharmacist

## 2023-02-17 DIAGNOSIS — E1165 Type 2 diabetes mellitus with hyperglycemia: Secondary | ICD-10-CM

## 2023-02-17 NOTE — Patient Instructions (Signed)
Goals Addressed             This Visit's Progress    Pharmacy Goals       Our goal A1c is less than 7%. This corresponds with fasting sugars less than 130 and 2 hour after meal sugars less than 180. Please restart monitoring your blood sugar, keep a log of your results and have this record to review during our next appointment.  Estelle Grumbles, PharmD, [facility] [phone]

## 2023-02-17 NOTE — Progress Notes (Signed)
Dr. Beryle Flock,   Reached patient briefly today. Denies having started glipizide; says prefers not to related to history of low blood sugar with this med. Patient interested in retrying metformin.  Would you consider prescribing metformin ER 500 mg - to start with 1 tablet daily with supper for 1 week, then increase to 1 tablet twice daily with meals?   Also, can try for approval for Januvia patient assistance through [employer]. May be able to get approved for commercial patient with high deductible plan. If you agree, I will get this process started.  Thank you!   Estelle Grumbles, PharmD, [facility]  [phone]

## 2023-02-17 NOTE — Progress Notes (Signed)
02/17/2023 Name: Cassandra Butler MRN: [id number] DOB: 1966-06-08  Chief Complaint  Patient presents with   Medication Management   Medication Assistance    [name] is a 57 y.o. year old female who presented for a telephone visit.   They were referred to the pharmacist by their PCP for assistance in managing diabetes and medication access.   Subjective:  Care Team: Primary Care Provider: Erasmo Downer, MD ; Next Scheduled Visit: 04/01/2023 Rheumatology: Tracey Harries, MD; Next Scheduled Visit: 02/18/2023 Neurosurgery: Drake Leach, Cordelia Poche; Next Scheduled Visit: 04/03/2023  Medication Access/Adherence  Current Pharmacy:  [facility] [phone] - [place], [place] - [address] AT [facility] [address]  [place] [address] Phone: [phone] Fax: [phone]  [id number] - [place], [place] - [address] AT [facility] OF [address] [facility] [address] [place] [address] Phone: [phone] Fax: [phone]   Patient reports affordability concerns with their medications: Yes  Patient reports access/transportation concerns to their pharmacy: No  Patient reports adherence concerns with their medications:  No     Diabetes:  Current medications: none - From review of chart, note on 01/09/2023 PCP advised patient to start glipizide 5 mg twice daily. However, today patient denies having started glipizide as recalled having had low blood sugar in the past with this medication  Medications tried in the past: glipizide, metformin (GI upset), Ozempic (nausea), Jardiance (cost), Rybelsus (cost)  From review of chart/per patient, she has been unable to start Ozempic, Jardiance or Rybelsus prescriptions as previously ordered by PCP as cost unaffordable through her high deductible insurance coverage  Denies checking home blood sugar recently  Current meal patterns:  - Breakfast: skips and snack cake at  work - Lunch: Sandwich or fast food - Supper: grilled chicken or pork chops and broccoli and cheese or cauliflower - Dessert: pie, cake or banana pudding sometimes - Snacks: peanuts or rice cakes - Drinks: diet [employer] and 2 cups of coffee in morning  Current physical activity: Stays moving on her feet 12 hours throughout the day at work x 5 days/week  Current medication access support: none   Hyperlipidemia/ASCVD Risk Reduction  Current lipid lowering medications: fenofibrate 145 mg daily (started on 12/31/2022) Medications tried in the past: atorvastatin 80 mg daily   Current physical activity: Stays moving on her feet 12 hours throughout the day at work x 5 days/week  Current medication access support: none    Objective:  Lab Results  Component Value Date   HGBA1C 8.6 (H) 12/24/2022    Lab Results  Component Value Date   CREATININE 0.67 12/24/2022   BUN 11 12/24/2022   NA 138 12/24/2022   K 4.1 12/24/2022   CL 101 12/24/2022   CO2 22 12/24/2022    Lab Results  Component Value Date   CHOL 306 (H) 12/24/2022   HDL 29 (L) 12/24/2022   LDLCALC Comment (A) 12/24/2022   LDLDIRECT 37 07/28/2019   TRIG 1,036 (HH) 12/24/2022   CHOLHDL 10.6 (H) 12/24/2022   BP Readings from Last 3 Encounters:  01/28/23 110/64  12/31/22 109/69  12/24/22 118/71   Pulse Readings from Last 3 Encounters:  12/31/22 79  12/24/22 93  04/09/22 90    Medications Reviewed Today     Reviewed by Drake Leach, PA-C (Physician Assistant Certified) on 02/07/23 at [phone]  Med List Status: <None>  Medication Order Taking? Sig Documenting Provider Last Dose Status Informant  fenofibrate (TRICOR) 145 MG tablet [id number]  Take 1 tablet (145 mg total) by mouth daily. Erasmo Downer, MD  Active   folic acid (FOLVITE) 1 MG tablet [id number]  Take 1 mg by mouth daily. [provider]  Active   hydroxychloroquine (PLAQUENIL) 200 MG tablet [id number]  Take 200 mg by mouth daily.  [provider]  Active   lisinopril (ZESTRIL) 10 MG tablet [id number]  TAKE 1 TABLET BY MOUTH DAILY Bacigalupo, Marzella Schlein, MD  Active   methotrexate (RHEUMATREX) 2.5 MG tablet [id number]  Take 15 mg by mouth once a week. [provider]  Active   omeprazole (PRILOSEC) 20 MG capsule [id number] No TAKE 1 CAPSULE(20 MG) BY MOUTH DAILY Pollak, Adriana M, PA-C Taking Active   sertraline (ZOLOFT) 100 MG tablet [id number]  TAKE 1 TABLET(100 MG) BY MOUTH DAILY Bacigalupo, Marzella Schlein, MD  Active               Assessment/Plan:   Unable to complete medication review today; will plant to complete during next telephone visit  Diabetes: - Currently uncontrolled - Reviewed long term cardiovascular and renal outcomes of uncontrolled blood sugar - Reviewed dietary modifications including:  Have regular well-balanced meals and snacks, while controlling carbohydrate portion sizes  Encourage patient to increase intake of non-starchy vegetables  Discuss ideas of lower carbohydrate snacks and meals  Encourage patient to drink water, rather than sugary beverages - Recommend to restart checking blood glucose, keep log of the results and have this record to review during upcoming appointments Counsel patient on strategies to improve comfort with blood sugar checks including using a fresh lancet with each check - Will collaborate with PCP to request provider consider having patient retry metformin ER 500 mg - to start with 1 tablet daily with supper for 1 week, then increase to 1 tablet twice daily with meals - Also, if provider agrees, will collaborate with provider, CPhT, and patient to pursue assistance for Januvia through [employer].   Hyperlipidemia/ASCVD Risk Reduction: - Currently uncontrolled.  - Patient with T2DM and uncontrolled hyperlipidemia not currently not on statin therapy - Plan to assess/discuss hyperlipidemia further during next telephone appointment  Recommend starting patient on  statin therapy   Follow Up Plan: Clinical Pharmacist will follow up with patient by telephone again within the next 7 days.  Estelle Grumbles, PharmD, [facility] [phone]

## 2023-02-18 DIAGNOSIS — M0609 Rheumatoid arthritis without rheumatoid factor, multiple sites: Secondary | ICD-10-CM | POA: Diagnosis not present

## 2023-02-18 DIAGNOSIS — M7711 Lateral epicondylitis, right elbow: Secondary | ICD-10-CM | POA: Diagnosis not present

## 2023-02-18 DIAGNOSIS — Z7960 Long term (current) use of unspecified immunomodulators and immunosuppressants: Secondary | ICD-10-CM | POA: Diagnosis not present

## 2023-02-18 MED ORDER — METFORMIN HCL ER 500 MG PO TB24: ORAL_TABLET | ORAL | 0 refills | Status: DC

## 2023-02-19 ENCOUNTER — Other Ambulatory Visit: Payer: BC Managed Care – PPO | Admitting: Pharmacist

## 2023-02-19 NOTE — Patient Instructions (Signed)
Goals Addressed             This Visit's Progress    Pharmacy Goals       Our goal A1c is less than 7%. This corresponds with fasting sugars less than 130 and 2 hour after meal sugars less than 180. Please restart monitoring your blood sugar, keep a log of your results and have this record to review during our next appointment.   Estelle Grumbles, PharmD, [facility] [phone]

## 2023-02-19 NOTE — Progress Notes (Signed)
02/19/2023 Name: Cassandra Butler MRN: [id number] DOB: 05/07/1966  Chief Complaint  Patient presents with   Medication Management   Medication Assistance    Cassandra Butler is a 57 y.o. year old female who presented for a telephone visit.   They were referred to the pharmacist by their PCP for assistance in managing diabetes, hyperlipidemia, and medication access.    Subjective:  Care Team: Primary Care Provider: Erasmo Downer, MD ; Next Scheduled Visit: 04/01/2023 Rheumatology: Cassandra Harries, MD; Next Scheduled Visit: 04/22/2023 Neurosurgery: Cassandra Butler, Cassandra Butler; Next Scheduled Visit: 04/03/2023  Medication Access/Adherence  Current Pharmacy:  [facility] [phone] - [place], [place] - [address] AT [place] OF [address] [place] [address] Phone: [phone] Fax: [phone]  [id number] - [place], [place] - [address] AT [place] OF [address] [place] [address] Phone: [phone] Fax: [phone]   Patient reports affordability concerns with their medications: Yes  Patient reports access/transportation concerns to their pharmacy: No  Patient reports adherence concerns with their medications:  No       Diabetes:   Current medications: none - Patient planning to pick up and start today: metformin ER 500 mg daily with breakfast for 7 days, then 1 tablet twice daily with meals   Medications tried in the past: glipizide, metformin (GI upset), Ozempic (nausea), Jardiance (cost), Rybelsus (cost)  Denies checking home blood sugar recently. Reports located her old monitor, but needs a new one - Patient requests prescriptions for new blood sugar monitor, test strips and lancets    Reports working on positive dietary changes, including: - Having regular balanced meals throughout the day, including breakfast, while controlling carbohydrate portion sizes - Increasing water  intake in place of diet soda - Reviewing nutrition labels for total carbohydrate content of foods  Reports has support at home and at work with making positive dietary changes    Current physical activity: Stays moving on her feet 12 hours throughout the day at work x 5 days/week   Current medication access support: none - Based on reported income. patient would not qualify for patient assistance for Jardiance from manufacturer - Based on reported income, patient may qualify for patient assistance for Januvia from [facility]andra Butler with PCP and patient who are agree to plan to proceed with application     Hyperlipidemia/ASCVD Risk Reduction   Current lipid lowering medications: fenofibrate 145 mg daily (started on 12/31/2022)      - Reports tolerating well  Medications tried in the past: atorvastatin 80 mg daily; pravastatin  Today reports she is not sure why she stopped taking atorvastatin - thinks that she may just have come off of it when she ran out of refills. Does not recall having any side effects/intolerance   Current physical activity: Stays moving on her feet 12 hours throughout the day at work x 5 days/week   Current medication access support: none   Objective:  Lab Results  Component Value Date   HGBA1C 8.6 (H) 12/24/2022    Lab Results  Component Value Date   CREATININE 0.67 12/24/2022   BUN 11 12/24/2022   NA 138 12/24/2022   K 4.1 12/24/2022   CL 101 12/24/2022   CO2 22 12/24/2022    Lab Results  Component Value Date   CHOL 306 (H) 12/24/2022   HDL 29 (L) 12/24/2022  LDLCALC Comment (A) 12/24/2022   LDLDIRECT 37 07/28/2019   TRIG 1,036 (HH) 12/24/2022   CHOLHDL 10.6 (H) 12/24/2022   BP Readings from Last 3 Encounters:  01/28/23 110/64  12/31/22 109/69  12/24/22 118/71   Pulse Readings from Last 3 Encounters:  12/31/22 79  12/24/22 93  04/09/22 90     Medications Reviewed Today     Reviewed by Cassandra Butler, RPH-CPP (Pharmacist)  on 02/19/23 at 1003  Med List Status: <None>   Medication Order Taking? Sig Documenting Provider Last Dose Status Informant  fenofibrate (TRICOR) 145 MG tablet [id number] Yes Take 1 tablet (145 mg total) by mouth daily. Cassandra Downer, MD Taking Active   folic acid (FOLVITE) 1 MG tablet [id number] Yes Take 1 mg by mouth daily. [provider] Taking Active   hydroxychloroquine (PLAQUENIL) 200 MG tablet [id number] Yes Take 200 mg by mouth daily. [provider] Taking Active   lisinopril (ZESTRIL) 10 MG tablet [id number] Yes TAKE 1 TABLET BY MOUTH DAILY Cassandra Butler, Cassandra Schlein, MD Taking Active   metFORMIN (GLUCOPHAGE-XR) 500 MG 24 hr tablet [id number]  Take 1 tablet (500 mg total) by mouth daily with breakfast for 7 days, THEN 1 tablet (500 mg total) 2 (two) times daily with a meal for 23 days. Cassandra Downer, MD  Active   methotrexate [facility]) 2.5 MG tablet [id number] Yes Take 15 mg by mouth once a week. [provider] Taking Active            Med Note [facility]andra [facility], [employer] [facility] [place] A   Wed Feb 19, 2023 10:03 AM) On Tuesdays  omeprazole (PRILOSEC) 20 MG capsule [id number] Yes TAKE 1 CAPSULE(20 MG) BY MOUTH DAILY Cassandra Sailors, PA-C Taking Active   sertraline (ZOLOFT) 100 MG tablet [id number] Yes TAKE 1 TABLET(100 MG) BY MOUTH DAILY Cassandra Butler, Cassandra Schlein, MD Taking Active               Assessment/Plan:   Comprehensive medication review performed; medication list updated in electronic medical record   Diabetes: - Currently uncontrolled - Reviewed long term cardiovascular and renal outcomes of uncontrolled blood sugar - Encourage patient to continue positive dietary modifications including:             Have regular well-balanced meals and snacks, while controlling carbohydrate portion sizes             Encourage patient to increase intake of non-starchy vegetables             Discuss ideas of lower carbohydrate snacks and meals             Encourage patient to  drink water, rather than sugary beverages - Recommend to restart checking blood glucose, keep log of the results and have this record to review during upcoming appointments - Will collaborate with PCP regarding patient's request for prescriptions for blood sugar monitor, test strips and lancets - Will collaborate with provider, [facility] CPhT, and patient to pursue assistance for Januvia through [employer]     Hyperlipidemia/ASCVD Risk Reduction: - Currently uncontrolled.  - Patient with T2DM and uncontrolled hyperlipidemia not currently not on statin therapy  Will collaborate with PCP to let provider know patient agreeable to restart statin therapy in future - Review dietary modification including patient to continue to review nutrition labels and limit intake of saturated and trans fats, while working on improving blood sugar control - Discuss importance of medication adherence   Follow Up Plan: Clinical Pharmacist will follow up with patient by  telephone on 03/12/2023 at 8:30 AM    Estelle GrumblesElisabeth Ousman Dise, PharmD, [place] [facility] SouthBCACP [facility] [phone]

## 2023-02-20 ENCOUNTER — Telehealth: Payer: Self-pay

## 2023-02-20 DIAGNOSIS — E1165 Type 2 diabetes mellitus with hyperglycemia: Secondary | ICD-10-CM

## 2023-02-20 MED ORDER — ONETOUCH ULTRA 2 W/DEVICE KIT
PACK | 0 refills | Status: AC
Start: 2023-02-20 — End: ?

## 2023-02-20 MED ORDER — ONETOUCH DELICA LANCETS 33G MISC
3 refills | Status: AC
Start: 2023-02-20 — End: ?

## 2023-02-20 MED ORDER — ONETOUCH ULTRA 2 W/DEVICE KIT
PACK | 0 refills | Status: DC
Start: 2023-02-20 — End: 2023-02-20

## 2023-02-20 MED ORDER — ONETOUCH ULTRA VI STRP
ORAL_STRIP | 12 refills | Status: DC
Start: 2023-02-20 — End: 2024-08-08

## 2023-02-20 NOTE — Addendum Note (Signed)
Addended by: Hyacinth Meeker on: 02/20/2023 01:44 PM   Modules accepted: Orders

## 2023-02-20 NOTE — Telephone Encounter (Signed)
Filled out pt portion of [employer] patient assistance form for Januvia, will be mailed out in 3 to 5 business days.

## 2023-02-20 NOTE — Telephone Encounter (Signed)
-----   Message from Manuela Neptune, RPH-CPP sent at 02/19/2023  7:57 PM EDT ----- Would you please assist patient with applying for PAP for Januvia from [employer]? Prescriber is PCP. Note patient with high deductible commercial plan, so suspect patient may need to complete [employer]'s attestation form for approval.  Thank you!  Gentry Fitz

## 2023-02-20 NOTE — Telephone Encounter (Signed)
Copied from CRM [id number]. Topic: General - Other >> Feb 20, 2023  9:32 AM Franchot Heidelberg wrote: Reason for CRM: Amani from [facility] in [place] called to report that they need more definitive directions for the blood glucose monitoring device kit instead of just "use as directed" please advise

## 2023-02-20 NOTE — Telephone Encounter (Signed)
Please update Rx to say test blood glucose once daily. Thanks!

## 2023-03-12 ENCOUNTER — Other Ambulatory Visit: Payer: BC Managed Care – PPO | Admitting: Pharmacist

## 2023-03-12 NOTE — Progress Notes (Unsigned)
03/12/2023 Name: Cassandra Butler MRN: [id number] DOB: 09/13/1966  Chief Complaint  Patient presents with   Medication Management   Medication Assistance    Cassandra Butler is a 57 y.o. year old female who presented for a telephone visit.   They were referred to the pharmacist by their PCP for assistance in managing diabetes, hyperlipidemia, and medication access.    Subjective:  Care Team: Primary Care Provider: Erasmo Downer, MD ; Next Scheduled Visit: 04/01/2023 Rheumatology: Tracey Harries, MD; Next Scheduled Visit: 04/22/2023 Neurosurgery: Drake Leach, Cordelia Poche; Next Scheduled Visit: 04/03/2023  Medication Access/Adherence  Current Pharmacy:  [facility] [phone] - [place], [place] - [address] AT [facility] OF [address] [place] [address] Phone: [phone] Fax: [phone]  [facility] #[id number] - [place], [place] - [address] AT [facility] OF [address] [place] [address] Phone: [phone] Fax: [phone]   Patient reports affordability concerns with their medications: Yes  Patient reports access/transportation concerns to their pharmacy: No  Patient reports adherence concerns with their medications:  No       Diabetes:   Current medications: metformin ER 500 mg daily with supper - reports sometimes has mild diarrhea, but otherwise tolerating. Has not tried increasing dose to twice daily due to diarrhea on current dose   Medications tried in the past: glipizide, metformin (GI upset), Ozempic (nausea), Jardiance (cost), Rybelsus (cost)   Reports started checking home blood sugar. 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  Reports working on weight loss, current weight ~158 lbs  Reports tried to pick up new glucometer from pharmacy, but that [facility] advised One Touch not preferred through her plan   Current medication access support: none - Had previously collaborated with PCP and [facility] CPh[facility] regarding aiding patient with applying for patient assistance for Januvia through [employer] Patient reports received [employer] PAP application, but has not completed it because her insurance will be changing with her new position (expects new benefits to start in ~60 days)     Hyperlipidemia/ASCVD Risk Reduction   Current lipid lowering medications: fenofibrate 145 mg daily (started on 12/31/2022)                                                             - Reports tolerating well   Medications tried in the past: atorvastatin 80 mg daily; pravastatin   Patient has reported not sure why she stopped atorvastatin in the past. Does not recall having any side effects/intolerance. 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   Current medication access support: none   Objective:  Lab Results  Component Value Date   HGBA1C 8.6 (  H) 12/24/2022    Lab Results  Component Value Date   CREATININE 0.67 12/24/2022   BUN 11 12/24/2022   NA 138 12/24/2022   K 4.1 12/24/2022   CL 101 12/24/2022   CO2 22 12/24/2022    Lab Results  Component Value Date   CHOL 306 (H) 12/24/2022   HDL 29 (L) 12/24/2022   LDLCALC Comment (A) 12/24/2022   LDLDIRECT 37 07/28/2019   TRIG 1,036 (HH) 12/24/2022   CHOLHDL 10.6 (H) 12/24/2022   BP Readings from Last 3 Encounters:  01/28/23 110/64   12/31/22 109/69  12/24/22 118/71   Pulse Readings from Last 3 Encounters:  12/31/22 79  12/24/22 93  04/09/22 90     Medications Reviewed Today     Reviewed by Manuela Neptune, RPH-CPP (Pharmacist) on 02/19/23 at 1003  Med List Status: <None>   Medication Order Taking? Sig Documenting Provider Last Dose Status Informant  fenofibrate (TRICOR) 145 MG tablet [id number] Yes Take 1 tablet (145 mg total) by mouth daily. Erasmo Downer, MD Taking Active   folic acid (FOLVITE) 1 MG tablet [id number] Yes Take 1 mg by mouth daily. [provider] Taking Active   hydroxychloroquine (PLAQUENIL) 200 MG tablet [id number] Yes Take 200 mg by mouth daily. [provider] Taking Active   lisinopril (ZESTRIL) 10 MG tablet [id number] Yes TAKE 1 TABLET BY MOUTH DAILY Bacigalupo, Marzella Schlein, MD Taking Active   metFORMIN (GLUCOPHAGE-XR) 500 MG 24 hr tablet [id number]  Take 1 tablet (500 mg total) by mouth daily with breakfast for 7 days, THEN 1 tablet (500 mg total) 2 (two) times daily with a meal for 23 days. Erasmo Downer, MD  Active   methotrexate [facility]) 2.5 MG tablet [id number] Yes Take 15 mg by mouth once a week. [provider] Taking Active            Med Note Ronney Asters, [facility] A   Wed Feb 19, 2023 10:03 AM) On Tuesdays  omeprazole (PRILOSEC) 20 MG capsule [id number] Yes TAKE 1 CAPSULE(20 MG) BY MOUTH DAILY Trey Sailors, PA-C Taking Active   sertraline (ZOLOFT) 100 MG tablet [id number] Yes TAKE 1 TABLET(100 MG) BY MOUTH DAILY Bacigalupo, Marzella Schlein, MD Taking Active               Assessment/Plan:    Diabetes: - Currently uncontrolled - Reviewed long term cardiovascular and renal outcomes of uncontrolled blood sugar - Encourage patient to continue positive dietary modifications including:             Have regular well-balanced meals and snacks, while controlling carbohydrate portion sizes             Encourage patient to increase intake of  non-starchy vegetables             Discuss ideas of lower carbohydrate snacks and meals             Encourage patient to drink water, rather than sugary beverages - Encourage patient to restart walking, as able, to target 150 minutes of exercise/week - Recommend to check blood glucose, keep log of the results and have this record to review during upcoming appointments - Outreach to [employer] on behalf of patient. Discuss with pharmacy to find lowest cost meter through patient's plan - Accu-Chek Guide. Pharmacy will get these prescriptions ready for patient  Follow up with patient to let her know - Will plan to discuss medication assistance/cost options further during our next call (once patient has  more information about her new health plan)     Hyperlipidemia/ASCVD Risk Reduction: - Currently uncontrolled.  - Patient with T2DM and uncontrolled hyperlipidemia not currently not on statin therapy             Collaborated with PCP to let provider know patient agreeable to restart statin therapy in future - Review dietary modification including patient to continue to review nutrition labels and limit intake of saturated and trans fats, while working on improving blood sugar control - Encourage patient to restart walking, as able, to target 150 minutes of exercise/week     Follow Up Plan: Clinical Pharmacist will follow up with patient by telephone on 04/30/2023 at 8:30 AM    Estelle Grumbles, PharmD, [facility] [phone]

## 2023-03-13 NOTE — Patient Instructions (Signed)
Goals Addressed             This Visit's Progress    Pharmacy Goals       Our goal A1c is less than 7%. This corresponds with fasting sugars less than 130 and 2 hour after meal sugars less than 180. Please restart monitoring your blood sugar, keep a log of your results and have this record to review during our next appointment.   Laurann Mcmorris Kimmi Acocella, PharmD, BCACP [facility] [phone]         

## 2023-03-18 ENCOUNTER — Other Ambulatory Visit: Payer: Self-pay | Admitting: Family Medicine

## 2023-03-19 LAB — HM DIABETES EYE EXAM

## 2023-03-20 ENCOUNTER — Ambulatory Visit: Payer: Self-pay | Admitting: *Deleted

## 2023-03-20 NOTE — Telephone Encounter (Signed)
Patient advised as below. She reports that [facility] has referred her a specialist in [place]. She has an appt 04/18/23. She has also received a call from rheumatology and they advised her to stop one medication. However, she does not remember which medication. I advised her to call them back and ask them. Also advised her to call [facility] if her eye sight changes to see if they can move her appt sooner with specialist, or go to ED.

## 2023-03-20 NOTE — Telephone Encounter (Signed)
Hydroxychloroquine (Plaquenil) can cause vision disturbances and bleeding problems in the eye.  Diabetes can also.  The eye doctor usually has a sense of which one is the culprit as they have different patterns.  I would defer to the eye doctor (make sure it is an ophthalmologist, not optometrist) and Rheumatology that prescribes the medication.

## 2023-03-20 NOTE — Telephone Encounter (Signed)
  Summary: Pt informed that she has some bleeding in the back of her eye   Pt reports that she had an eye exam and she was notified that she has some bleeding in the back of her eye. Pt concerned that the medication prescribed for arthritis could be causing it. Pt requests call back to discuss stopping the medication. Cb# [phone]           Chief Complaint: information only regarding bleeding behind eyes s/p eye exam yesterday 03/19/23 Symptoms: has noted pain behind eyes since starting medication for arthritis, methotrexate and hydroxychlorquine. Took last dose of methotrexate Tuesday 03/18/23 and behind eyes started hurting again. Has episodes of "snowy" vision last few seconds and goes away.  Frequency: since starting arthritis meds 01/07/23 approx 12 weeks ago  Pertinent Negatives: Patient denies pain now no loss of vision.  Disposition: [] ED /[] Urgent Care (no appt availability in office) / [] Appointment(In office/virtual)/ []  [facility]/ [] Home Care/ [] Refused Recommended Disposition /[] [facility]/ [x]  Follow-up with PCP Additional Notes:   Please advise if OV needed. Last see at [facility]  in [place] yesterday and now has appt with eye specialist . Please advise if patient should continue taking arthritis medications. Has asked Dr. Allena Katz and reports he feels bleeding behind eyes due to diabetes. Please advise     Reason for Disposition  [1] Eye pain AND [2] brief (now gone) blurred vision or visual changes  Answer Assessment - Initial Assessment Questions 1. DESCRIPTION: "How has your vision changed?" (e.g., complete vision loss, blurred vision, double vision, floaters, etc.)     Back of eyes hurt had eye exam yesterday and reports bleeding noted behind eyes 2. LOCATION: "One or both eyes?" If one, ask: "Which eye?"     Both  3. SEVERITY: "Can you see anything?" If Yes, ask: "What can you see?" (e.g., fine print)     Can see but at times noted  vision that is "snowy" looking and lasts few seconds and goes away  4. ONSET: "When did this begin?" "Did it start suddenly or has this been gradual?"     After starting medication for arthritis 5. PATTERN: "Does this come and go, or has it been constant since it started?"     Comes and goes 6. PAIN: "Is there any pain in your eye(s)?"  (Scale 1-10; or mild, moderate, severe)   - NONE (0): No pain.   - MILD (1-3): Doesn't interfere with normal activities.   - MODERATE (4-7): Interferes with normal activities or awakens from sleep.    - SEVERE (8-10): Excruciating pain, unable to do any normal activities.     ,yes ,  can function but sees "like snow" at times. Not often eyes sore in back  7. CONTACTS-GLASSES: "Do you wear contacts or glasses?"     Readers at times 8. CAUSE: "What do you think is causing this visual problem?"     Bleeding behind eyes per eye DR.  9. OTHER SYMPTOMS: "Do you have any other symptoms?" (e.g., confusion, headache, arm or leg weakness, speech problems)     Pain behind eyes, snowy vision at times.  10. PREGNANCY: "Is there any chance you are pregnant?" "When was your last menstrual period?"       na  Protocols used: Vision Loss or Change-A-AH

## 2023-03-24 ENCOUNTER — Ambulatory Visit: Payer: BC Managed Care – PPO | Admitting: Student in an Organized Health Care Education/Training Program

## 2023-04-01 ENCOUNTER — Ambulatory Visit (INDEPENDENT_AMBULATORY_CARE_PROVIDER_SITE_OTHER): Payer: Self-pay | Admitting: Family Medicine

## 2023-04-01 ENCOUNTER — Encounter: Payer: Self-pay | Admitting: Family Medicine

## 2023-04-01 VITALS — BP 99/64 | HR 94 | Temp 97.8°F | Resp 12 | Wt 159.0 lb

## 2023-04-01 DIAGNOSIS — E1165 Type 2 diabetes mellitus with hyperglycemia: Secondary | ICD-10-CM

## 2023-04-01 DIAGNOSIS — I152 Hypertension secondary to endocrine disorders: Secondary | ICD-10-CM

## 2023-04-01 DIAGNOSIS — E1159 Type 2 diabetes mellitus with other circulatory complications: Secondary | ICD-10-CM

## 2023-04-01 DIAGNOSIS — G4733 Obstructive sleep apnea (adult) (pediatric): Secondary | ICD-10-CM

## 2023-04-01 DIAGNOSIS — E1169 Type 2 diabetes mellitus with other specified complication: Secondary | ICD-10-CM

## 2023-04-01 DIAGNOSIS — E785 Hyperlipidemia, unspecified: Secondary | ICD-10-CM

## 2023-04-01 LAB — POCT GLYCOSYLATED HEMOGLOBIN (HGB A1C)
Est. average glucose Bld gHb Est-mCnc: 157
Hemoglobin A1C: 7.1 % — AB (ref 4.0–5.6)

## 2023-04-01 NOTE — Assessment & Plan Note (Signed)
A1c slightly elevated at 7.1, but dexcom readings show great control Continue metformin daily with supper UACR today F/u in 3 m and recheck A1c

## 2023-04-01 NOTE — Assessment & Plan Note (Signed)
Well controlled - low normal but asymptomatic Continue current medications Reviewed metabolic panel F/u in 6 months

## 2023-04-01 NOTE — Progress Notes (Signed)
I,Sulibeya S Dimas,acting as a Neurosurgeon for Shirlee Latch, MD.,have documented all relevant documentation on the behalf of Shirlee Latch, MD,as directed by  Shirlee Latch, MD while in the presence of Shirlee Latch, MD.     Established patient visit   Patient: Cassandra Butler   DOB: [birth year]   56 y.o. Female  MRN: [id number] Visit Date: 04/01/2023  Today's healthcare provider: Shirlee Latch, MD   Chief Complaint  Patient presents with   Diabetes   Hypertension   Hyperlipidemia   Subjective    HPI  Diabetes Mellitus Type II, follow-up  Lab Results  Component Value Date   HGBA1C 7.1 (A) 04/01/2023   HGBA1C 8.6 (H) 12/24/2022   HGBA1C 11.1 (A) 03/19/2022   Last seen for diabetes 3 months ago.  Management since then includes start Jardiance 25 mg daily. Patient was not able to afford Jardiance. Patient is now taking metformin ER 500 mg daily with supper.  She reports excellent compliance with treatment. She is not having side effects.   Home blood sugar records: fasting range: 90  Episodes of hypoglycemia? No    Current insulin regiment: none Most Recent Eye Exam: UTD  Using dexcom and BG is 71-123  --------------------------------------------------------------------------------------------------- Hypertension, follow-up  BP Readings from Last 3 Encounters:  04/01/23 99/64  01/28/23 110/64  12/31/22 109/69   Wt Readings from Last 3 Encounters:  04/01/23 159 lb (72.1 kg)  01/28/23 164 lb (74.4 kg)  12/31/22 164 lb (74.4 kg)     She was last seen for hypertension 3 months ago.  BP at that visit was 110/64. Management since that visit includes no changes. She reports excellent compliance with treatment. She is not having side effects.   Outside blood pressures are not being checked. --------------------------------------------------------------------------------------------------- Lipid/Cholesterol, follow-up  Last Lipid Panel: Lab  Results  Component Value Date   CHOL 306 (H) 12/24/2022   LDLCALC Comment (A) 12/24/2022   LDLDIRECT 37 07/28/2019   HDL 29 (L) 12/24/2022   TRIG 1,036 (HH) 12/24/2022    She was last seen for this 3 months ago.  Management since that visit includes no changes.  She reports excellent compliance with treatment. She is not having side effects.   Last metabolic panel Lab Results  Component Value Date   GLUCOSE 177 (H) 12/24/2022   NA 138 12/24/2022   K 4.1 12/24/2022   BUN 11 12/24/2022   CREATININE 0.67 12/24/2022   EGFR 103 12/24/2022   GFRNONAA 56 (L) 07/02/2021   CALCIUM 9.6 12/24/2022   AST 24 12/24/2022   ALT 23 12/24/2022   The 10-year ASCVD risk score (Arnett DK, et al., 2019) is: 20%  ---------------------------------------------------------------------------------------------------   Medications: Outpatient Medications Prior to Visit  Medication Sig   Blood Glucose Monitoring Suppl (ONE TOUCH ULTRA 2) w/Device KIT Test blood glucose once daily.   fenofibrate (TRICOR) 145 MG tablet Take 1 tablet (145 mg total) by mouth daily.   folic acid (FOLVITE) 1 MG tablet Take 1 mg by mouth daily.   glucose blood (ONETOUCH ULTRA) test strip Use as instructed   lisinopril (ZESTRIL) 10 MG tablet TAKE 1 TABLET BY MOUTH DAILY   metFORMIN (GLUCOPHAGE-XR) 500 MG 24 hr tablet Take 1 tablet (500 mg total) by mouth daily with supper.   methotrexate (RHEUMATREX) 2.5 MG tablet Take 15 mg by mouth once a week.   omeprazole (PRILOSEC) 20 MG capsule TAKE 1 CAPSULE(20 MG) BY MOUTH DAILY   OneTouch Delica Lancets 33G MISC Use as directed  sertraline (ZOLOFT) 100 MG tablet TAKE 1 TABLET(100 MG) BY MOUTH DAILY   [DISCONTINUED] hydroxychloroquine (PLAQUENIL) 200 MG tablet Take 200 mg by mouth daily. (Patient not taking: Reported on 04/01/2023)   No facility-administered medications prior to visit.    Review of Systems  Constitutional:  Negative for appetite change and fatigue.  Eyes:   Positive for visual disturbance.  Respiratory:  Negative for cough, chest tightness and shortness of breath.   Cardiovascular:  Negative for chest pain and leg swelling.  Gastrointestinal:  Negative for abdominal pain, nausea and vomiting.  Neurological:  Positive for weakness and numbness. Negative for dizziness, light-headedness and headaches.       Objective    BP 99/64 (BP Location: Left Arm, Patient Position: Sitting, Cuff Size: Normal)   Pulse 94   Temp 97.8 F (36.6 C) (Temporal)   Resp 12   Wt 159 lb (72.1 kg)   BMI 25.66 kg/m  BP Readings from Last 3 Encounters:  04/01/23 99/64  01/28/23 110/64  12/31/22 109/69   Wt Readings from Last 3 Encounters:  04/01/23 159 lb (72.1 kg)  01/28/23 164 lb (74.4 kg)  12/31/22 164 lb (74.4 kg)      Physical Exam Vitals reviewed.  Constitutional:      General: She is not in acute distress.    Appearance: Normal appearance. She is well-developed. She is not diaphoretic.  HENT:     Head: Normocephalic and atraumatic.  Eyes:     General: No scleral icterus.    Conjunctiva/sclera: Conjunctivae normal.  Neck:     Thyroid: No thyromegaly.  Cardiovascular:     Rate and Rhythm: Normal rate and regular rhythm.     Heart sounds: Normal heart sounds. No murmur heard. Pulmonary:     Effort: Pulmonary effort is normal. No respiratory distress.     Breath sounds: Normal breath sounds. No wheezing, rhonchi or rales.  Musculoskeletal:     Cervical back: Neck supple.     Right lower leg: No edema.     Left lower leg: No edema.  Lymphadenopathy:     Cervical: No cervical adenopathy.  Skin:    General: Skin is warm and dry.     Findings: No rash.  Neurological:     Mental Status: She is alert and oriented to person, place, and time. Mental status is at baseline.  Psychiatric:        Mood and Affect: Mood normal.        Behavior: Behavior normal.       Results for orders placed or performed in visit on 04/01/23  POCT  glycosylated hemoglobin (Hb A1C)  Result Value Ref Range   Hemoglobin A1C 7.1 (A) 4.0 - 5.6 %   Est. average glucose Bld gHb Est-mCnc 157     Assessment & Plan     Problem List Items Addressed This Visit       Cardiovascular and Mediastinum   Hypertension associated with diabetes (HCC)    Well controlled - low normal but asymptomatic Continue current medications Reviewed metabolic panel F/u in 6 months         Respiratory   Obstructive sleep apnea of adult     Endocrine   Hyperlipidemia associated with type 2 diabetes mellitus (HCC)    Reviewed last lipid panel Likely needs statin due to diabetes Check direct LDL with next labs      Type 2 diabetes mellitus with hyperglycemia (HCC) - Primary    A1c slightly elevated at  7.1, but dexcom readings show great control Continue metformin daily with supper UACR today F/u in 3 m and recheck A1c      Relevant Orders   POCT glycosylated hemoglobin (Hb A1C) (Completed)   Urine Microalbumin w/creat. ratio     Return in about 3 months (around 07/02/2023) for CPE.      I, Shirlee Latch, MD, have reviewed all documentation for this visit. The documentation on 04/01/23 for the exam, diagnosis, procedures, and orders are all accurate and complete.   Angelik Walls, Marzella Schlein, MD, MPH [facility]

## 2023-04-01 NOTE — Assessment & Plan Note (Signed)
Reviewed last lipid panel Likely needs statin due to diabetes Check direct LDL with next labs 

## 2023-04-01 NOTE — Progress Notes (Unsigned)
   Telephone Visit- Progress Note: Referring Physician:  Erasmo Downer, MD [address] [place],  [place] [address]  Primary Physician:  Erasmo Downer, MD  This visit was performed via telephone.  Patient location: home Provider location: office  I spent a total of 5 minutes non-face-to-face activities for this visit on the date of this encounter including review of current clinical condition and response to treatment.    Patient has given verbal consent to this telephone visits and we reviewed the limitations of a telephone visit. Patient wishes to proceed.    Chief Complaint:  recheck visit  History of Present Illness: DEMESHA OBERLANDER is a 57 y.o. female has a history of  DM, hyperlipidemia, anxiety, RA, OSA, and breast CA.    Has seen rheumatology for RA.   She had phone visit on 02/07/23 to review her lumbar MRI. She has known disc bulge at L4-L5 that may be contacting L5 nerve. Disc bulge L5-S1 as well as facet hypertrophy L4-S1. No spinal stenosis noted.   PT recommended and she declined. She was referred to pain management Cherylann Ratel) to discuss possible lumbar injections. She has not seen him yet.   Phone visit scheduled for follow up.   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 She has no current back pain or spasms! No pain in her legs. No feelings of heaviness or weakness. She is doing great! She did not schedule her appointment with pain management as she had no pain.   She thinks switching jobs helped. May also be seeing some relief from methorexate.   Exam: No exam done as this was a telephone encounter.     Imaging: None   Assessment and Plan: Ms. Bazzle is a pleasant 57 y.o. female with no current LBP or spasms. No feeling os heaviness in her legs. She feels great!   She has known disc bulge at L4-L5 that may be contacting L5 nerve. Disc bulge L5-S1 as well as facet hypertrophy L4-S1. No spinal stenosis noted.    Treatment options discussed with patient and following plan made:   - Hold on further treatment as she is doing great.  - Can always revisit PT if pain gets worse.  - She will continue methotrexate from rheumatology and follow with them as scheduled.  - She will follow up with me prn.    Drake Leach PA-C Neurosurgery

## 2023-04-02 LAB — MICROALBUMIN / CREATININE URINE RATIO
Creatinine, Urine: 40.2 mg/dL
Microalb/Creat Ratio: <7 mg/g creat (ref 0–29)
Microalbumin, Urine: <3 ug/mL

## 2023-04-03 ENCOUNTER — Ambulatory Visit (INDEPENDENT_AMBULATORY_CARE_PROVIDER_SITE_OTHER): Payer: BC Managed Care – PPO | Admitting: Orthopedic Surgery

## 2023-04-03 ENCOUNTER — Encounter: Payer: Self-pay | Admitting: Family Medicine

## 2023-04-03 ENCOUNTER — Encounter: Payer: Self-pay | Admitting: Orthopedic Surgery

## 2023-04-03 DIAGNOSIS — M47816 Spondylosis without myelopathy or radiculopathy, lumbar region: Secondary | ICD-10-CM

## 2023-04-30 ENCOUNTER — Telehealth: Payer: Self-pay | Admitting: Pharmacist

## 2023-04-30 ENCOUNTER — Other Ambulatory Visit: Payer: BC Managed Care – PPO | Admitting: Pharmacist

## 2023-04-30 NOTE — Progress Notes (Signed)
   Outreach Note  04/30/2023 Name: KAYNA XIE MRN: [id number] DOB: November 30, 1965  Referred by: Erasmo Downer, MD Reason for referral : No chief complaint on file.   Was unable to reach patient via telephone today and have left HIPAA compliant voicemail asking patient to return my call.   Follow Up Plan: Will collaborate with Care Guide to outreach to schedule follow up with me  Estelle Grumbles, PharmD, [facility] Clinical Pharmacist [facility] [phone]

## 2023-05-01 ENCOUNTER — Other Ambulatory Visit: Payer: Self-pay | Admitting: Family Medicine

## 2023-05-01 DIAGNOSIS — F411 Generalized anxiety disorder: Secondary | ICD-10-CM

## 2023-05-01 DIAGNOSIS — I10 Essential (primary) hypertension: Secondary | ICD-10-CM

## 2023-05-14 ENCOUNTER — Ambulatory Visit (INDEPENDENT_AMBULATORY_CARE_PROVIDER_SITE_OTHER): Payer: PRIVATE HEALTH INSURANCE | Admitting: Family Medicine

## 2023-05-14 ENCOUNTER — Telehealth: Payer: Self-pay

## 2023-05-14 ENCOUNTER — Ambulatory Visit: Payer: Self-pay

## 2023-05-14 ENCOUNTER — Encounter: Payer: Self-pay | Admitting: Family Medicine

## 2023-05-14 VITALS — BP 86/55 | HR 86 | Temp 97.8°F | Resp 16 | Ht 66.0 in | Wt 157.3 lb

## 2023-05-14 DIAGNOSIS — I10 Essential (primary) hypertension: Secondary | ICD-10-CM | POA: Diagnosis not present

## 2023-05-14 DIAGNOSIS — I959 Hypotension, unspecified: Secondary | ICD-10-CM

## 2023-05-14 DIAGNOSIS — R300 Dysuria: Secondary | ICD-10-CM

## 2023-05-14 LAB — POCT URINALYSIS DIPSTICK
Bilirubin, UA: NEGATIVE
Glucose, UA: NEGATIVE
Ketones, UA: NEGATIVE
Nitrite, UA: NEGATIVE
Protein, UA: POSITIVE — AB
Spec Grav, UA: 1.02 (ref 1.010–1.025)
Urobilinogen, UA: 0.2 E.U./dL
pH, UA: 6 (ref 5.0–8.0)

## 2023-05-14 MED ORDER — CEPHALEXIN 500 MG PO CAPS: 500.0000 mg | ORAL_CAPSULE | Freq: Three times a day (TID) | ORAL | 0 refills | Status: AC

## 2023-05-14 MED ORDER — LISINOPRIL 10 MG PO TABS: 5.0000 mg | ORAL_TABLET | Freq: Every day | ORAL | 0 refills | Status: DC

## 2023-05-14 NOTE — Progress Notes (Signed)
Established patient visit   Patient: Cassandra Butler   DOB: 1966-10-26   56 y.o. Female  MRN: [id number] Visit Date: 05/14/2023  Today's healthcare provider: Ronnald Ramp, MD   Chief Complaint  Patient presents with   Urinary Tract Infection   Subjective      Urinary Tract Infection: Patient complains of burning with urination and right flank pain She has had symptoms for 3 days. Patient also complains of back pain. Patient denies fever, stomach ache, and vaginal discharge. Patient does not have a history of recurrent UTI.  Patient does not have a history of pyelonephritis.    Medications: Outpatient Medications Prior to Visit  Medication Sig   Blood Glucose Monitoring Suppl (ONE TOUCH ULTRA 2) w/Device KIT Test blood glucose once daily.   fenofibrate (TRICOR) 145 MG tablet Take 1 tablet (145 mg total) by mouth daily.   folic acid (FOLVITE) 1 MG tablet Take 1 mg by mouth daily.   glucose blood (ONETOUCH ULTRA) test strip Use as instructed   metFORMIN (GLUCOPHAGE-XR) 500 MG 24 hr tablet Take 1 tablet (500 mg total) by mouth daily with supper.   methotrexate (RHEUMATREX) 2.5 MG tablet Take 15 mg by mouth once a week.   omeprazole (PRILOSEC) 20 MG capsule TAKE 1 CAPSULE(20 MG) BY MOUTH DAILY   OneTouch Delica Lancets 33G MISC Use as directed   sertraline (ZOLOFT) 100 MG tablet TAKE 1 TABLET(100 MG) BY MOUTH DAILY   [DISCONTINUED] lisinopril (ZESTRIL) 10 MG tablet TAKE 1 TABLET BY MOUTH DAILY   No facility-administered medications prior to visit.    Review of Systems     Objective    BP (!) 86/55 (BP Location: Left Arm, Patient Position: Sitting, Cuff Size: Large)   Pulse 86   Temp 97.8 F (36.6 C) (Temporal)   Resp 16   Ht 5\' 6"  (1.676 m)   Wt 157 lb 4.8 oz (71.4 kg)   SpO2 99%   BMI 25.39 kg/m  BP Readings from Last 3 Encounters:  05/14/23 (!) 86/55  04/01/23 99/64  01/28/23 110/64   Wt Readings from Last 3 Encounters:  05/14/23 157 lb 4.8 oz  (71.4 kg)  04/01/23 159 lb (72.1 kg)  01/28/23 164 lb (74.4 kg)      Physical Exam Vitals reviewed.  Constitutional:      General: She is not in acute distress.    Appearance: Normal appearance. She is not ill-appearing, toxic-appearing or diaphoretic.  Eyes:     Conjunctiva/sclera: Conjunctivae normal.  Cardiovascular:     Rate and Rhythm: Normal rate and regular rhythm.     Pulses: Normal pulses.     Heart sounds: Normal heart sounds. No murmur heard.    No friction rub. No gallop.  Pulmonary:     Effort: Pulmonary effort is normal. No respiratory distress.     Breath sounds: Normal breath sounds. No stridor. No wheezing, rhonchi or rales.  Abdominal:     General: Bowel sounds are normal. There is no distension.     Palpations: Abdomen is soft.     Tenderness: There is abdominal tenderness in the suprapubic area. There is no right CVA tenderness.  Musculoskeletal:     Right lower leg: No edema.     Left lower leg: No edema.  Skin:    Findings: No erythema or rash.  Neurological:     Mental Status: She is alert and oriented to person, place, and time.      Results for  orders placed or performed in visit on 05/14/23  POCT Urinalysis Dipstick  Result Value Ref Range   Color, UA yellow    Clarity, UA dark    Glucose, UA Negative Negative   Bilirubin, UA Negative    Ketones, UA Negative    Spec Grav, UA 1.020 1.010 - 1.025   Blood, UA Large    pH, UA 6.0 5.0 - 8.0   Protein, UA Positive (A) Negative   Urobilinogen, UA 0.2 0.2 or 1.0 E.U./dL   Nitrite, UA Negative    Leukocytes, UA Moderate (2+) (A) Negative    Assessment & Plan     Problem List Items Addressed This Visit     Essential (primary) hypertension - Primary    Chronic problem Patient denies previous issues with her blood pressure being elevated Reports that she has been having some lightheaded dizziness that has been going on longer than her UTI symptoms Recommended that she decrease lisinopril from  10 mg daily to 5 mg daily Offered to write new prescription for 2.5 mg daily of lisinopril, patient would like to try cutting her current 10 mg tablets in half first Patient to follow-up if hypotension persists       Relevant Medications   lisinopril (ZESTRIL) 10 MG tablet   Other Relevant Orders   Comprehensive metabolic panel   RESOLVED: Dysuria   Relevant Orders   POCT Urinalysis Dipstick (Completed)   Urine Culture   Comprehensive metabolic panel   Other Visit Diagnoses     Hypotensive episode       Relevant Medications   lisinopril (ZESTRIL) 10 MG tablet   Other Relevant Orders   Comprehensive metabolic panel       2. Dysuria Acute Patient with no history of frequent urinary tract infections We will obtain urine culture Urine analysis notable for white blood cells and protein in the urine Recommended creatinine check with CMP, CMP ordered Will treat with Keflex 500 mg 3 times daily for 7 days Counseled patient to return to care or seek urgent care if office unavailable for high fevers, worsening flank/back pain or inability to tolerate oral fluids/medication - POCT Urinalysis Dipstick - Urine Culture - Comprehensive metabolic panel    Return in about 10 days (around 05/24/2023), or if symptoms worsen or fail to improve.      Ronnald Ramp, MD  [facility] [phone] (phone) [phone] (fax)  [facility]

## 2023-05-14 NOTE — Assessment & Plan Note (Signed)
Chronic problem Patient denies previous issues with her blood pressure being elevated Reports that she has been having some lightheaded dizziness that has been going on longer than her UTI symptoms Recommended that she decrease lisinopril from 10 mg daily to 5 mg daily Offered to write new prescription for 2.5 mg daily of lisinopril, patient would like to try cutting her current 10 mg tablets in half first Patient to follow-up if hypotension persists

## 2023-05-14 NOTE — Assessment & Plan Note (Deleted)
Acute Patient with no history of frequent urinary tract infections We will obtain urine culture Urine analysis notable for white blood cells and protein in the urine Recommended creatinine check with CMP, CMP ordered Will treat with Keflex 500 mg 3 times daily for 7 days Counseled patient to return to care or seek urgent care if office unavailable for high fevers, worsening flank/back pain or inability to tolerate oral fluids/medication

## 2023-05-14 NOTE — Patient Instructions (Addendum)
Start taking 1/2 tab of lisinopril due to low blood pressure   Goal is for your blood pressure to be between 100/60 & 130/80  I have sent in a prescription for Keflex 500mg  three times daily for the next 7 days  Please let us know if your symptoms worsen despite the medication

## 2023-05-14 NOTE — Telephone Encounter (Signed)
  Chief Complaint: buring with urination  Symptoms: right flank pain Frequency: 3 days Pertinent Negatives: Patient denies fever, abd pain  Disposition: [] ED /[] Urgent Care (no appt availability in office) / [x] Appointment(In office/virtual)/ []  [facility]/ [] Home Care/ [] Refused Recommended Disposition /[] [facility]/ []  Follow-up with PCP Additional Notes: called [facility] if any openings and was advised that Dr B not inoffice and they are going to open up more appts- Offered pt at 1420 app. Reason for Disposition  Side (flank) or lower back pain present  Answer Assessment - Initial Assessment Questions 1. SEVERITY: "How bad is the pain?"  (e.g., Scale 1-10; mild, moderate, or severe)   - MILD (1-3): complains slightly about urination hurting   - MODERATE (4-7): interferes with normal activities     - SEVERE (8-10): excruciating, unwilling or unable to urinate because of the pain      moderate 3. PATTERN: "Is pain present every time you urinate or just sometimes?"      yes 4. ONSET: "When did the painful urination start?"      3 days ago  5. FEVER: "Do you have a fever?" If Yes, ask: "What is your temperature, how was it measured, and when did it start?"     no 6. PAST UTI: "Have you had a urine infection before?" If Yes, ask: "When was the last time?" and "What happened that time?"      yes 7. CAUSE: "What do you think is causing the painful urination?"  (e.g., UTI, scratch, Herpes sore)     uti 8. OTHER SYMPTOMS: "Do you have any other symptoms?" (e.g., blood in urine, flank pain, genital sores, urgency, vaginal discharge)     Right flank  pain  Protocols used: Urination Pain - Female-A-AH

## 2023-05-14 NOTE — Progress Notes (Signed)
   Care Guide Note  05/14/2023 Name: JNIYA KILGO MRN: [id number] DOB: 1966-01-19  Referred by: Erasmo Downer, MD Reason for referral : Care Coordination (Outreach to reschedule with pharm d )   OLAMAE DIEBEL is a 57 y.o. year old female who is a primary care patient of Beryle Flock, Marzella Schlein, MD. Brain Hilts was referred to the pharmacist for assistance related to HTN and DM.    An unsuccessful telephone outreach was attempted today to contact the patient who was referred to the pharmacy team for assistance with medication management. Additional attempts will be made to contact the patient.   Penne Lash, RMA Care Guide [facility]  [place], [place] [address] Direct Dial: [phone] [email or web address]

## 2023-05-15 LAB — COMPREHENSIVE METABOLIC PANEL
ALT: 10 IU/L (ref 0–32)
AST: 15 IU/L (ref 0–40)
Albumin: 4.5 g/dL (ref 3.8–4.9)
Alkaline Phosphatase: 57 IU/L (ref 44–121)
BUN/Creatinine Ratio: 21 (ref 9–23)
BUN: 28 mg/dL — ABNORMAL HIGH (ref 6–24)
Bilirubin Total: 0.4 mg/dL (ref 0.0–1.2)
CO2: 22 mmol/L (ref 20–29)
Calcium: 9.7 mg/dL (ref 8.7–10.2)
Chloride: 104 mmol/L (ref 96–106)
Creatinine, Ser: 1.31 mg/dL — ABNORMAL HIGH (ref 0.57–1.00)
Globulin, Total: 2.4 g/dL (ref 1.5–4.5)
Glucose: 110 mg/dL — ABNORMAL HIGH (ref 70–99)
Potassium: 4.4 mmol/L (ref 3.5–5.2)
Sodium: 141 mmol/L (ref 134–144)
Total Protein: 6.9 g/dL (ref 6.0–8.5)
eGFR: 48 mL/min/{1.73_m2} — ABNORMAL LOW (ref >59)

## 2023-05-16 LAB — URINE CULTURE

## 2023-05-20 NOTE — Progress Notes (Signed)
   Care Guide Note  05/20/2023 Name: DAILANY MOCCIA MRN: [id number] DOB: 10-04-1966  Referred by: Erasmo Downer, MD Reason for referral : Care Coordination (Outreach to reschedule with pharm d )   LAQUNDA GATT is a 57 y.o. year old female who is a primary care patient of Beryle Flock, Marzella Schlein, MD. Brain Hilts was referred to the pharmacist for assistance related to DM.    A second unsuccessful telephone outreach was attempted today to contact the patient who was referred to the pharmacy team for assistance with medication management. Additional attempts will be made to contact the patient.  Penne Lash, RMA Care Guide [facility]  [place], [place] [address] Direct Dial: [phone] [email or web address]

## 2023-05-29 NOTE — Progress Notes (Signed)
   Care Guide Note  05/29/2023 Name: Cassandra Butler MRN: [id number] DOB: 03-29-1966  Referred by: Erasmo Downer, MD Reason for referral : Care Coordination (Outreach to reschedule with pharm d )   Cassandra Butler is a 57 y.o. year old female who is a primary care patient of Beryle Flock, Marzella Schlein, MD. Brain Hilts was referred to the pharmacist for assistance related to DM.    Successful contact was made with the patient to discuss pharmacy services including being ready for the pharmacist to call at least 5 minutes before the scheduled appointment time, to have medication bottles and any blood sugar or blood pressure readings ready for review. The patient agreed to meet with the pharmacist via with the pharmacist via telephone visit on (date/time).   07/02/2023 Penne Lash, RMA Care Guide [facility]  [place], [place] [address] Direct Dial: [phone] [email or web address]

## 2023-07-02 ENCOUNTER — Telehealth: Payer: Self-pay | Admitting: Pharmacist

## 2023-07-02 ENCOUNTER — Other Ambulatory Visit: Payer: BC Managed Care – PPO | Admitting: Pharmacist

## 2023-07-02 NOTE — Progress Notes (Signed)
   Outreach Note  07/02/2023 Name: Cassandra Butler MRN: [id number] DOB: 1966-10-21  Referred by: Erasmo Downer, MD  Was unable to reach patient via telephone today and have left HIPAA compliant voicemail asking patient to return my call. Outreach attempt #2   Follow Up Plan: Will collaborate with Care Guide to outreach to schedule follow up with me  Estelle Grumbles, PharmD, [facility] Clinical Pharmacist [facility] [phone]

## 2023-07-03 ENCOUNTER — Encounter: Payer: Self-pay | Admitting: Family Medicine

## 2023-07-03 ENCOUNTER — Ambulatory Visit (INDEPENDENT_AMBULATORY_CARE_PROVIDER_SITE_OTHER): Payer: PRIVATE HEALTH INSURANCE | Admitting: Family Medicine

## 2023-07-03 VITALS — BP 125/74 | HR 88 | Temp 97.6°F | Resp 12 | Ht 66.0 in | Wt 159.8 lb

## 2023-07-03 DIAGNOSIS — E559 Vitamin D deficiency, unspecified: Secondary | ICD-10-CM

## 2023-07-03 DIAGNOSIS — E785 Hyperlipidemia, unspecified: Secondary | ICD-10-CM

## 2023-07-03 DIAGNOSIS — E781 Pure hyperglyceridemia: Secondary | ICD-10-CM

## 2023-07-03 DIAGNOSIS — D696 Thrombocytopenia, unspecified: Secondary | ICD-10-CM

## 2023-07-03 DIAGNOSIS — Z0000 Encounter for general adult medical examination without abnormal findings: Secondary | ICD-10-CM

## 2023-07-03 DIAGNOSIS — E1159 Type 2 diabetes mellitus with other circulatory complications: Secondary | ICD-10-CM

## 2023-07-03 DIAGNOSIS — E1165 Type 2 diabetes mellitus with hyperglycemia: Secondary | ICD-10-CM | POA: Diagnosis not present

## 2023-07-03 DIAGNOSIS — Z1211 Encounter for screening for malignant neoplasm of colon: Secondary | ICD-10-CM

## 2023-07-03 DIAGNOSIS — E1169 Type 2 diabetes mellitus with other specified complication: Secondary | ICD-10-CM | POA: Diagnosis not present

## 2023-07-03 DIAGNOSIS — R55 Syncope and collapse: Secondary | ICD-10-CM

## 2023-07-03 DIAGNOSIS — R011 Cardiac murmur, unspecified: Secondary | ICD-10-CM

## 2023-07-03 DIAGNOSIS — I152 Hypertension secondary to endocrine disorders: Secondary | ICD-10-CM

## 2023-07-03 DIAGNOSIS — Z7984 Long term (current) use of oral hypoglycemic drugs: Secondary | ICD-10-CM

## 2023-07-03 DIAGNOSIS — Z0001 Encounter for general adult medical examination with abnormal findings: Secondary | ICD-10-CM | POA: Diagnosis not present

## 2023-07-03 DIAGNOSIS — Z1231 Encounter for screening mammogram for malignant neoplasm of breast: Secondary | ICD-10-CM

## 2023-07-03 LAB — POCT GLYCOSYLATED HEMOGLOBIN (HGB A1C): Hemoglobin A1C: 6.3 % — AB (ref 4.0–5.6)

## 2023-07-03 NOTE — Progress Notes (Signed)
Complete physical exam  Patient: Cassandra Butler   DOB: [birth year]   56 y.o. Female  MRN: [id number]  Subjective:    Chief Complaint  Patient presents with   Annual Exam    Patient reports feeling well. She stopped taking lisinopril 3 days after it was prescribed, due to low BP readings. She declined shingles and tdap vaccine.     Cassandra Butler is a 57 y.o. female who presents today for a complete physical exam. She reports consuming a general diet.  She generally feels fairly well. She reports sleeping fairly well. She does have additional problems to discuss today.   Discussed the use of AI scribe software for clinical note transcription with the patient, who gave verbal consent to proceed.  History of Present Illness   The patient, with a history of diabetes and rheumatoid arthritis, presents with episodes of syncope, particularly when looking up or tilting their head back. They report two instances of complete loss of consciousness, both brief. The patient also experiences a sensation of impending fainting, which they can usually mitigate by getting up and walking. They report that these episodes have continued even after discontinuing lisinopril, which they were taking for blood pressure management. The patient also mentions chest pain, which they attribute to heartburn, and has been managing with antacids. They have been managing their diabetes with diet control and metformin, and report good control of their blood sugar levels.        Most recent fall risk assessment:    07/03/2023    8:44 AM  Fall Risk   Falls in the past year? 0  Number falls in past yr: 0  Injury with Fall? 0  Risk for fall due to : No Fall Risks  Follow up Falls evaluation completed     Most recent depression screenings:    07/03/2023    8:44 AM 05/14/2023    2:25 PM  PHQ 2/9 Scores  PHQ - 2 Score 0 0  PHQ- 9 Score 0         Patient Care Team: Erasmo Downer, MD as PCP - General  (Family Medicine) Malva Limes, MD as Referring Physician (Family Medicine) Lemar Livings, Merrily Pew, MD (General Surgery) Anola Gurney, [place] as Referring Physician (Family Medicine) Ronney Asters, Jackelyn Poling, RPH-CPP as Pharmacist   Outpatient Medications Prior to Visit  Medication Sig   Blood Glucose Monitoring Suppl (ONE TOUCH ULTRA 2) w/Device KIT Test blood glucose once daily.   fenofibrate (TRICOR) 145 MG tablet Take 1 tablet (145 mg total) by mouth daily.   folic acid (FOLVITE) 1 MG tablet Take 1 mg by mouth daily.   glucose blood (ONETOUCH ULTRA) test strip Use as instructed   metFORMIN (GLUCOPHAGE-XR) 500 MG 24 hr tablet Take 1 tablet (500 mg total) by mouth daily with supper.   methotrexate (RHEUMATREX) 2.5 MG tablet Take 15 mg by mouth once a week.   omeprazole (PRILOSEC) 20 MG capsule TAKE 1 CAPSULE(20 MG) BY MOUTH DAILY   OneTouch Delica Lancets 33G MISC Use as directed   sertraline (ZOLOFT) 100 MG tablet TAKE 1 TABLET(100 MG) BY MOUTH DAILY   [DISCONTINUED] lisinopril (ZESTRIL) 10 MG tablet Take 0.5 tablets (5 mg total) by mouth daily. (Patient not taking: Reported on 07/03/2023)   No facility-administered medications prior to visit.    ROS per HPI     Objective:     BP 125/74 (BP Location: Right Arm, Patient Position: Sitting, Cuff Size: Normal)   Pulse  88   Temp 97.6 F (36.4 C) (Temporal)   Resp 12   Ht 5\' 6"  (1.676 m)   Wt 159 lb 12.8 oz (72.5 kg)   BMI 25.79 kg/m    Physical Exam Vitals reviewed.  Constitutional:      General: She is not in acute distress.    Appearance: Normal appearance. She is well-developed. She is not diaphoretic.  HENT:     Head: Normocephalic and atraumatic.     Right Ear: Tympanic membrane, ear canal and external ear normal.     Left Ear: Tympanic membrane, ear canal and external ear normal.     Nose: Nose normal.     Mouth/Throat:     Mouth: Mucous membranes are moist.     Pharynx: Oropharynx is clear. No oropharyngeal exudate.   Eyes:     General: No scleral icterus.    Conjunctiva/sclera: Conjunctivae normal.     Pupils: Pupils are equal, round, and reactive to light.  Neck:     Thyroid: No thyromegaly.  Cardiovascular:     Rate and Rhythm: Normal rate and regular rhythm.     Heart sounds: Murmur heard.  Pulmonary:     Effort: Pulmonary effort is normal. No respiratory distress.     Breath sounds: Normal breath sounds. No wheezing or rales.  Abdominal:     General: There is no distension.     Palpations: Abdomen is soft.     Tenderness: There is no abdominal tenderness.  Musculoskeletal:        General: No deformity.     Cervical back: Neck supple.     Right lower leg: No edema.     Left lower leg: No edema.  Lymphadenopathy:     Cervical: No cervical adenopathy.  Skin:    General: Skin is warm and dry.     Findings: No rash.  Neurological:     Mental Status: She is alert and oriented to person, place, and time. Mental status is at baseline.     Gait: Gait normal.  Psychiatric:        Mood and Affect: Mood normal.        Behavior: Behavior normal.        Thought Content: Thought content normal.      Results for orders placed or performed in visit on 07/03/23  POCT glycosylated hemoglobin (Hb A1C)  Result Value Ref Range   Hemoglobin A1C 6.3 (A) 4.0 - 5.6 %       Assessment & Plan:    Routine Health Maintenance and Physical Exam  Immunization History  Administered Date(s) Administered   Moderna Sars-Covid-2 Vaccination 06/21/2020, 07/19/2020   Pneumococcal Polysaccharide-23 07/28/2019   Td 11/12/1998   Tdap 02/14/2010    Health Maintenance  Topic Date Due   HIV Screening  Never done   Zoster Vaccines- Shingrix (1 of 2) Never done   DTaP/Tdap/Td (3 - Td or Tdap) 02/15/2020   Colonoscopy  04/16/2020   COVID-19 Vaccine (3 - Moderna risk series) 08/16/2020   MAMMOGRAM  01/05/2021   FOOT EXAM  03/20/2023   INFLUENZA VACCINE  02/09/2024 (Originally 06/12/2023)   HEMOGLOBIN A1C   01/03/2024   OPHTHALMOLOGY EXAM  03/18/2024   Diabetic kidney evaluation - Urine ACR  03/31/2024   Diabetic kidney evaluation - eGFR measurement  05/13/2024   Hepatitis C Screening  Completed   HPV VACCINES  Aged Out    Discussed health benefits of physical activity, and encouraged her to engage in regular  exercise appropriate for her age and condition.  Problem List Items Addressed This Visit       Cardiovascular and Mediastinum   Hypertension associated with diabetes (HCC)   Relevant Orders   Comprehensive metabolic panel     Endocrine   Hyperlipidemia associated with type 2 diabetes mellitus (HCC)   Relevant Orders   Lipid panel   Direct LDL   Comprehensive metabolic panel   Type 2 diabetes mellitus with hyperglycemia (HCC)   Relevant Orders   POCT glycosylated hemoglobin (Hb A1C) (Completed)   Comprehensive metabolic panel     Hematopoietic and Hemostatic   Thrombocytopenia (HCC)   Relevant Orders   CBC w/Diff/Platelet     Other   Avitaminosis D   Relevant Orders   VITAMIN D 25 Hydroxy (Vit-D Deficiency, Fractures)   Hypertriglyceridemia   Relevant Orders   Lipid panel   Other Visit Diagnoses     Encounter for annual physical exam    -  Primary   Relevant Orders   Lipid panel   Direct LDL   Comprehensive metabolic panel   CBC w/Diff/Platelet   VITAMIN D 25 Hydroxy (Vit-D Deficiency, Fractures)   Encounter for screening mammogram for malignant neoplasm of breast       Relevant Orders   MM 3D SCREENING MAMMOGRAM BILATERAL BREAST   Screen for colon cancer       Relevant Orders   Ambulatory referral to Gastroenterology   Syncope, unspecified syncope type       Relevant Orders   CT ANGIO NECK W OR WO CONTRAST   Murmur       Relevant Orders   ECHOCARDIOGRAM COMPLETE          Syncope Two episodes of loss of consciousness, possibly positional. Blood pressure was low on Lisinopril, which has been discontinued. -Order CTA of the neck to assess blood  flow in posterior neck.  New Heart Murmur New murmur detected on physical exam, no previous history of heart murmur. -Order echocardiogram to assess valve function and cause of murmur.  Type 2 Diabetes Mellitus Well-controlled with Metformin XR 500mg  daily. Recent A1c was 6.3. -Continue current management.  Hyperlipidemia Patient on Fenofibrate for high triglycerides. -Check lipid panel with direct LDL to assess cholesterol levels.  General Health Maintenance -Order mammogram and colonoscopy for routine cancer screening. -Check complete blood count, kidney and liver function, and Vitamin D levels. -Consider tetanus shot at next visit. -Follow-up in 6 months for diabetes management.       Return in about 6 months (around 01/03/2024) for chronic disease f/u.     Shirlee Latch, MD

## 2023-07-03 NOTE — Patient Instructions (Signed)
Call [facility] to schedule a mammogram [phone]  

## 2023-07-04 ENCOUNTER — Telehealth: Payer: Self-pay | Admitting: Family Medicine

## 2023-07-04 LAB — COMPREHENSIVE METABOLIC PANEL
ALT: 24 IU/L (ref 0–32)
AST: 23 IU/L (ref 0–40)
Albumin: 4.6 g/dL (ref 3.8–4.9)
Alkaline Phosphatase: 62 IU/L (ref 44–121)
BUN/Creatinine Ratio: 23 (ref 9–23)
BUN: 25 mg/dL — ABNORMAL HIGH (ref 6–24)
Bilirubin Total: 0.4 mg/dL (ref 0.0–1.2)
CO2: 24 mmol/L (ref 20–29)
Calcium: 10 mg/dL (ref 8.7–10.2)
Chloride: 102 mmol/L (ref 96–106)
Creatinine, Ser: 1.08 mg/dL — ABNORMAL HIGH (ref 0.57–1.00)
Globulin, Total: 2.3 g/dL (ref 1.5–4.5)
Glucose: 126 mg/dL — ABNORMAL HIGH (ref 70–99)
Potassium: 4.6 mmol/L (ref 3.5–5.2)
Sodium: 141 mmol/L (ref 134–144)
Total Protein: 6.9 g/dL (ref 6.0–8.5)
eGFR: 61 mL/min/{1.73_m2} (ref >59)

## 2023-07-04 LAB — CBC WITH DIFFERENTIAL/PLATELET
Basophils Absolute: 0 10*3/uL (ref 0.0–0.2)
Basos: 1 %
EOS (ABSOLUTE): 0 10*3/uL (ref 0.0–0.4)
Eos: 1 %
Hematocrit: 39.9 % (ref 34.0–46.6)
Hemoglobin: 13 g/dL (ref 11.1–15.9)
Immature Grans (Abs): 0 10*3/uL (ref 0.0–0.1)
Immature Granulocytes: 1 %
Lymphocytes Absolute: 0.9 10*3/uL (ref 0.7–3.1)
Lymphs: 18 %
MCH: 29.1 pg (ref 26.6–33.0)
MCHC: 32.6 g/dL (ref 31.5–35.7)
MCV: 89 fL (ref 79–97)
Monocytes Absolute: 0.2 10*3/uL (ref 0.1–0.9)
Monocytes: 4 %
Neutrophils Absolute: 3.7 10*3/uL (ref 1.4–7.0)
Neutrophils: 75 %
Platelets: 95 10*3/uL — CL (ref 150–450)
RBC: 4.47 x10E6/uL (ref 3.77–5.28)
RDW: 14 % (ref 11.7–15.4)
WBC: 4.9 10*3/uL (ref 3.4–10.8)

## 2023-07-04 LAB — LIPID PANEL
Chol/HDL Ratio: 7.8 ratio — ABNORMAL HIGH (ref 0.0–4.4)
Cholesterol, Total: 259 mg/dL — ABNORMAL HIGH (ref 100–199)
HDL: 33 mg/dL — ABNORMAL LOW (ref >39)
LDL Chol Calc (NIH): 170 mg/dL — ABNORMAL HIGH (ref 0–99)
Triglycerides: 294 mg/dL — ABNORMAL HIGH (ref 0–149)
VLDL Cholesterol Cal: 56 mg/dL — ABNORMAL HIGH (ref 5–40)

## 2023-07-04 LAB — LDL CHOLESTEROL, DIRECT: LDL Direct: 163 mg/dL — ABNORMAL HIGH (ref 0–99)

## 2023-07-04 LAB — VITAMIN D 25 HYDROXY (VIT D DEFICIENCY, FRACTURES): Vit D, 25-Hydroxy: 16.8 ng/mL — ABNORMAL LOW (ref 30.0–100.0)

## 2023-07-04 MED ORDER — FENOFIBRATE 145 MG PO TABS: 145.0000 mg | ORAL_TABLET | Freq: Every day | ORAL | 1 refills | Status: DC

## 2023-07-04 NOTE — Telephone Encounter (Signed)
[facility] pharmacy is requesting prescription refill fenofibrate (TRICOR) 145 MG tablet   Please advise

## 2023-07-07 ENCOUNTER — Ambulatory Visit
Admission: RE | Admit: 2023-07-07 | Discharge: 2023-07-07 | Disposition: A | Discharge status: Home / Self Care | Payer: PRIVATE HEALTH INSURANCE | Source: Ambulatory Visit | Attending: Family Medicine | Admitting: Family Medicine

## 2023-07-07 DIAGNOSIS — R55 Syncope and collapse: Secondary | ICD-10-CM | POA: Insufficient documentation

## 2023-07-07 MED ORDER — IOHEXOL 350 MG/ML SOLN
75.0000 mL | Freq: Once | INTRAVENOUS | Status: AC | PRN
  Administered 2023-07-07: 75 mL via INTRAVENOUS

## 2023-07-08 ENCOUNTER — Telehealth: Payer: Self-pay

## 2023-07-08 MED ORDER — ROSUVASTATIN CALCIUM 5 MG PO TABS: 5.0000 mg | ORAL_TABLET | Freq: Every day | ORAL | 3 refills | Status: DC

## 2023-07-08 MED ORDER — COENZYME Q10 30 MG PO CAPS: 30.0000 mg | ORAL_CAPSULE | Freq: Three times a day (TID) | ORAL | 3 refills | Status: AC

## 2023-07-08 NOTE — Telephone Encounter (Signed)
-----   Message from Shirlee Latch sent at 07/07/2023  8:11 AM EDT ----- Normal/stable labs, except high cholesterol.  The 10-year ASCVD risk score (Arnett DK, et al., 2019) is: 17.8%, which means that risk of heart disease and stroke is high.  Would recommend statin to lower this risk.  If patient agrees, could start Crestor 5mg  daily. Advise to take with CoQ10 100mg  daily to avoid muscle cramps. Vit D level is low.  Recommend starting OTC Vit D3 1000-2000 units daily.

## 2023-07-15 ENCOUNTER — Telehealth: Payer: Self-pay

## 2023-07-15 DIAGNOSIS — I6523 Occlusion and stenosis of bilateral carotid arteries: Secondary | ICD-10-CM

## 2023-07-15 NOTE — Telephone Encounter (Signed)
-----   Message from Shirlee Latch sent at 07/15/2023  8:55 AM EDT ----- Carotid artery stenosis (plaques in the big arteries in the front of the neck that bring blood flow to the brain). Need to see vascular surgery about next steps for treatment. Be sure to take rosuvastatin as prescribed. Add a baby aspirin daily.  CMAs, please place referral to vascular if patient agrees.

## 2023-07-16 ENCOUNTER — Encounter: Payer: Self-pay | Admitting: *Deleted

## 2023-07-16 ENCOUNTER — Encounter: Payer: Self-pay | Admitting: Family Medicine

## 2023-07-21 ENCOUNTER — Telehealth: Payer: Self-pay | Admitting: Family Medicine

## 2023-07-21 DIAGNOSIS — F411 Generalized anxiety disorder: Secondary | ICD-10-CM

## 2023-07-21 MED ORDER — SERTRALINE HCL 100 MG PO TABS
100.0000 mg | ORAL_TABLET | Freq: Every day | ORAL | 1 refills | Status: DC
Start: 2023-07-21 — End: 2024-01-12

## 2023-07-21 NOTE — Telephone Encounter (Signed)
[facility] faxed refill request for the following medications:  sertraline (ZOLOFT) 100 MG tablet   Please advise.  

## 2023-07-22 ENCOUNTER — Ambulatory Visit (INDEPENDENT_AMBULATORY_CARE_PROVIDER_SITE_OTHER): Payer: PRIVATE HEALTH INSURANCE | Admitting: Vascular Surgery

## 2023-07-22 ENCOUNTER — Encounter (INDEPENDENT_AMBULATORY_CARE_PROVIDER_SITE_OTHER): Payer: Self-pay | Admitting: Vascular Surgery

## 2023-07-22 VITALS — BP 115/70 | HR 96 | Resp 18 | Ht 66.0 in | Wt 163.8 lb

## 2023-07-22 DIAGNOSIS — I6529 Occlusion and stenosis of unspecified carotid artery: Secondary | ICD-10-CM | POA: Insufficient documentation

## 2023-07-22 DIAGNOSIS — E1165 Type 2 diabetes mellitus with hyperglycemia: Secondary | ICD-10-CM | POA: Diagnosis not present

## 2023-07-22 DIAGNOSIS — M79604 Pain in right leg: Secondary | ICD-10-CM | POA: Diagnosis not present

## 2023-07-22 DIAGNOSIS — E1159 Type 2 diabetes mellitus with other circulatory complications: Secondary | ICD-10-CM

## 2023-07-22 DIAGNOSIS — I739 Peripheral vascular disease, unspecified: Secondary | ICD-10-CM | POA: Insufficient documentation

## 2023-07-22 DIAGNOSIS — I152 Hypertension secondary to endocrine disorders: Secondary | ICD-10-CM

## 2023-07-22 DIAGNOSIS — I6523 Occlusion and stenosis of bilateral carotid arteries: Secondary | ICD-10-CM | POA: Diagnosis not present

## 2023-07-22 DIAGNOSIS — M79609 Pain in unspecified limb: Secondary | ICD-10-CM | POA: Insufficient documentation

## 2023-07-22 DIAGNOSIS — M79605 Pain in left leg: Secondary | ICD-10-CM

## 2023-07-22 MED ORDER — CLOPIDOGREL BISULFATE 75 MG PO TABS: 75.0000 mg | ORAL_TABLET | Freq: Every day | ORAL | 6 refills | Status: DC

## 2023-07-22 NOTE — Assessment & Plan Note (Signed)
The patient has undergone a CT angiogram which I have independently reviewed.  This is reported as a high-grade string sign stenosis in the right carotid artery with an occlusion of the left carotid artery.  I believe that to be fairly accurate.  I had a long discussion with the patient today regarding the serious nature of the situation and why it requires treatment.  She is at high risk of stroke.  Her syncopal episodes are very likely to be related to her significant carotid occlusive disease.  I discussed with her treatment options including carotid endarterectomy and carotid artery stenting.  I discussed that with a contralateral occlusion, carotid stenting is generally preferred.  Also discussed that carotid endarterectomy would still be a reasonable option but with the contralateral occlusion that does carry a slightly higher risk of stroke.  Her anatomy seems to be reasonably acceptable for carotid artery stenting.  I have recommended going ahead and putting her on Plavix for 5 to 7 days prior to the procedure.  She is already on aspirin and a statin agent.  I have discussed the risks and benefits of carotid stenting.  I discussed the differences between carotid stenting, carotid endarterectomy, and TCAR.  Patient voices her understanding and is agreeable to proceed.

## 2023-07-22 NOTE — Progress Notes (Signed)
Patient ID: Cassandra Butler, female   DOB: 11-Sep-1966, 57 y.o.   MRN: [id number]  Chief Complaint  Patient presents with   Establish Care    HPI Cassandra Butler is a 57 y.o. female.  I am asked to see the patient by Dr. Beryle Flock for evaluation of carotid stenosis.  The patient has had 2 syncopal episodes.  She also has had multiple episodes of dizziness and lightheadedness particularly when she looks up or turns her head.  This has been going on now for months to years.  She has not had any obvious clear focal neurologic symptoms such as arm or leg weakness or numbness, speech or swallowing difficulty, or temporary monocular blindness.  Her primary care physician astutely ordered a CT angiogram for further evaluation.   The patient has undergone a CT angiogram which I have independently reviewed.  This is reported as a high-grade string sign stenosis in the right carotid artery with an occlusion of the left carotid artery.  I believe that to be fairly accurate.    Past Medical History:  Diagnosis Date   Cancer (HCC) 11/14/2007   Left breast wide excision, sentinel node biopsy. 1.4 cm, T1c, N0: ER 90%; PR 905; Her 2 neu not over expressed.    Diabetes mellitus without complication (HCC)    GERD (gastroesophageal reflux disease)    Hyperlipidemia    Hypertension    Vitamin D deficiency     Past Surgical History:  Procedure Laterality Date   ABDOMINAL HYSTERECTOMY  2004   total, due to fibriods. Also had ovaries removed after 1 year of breast cancer.    BREAST LUMPECTOMY Left 2010   COLONOSCOPY     25 yrs ago   COLONOSCOPY  04/16/2010   Dr Bluford Kaufmann     Family History  Problem Relation Age of Onset   Stroke Mother    Diabetes Mother    Heart disease Father    Heart disease Sister   No bleeding disorders or clotting disorders. No aneurysms    Social History   Tobacco Use   Smoking status: Every Day    Current packs/day: 1.00    Types: Cigarettes   Smokeless tobacco:  Never  Vaping Use   Vaping status: Never Used  Substance Use Topics   Alcohol use: No   Drug use: No     No Known Allergies  Current Outpatient Medications  Medication Sig Dispense Refill   aspirin 81 MG chewable tablet Chew by mouth daily.     Blood Glucose Monitoring Suppl (ONE TOUCH ULTRA 2) w/Device KIT Test blood glucose once daily. 1 kit 0   Cholecalciferol (VITAMIN D3) 50 MCG (2000 UT) TABS Take by mouth.     co-enzyme Q-10 30 MG capsule Take 1 capsule (30 mg total) by mouth 3 (three) times daily. 30 capsule 3   fenofibrate (TRICOR) 145 MG tablet Take 1 tablet (145 mg total) by mouth daily. 90 tablet 1   folic acid (FOLVITE) 1 MG tablet Take 1 mg by mouth daily.     glucose blood (ONETOUCH ULTRA) test strip Use as instructed 100 each 12   metFORMIN (GLUCOPHAGE-XR) 500 MG 24 hr tablet Take 1 tablet (500 mg total) by mouth daily with supper. 30 tablet 3   methotrexate (RHEUMATREX) 2.5 MG tablet Take 15 mg by mouth once a week.     omeprazole (PRILOSEC) 20 MG capsule TAKE 1 CAPSULE(20 MG) BY MOUTH DAILY 90 capsule 3   OneTouch Delica  Lancets 33G MISC Use as directed 100 each 3   rosuvastatin (CRESTOR) 5 MG tablet Take 1 tablet (5 mg total) by mouth daily. 90 tablet 3   sertraline (ZOLOFT) 100 MG tablet Take 1 tablet (100 mg total) by mouth daily. TAKE 1 TABLET(100 MG) BY MOUTH DAILY 90 tablet 1   No current facility-administered medications for this visit.      REVIEW OF SYSTEMS (Negative unless checked)  Constitutional: [] Weight loss  [] Fever  [] Chills Cardiac: [] Chest pain   [] Chest pressure   [] Palpitations   [] Shortness of breath when laying flat   [] Shortness of breath at rest   [] Shortness of breath with exertion. Vascular:  [] Pain in legs with walking   [] Pain in legs at rest   [] Pain in legs when laying flat   [] Claudication   [] Pain in feet when walking  [] Pain in feet at rest  [] Pain in feet when laying flat   [] History of DVT   [] Phlebitis   [] Swelling in legs    [] Varicose veins   [] Non-healing ulcers Pulmonary:   [] Uses home oxygen   [] Productive cough   [] Hemoptysis   [] Wheeze  [] COPD   [] Asthma Neurologic:  [x] Dizziness  [x] Blackouts   [] Seizures   [] History of stroke   [] History of TIA  [] Aphasia   [] Temporary blindness   [] Dysphagia   [] Weakness or numbness in arms   [] Weakness or numbness in legs Musculoskeletal:  [] Arthritis   [] Joint swelling   [x] Joint pain   [] Low back pain Hematologic:  [] Easy bruising  [] Easy bleeding   [] Hypercoagulable state   [] Anemic  [] Hepatitis Gastrointestinal:  [] Blood in stool   [] Vomiting blood  [x] Gastroesophageal reflux/heartburn   [] Abdominal pain Genitourinary:  [] Chronic kidney disease   [] Difficult urination  [] Frequent urination  [] Burning with urination   [] Hematuria Skin:  [] Rashes   [] Ulcers   [] Wounds Psychological:  [] History of anxiety   []  History of major depression.    Physical Exam BP 115/70 (BP Location: Left Arm)   Pulse 96   Resp 18   Ht 5\' 6"  (1.676 m)   Wt 163 lb 12.8 oz (74.3 kg)   BMI 26.44 kg/m  Gen:  WD/WN, NAD Head: [place]/AT, No temporalis wasting.  Ear/Nose/Throat: Hearing grossly intact, nares w/o erythema or drainage, oropharynx w/o Erythema/Exudate Eyes: Conjunctiva clear, sclera non-icteric  Neck: trachea midline.  No JVD.  Bilateral carotid bruits. Pulmonary:  Good air movement, respirations not labored, no use of accessory muscles  Cardiac: RRR, no JVD Vascular:  Vessel Right Left  Radial Palpable Palpable                                   Gastrointestinal:. No masses, surgical incisions, or scars. Musculoskeletal: M/S 5/5 throughout.  Extremities without ischemic changes.  No deformity or atrophy. No edema. Neurologic: Sensation grossly intact in extremities.  Symmetrical.  Speech is fluent. Motor exam as listed above. Psychiatric: Judgment intact, Mood & affect appropriate for pt's clinical situation. Dermatologic: No rashes or ulcers noted.  No cellulitis or  open wounds.    Radiology CT ANGIO NECK W OR WO CONTRAST  Result Date: 07/15/2023 CLINICAL DATA:  Near-syncope when looking straight up and dizziness for 1-2 weeks. EXAM: CT ANGIOGRAPHY NECK TECHNIQUE: Multidetector CT imaging of the neck was performed using the standard protocol during bolus administration of intravenous contrast. Multiplanar CT image reconstructions and MIPs were obtained to evaluate the vascular anatomy. Carotid  stenosis measurements (when applicable) are obtained utilizing NASCET criteria, using the distal internal carotid diameter as the denominator. RADIATION DOSE REDUCTION: This exam was performed according to the departmental dose-optimization program which includes automated exposure control, adjustment of the mA and/or kV according to patient size and/or use of iterative reconstruction technique. CONTRAST:  75mL OMNIPAQUE IOHEXOL 350 MG/ML SOLN COMPARISON:  CT neck with contrast 03/19/2010 FINDINGS: Aortic arch: There is mild calcified plaque in the aortic arch. The origins of the major branch vessels are patent. The subclavian arteries are patent to the level imaged. Right carotid system: There is mixed plaque in the mid common carotid artery without high-grade stenosis. There is bulky plaque at the bifurcation and in the proximal internal carotid artery resulting in critical stenosis of the proximal internal carotid artery with string sign (10-98). The distal internal carotid artery is patent. There is moderate to severe stenosis of the origin of the external carotid artery. There is no evidence of dissection or aneurysm. Left carotid system: There is noncalcified plaque at the origin of the common carotid artery resulting in 50-60% stenosis. There is bulky plaque at the bifurcation with occlusion of the left internal carotid artery throughout its course with reconstitution of flow at the ophthalmic segment. This is age-indeterminate but new since 2011. The external carotid artery  is patent. There is no evidence of aneurysm. Vertebral arteries: The vertebral arteries are patent throughout, without hemodynamically significant stenosis or occlusion there is no evidence of dissection or aneurysm. Skeleton: There is no acute osseous abnormality or suspicious osseous lesion. Other neck: The soft tissues of the neck are unremarkable. Upper chest: There is centrilobular and paraseptal emphysema in the lung apices. IMPRESSION: 1. Bulky plaque at the right carotid bifurcation resulting in critical stenosis of the proximal internal carotid artery with radiographic string sign. 2. The left internal carotid artery is occluded throughout its course with reconstitution of flow at the ophthalmic segment. This is age-indeterminate but new since 2011. 3. Noncalcified plaque at the origin of the left common carotid artery resulting in 50-60% stenosis. 4. Patent vertebral arteries. 5. Emphysema in the lung apices. Electronically Signed   By: Lesia Hausen M.D.   On: 07/15/2023 08:34    Labs Recent Results (from the past 2160 hour(s))  Urine Culture     Status: None   Collection Time: 05/14/23 12:00 AM   Specimen: Urine   Urine  Result Value Ref Range   Urine Culture, Routine Final report    Organism ID, Bacteria Comment     Comment: Mixed urogenital flora 10,000-25,000 colony forming units per mL   POCT Urinalysis Dipstick     Status: Abnormal   Collection Time: 05/14/23  2:33 PM  Result Value Ref Range   Color, UA yellow    Clarity, UA dark    Glucose, UA Negative Negative   Bilirubin, UA Negative    Ketones, UA Negative    Spec Grav, UA 1.020 1.010 - 1.025   Blood, UA Large    pH, UA 6.0 5.0 - 8.0   Protein, UA Positive (A) Negative   Urobilinogen, UA 0.2 0.2 or 1.0 E.U./dL   Nitrite, UA Negative    Leukocytes, UA Moderate (2+) (A) Negative  Comprehensive metabolic panel     Status: Abnormal   Collection Time: 05/14/23  2:54 PM  Result Value Ref Range   Glucose 110 (H) 70 - 99  mg/dL   BUN 28 (H) 6 - 24 mg/dL   Creatinine, Ser 1.61 (  H) 0.57 - 1.00 mg/dL   eGFR 48 (L) >29 FA/OZH/0.86   BUN/Creatinine Ratio 21 9 - 23   Sodium 141 134 - 144 mmol/L   Potassium 4.4 3.5 - 5.2 mmol/L   Chloride 104 96 - 106 mmol/L   CO2 22 20 - 29 mmol/L   Calcium 9.7 8.7 - 10.2 mg/dL   Total Protein 6.9 6.0 - 8.5 g/dL   Albumin 4.5 3.8 - 4.9 g/dL   Globulin, Total 2.4 1.5 - 4.5 g/dL   Bilirubin Total 0.4 0.0 - 1.2 mg/dL   Alkaline Phosphatase 57 44 - 121 IU/L   AST 15 0 - 40 IU/L   ALT 10 0 - 32 IU/L  POCT glycosylated hemoglobin (Hb A1C)     Status: Abnormal   Collection Time: 07/03/23  8:51 AM  Result Value Ref Range   Hemoglobin A1C 6.3 (A) 4.0 - 5.6 %  Lipid panel     Status: Abnormal   Collection Time: 07/03/23  9:55 AM  Result Value Ref Range   Cholesterol, Total 259 (H) 100 - 199 mg/dL   Triglycerides 578 (H) 0 - 149 mg/dL   HDL 33 (L) >46 mg/dL   VLDL Cholesterol Cal 56 (H) 5 - 40 mg/dL   LDL Chol Calc (NIH) 962 (H) 0 - 99 mg/dL   LDL CALC COMMENT: Comment     Comment: See LDL Comment if reported.   Chol/HDL Ratio 7.8 (H) 0.0 - 4.4 ratio    Comment:                                   T. Chol/HDL Ratio                                             Men  Women                               1/2 Avg.Risk  3.4    3.3                                   Avg.Risk  5.0    4.4                                2X Avg.Risk  9.6    7.1                                3X Avg.Risk 23.4   11.0   Direct LDL     Status: Abnormal   Collection Time: 07/03/23  9:55 AM  Result Value Ref Range   LDL Direct 163 (H) 0 - 99 mg/dL  Comprehensive metabolic panel     Status: Abnormal   Collection Time: 07/03/23  9:55 AM  Result Value Ref Range   Glucose 126 (H) 70 - 99 mg/dL   BUN 25 (H) 6 - 24 mg/dL   Creatinine, Ser 9.52 (H) 0.57 - 1.00 mg/dL   eGFR 61 >84 XL/KGM/0.10   BUN/Creatinine Ratio 23 9 - 23   Sodium 141 134 - 144  mmol/L   Potassium 4.6 3.5 - 5.2 mmol/L   Chloride 102 96 - 106  mmol/L   CO2 24 20 - 29 mmol/L   Calcium 10.0 8.7 - 10.2 mg/dL   Total Protein 6.9 6.0 - 8.5 g/dL   Albumin 4.6 3.8 - 4.9 g/dL   Globulin, Total 2.3 1.5 - 4.5 g/dL   Bilirubin Total 0.4 0.0 - 1.2 mg/dL   Alkaline Phosphatase 62 44 - 121 IU/L   AST 23 0 - 40 IU/L   ALT 24 0 - 32 IU/L  CBC w/Diff/Platelet     Status: Abnormal   Collection Time: 07/03/23  9:55 AM  Result Value Ref Range   WBC 4.9 3.4 - 10.8 x10E3/uL   RBC 4.47 3.77 - 5.28 x10E6/uL    Comment: Few tear drops.   Hemoglobin 13.0 11.1 - 15.9 g/dL   Hematocrit 40.3 47.4 - 46.6 %   MCV 89 79 - 97 fL   MCH 29.1 26.6 - 33.0 pg   MCHC 32.6 31.5 - 35.7 g/dL   RDW 25.9 56.3 - 87.5 %   Platelets 95 (LL) 150 - 450 x10E3/uL    Comment: Actual platelet count may be somewhat higher than reported due to aggregation of platelets in this sample.    Neutrophils 75 Not Estab. %    Comment: Occasional metamyelocyte seen on scan.   Lymphs 18 Not Estab. %   Monocytes 4 Not Estab. %   Eos 1 Not Estab. %   Basos 1 Not Estab. %   Neutrophils Absolute 3.7 1.4 - 7.0 x10E3/uL   Lymphocytes Absolute 0.9 0.7 - 3.1 x10E3/uL   Monocytes Absolute 0.2 0.1 - 0.9 x10E3/uL   EOS (ABSOLUTE) 0.0 0.0 - 0.4 x10E3/uL   Basophils Absolute 0.0 0.0 - 0.2 x10E3/uL   Immature Granulocytes 1 Not Estab. %   Immature Grans (Abs) 0.0 0.0 - 0.1 x10E3/uL   Hematology Comments: Note:     Comment: Verified by microscopic examination.  VITAMIN D 25 Hydroxy (Vit-D Deficiency, Fractures)     Status: Abnormal   Collection Time: 07/03/23  9:55 AM  Result Value Ref Range   Vit D, 25-Hydroxy 16.8 (L) 30.0 - 100.0 ng/mL    Comment: Vitamin D deficiency has been defined by the Institute of Medicine and an Endocrine Society practice guideline as a level of serum 25-OH vitamin D less than 20 ng/mL (1,2). The Endocrine Society went on to further define vitamin D insufficiency as a level between 21 and 29 ng/mL (2). 1. IOM (Institute of Medicine). 2010. Dietary  reference    intakes for calcium and D. [place]: The    [employer]. 2. Holick MF, Binkley [place], Bischoff-Ferrari HA, et al.    Evaluation, treatment, and prevention of vitamin D    deficiency: an Endocrine Society clinical practice    guideline. JCEM. 2011 Jul; 96(7):1911-30.     Assessment/Plan:  Carotid stenosis The patient has undergone a CT angiogram which I have independently reviewed.  This is reported as a high-grade string sign stenosis in the right carotid artery with an occlusion of the left carotid artery.  I believe that to be fairly accurate.  I had a long discussion with the patient today regarding the serious nature of the situation and why it requires treatment.  She is at high risk of stroke.  Her syncopal episodes are very likely to be related to her significant carotid occlusive disease.  I discussed with her treatment options including carotid endarterectomy  and carotid artery stenting.  I discussed that with a contralateral occlusion, carotid stenting is generally preferred.  Also discussed that carotid endarterectomy would still be a reasonable option but with the contralateral occlusion that does carry a slightly higher risk of stroke.  Her anatomy seems to be reasonably acceptable for carotid artery stenting.  I have recommended going ahead and putting her on Plavix for 5 to 7 days prior to the procedure.  She is already on aspirin and a statin agent.  I have discussed the risks and benefits of carotid stenting.  I discussed the differences between carotid stenting, carotid endarterectomy, and TCAR.  Patient voices her understanding and is agreeable to proceed.      [name] 07/22/2023, 3:38 PM   This note was created with Dragon medical transcription system.  Any errors from dictation are unintentional.

## 2023-07-22 NOTE — Assessment & Plan Note (Signed)
blood glucose control important in reducing the progression of atherosclerotic disease. Also, involved in wound healing. On appropriate medications.  

## 2023-07-22 NOTE — H&P (View-Only) (Signed)
Patient ID: Cassandra Butler, female   DOB: 11-Sep-1966, 57 y.o.   MRN: [id number]  Chief Complaint  Patient presents with   Establish Care    HPI Cassandra Butler is a 57 y.o. female.  I am asked to see the patient by Dr. Beryle Flock for evaluation of carotid stenosis.  The patient has had 2 syncopal episodes.  She also has had multiple episodes of dizziness and lightheadedness particularly when she looks up or turns her head.  This has been going on now for months to years.  She has not had any obvious clear focal neurologic symptoms such as arm or leg weakness or numbness, speech or swallowing difficulty, or temporary monocular blindness.  Her primary care physician astutely ordered a CT angiogram for further evaluation.   The patient has undergone a CT angiogram which I have independently reviewed.  This is reported as a high-grade string sign stenosis in the right carotid artery with an occlusion of the left carotid artery.  I believe that to be fairly accurate.    Past Medical History:  Diagnosis Date   Cancer (HCC) 11/14/2007   Left breast wide excision, sentinel node biopsy. 1.4 cm, T1c, N0: ER 90%; PR 905; Her 2 neu not over expressed.    Diabetes mellitus without complication (HCC)    GERD (gastroesophageal reflux disease)    Hyperlipidemia    Hypertension    Vitamin D deficiency     Past Surgical History:  Procedure Laterality Date   ABDOMINAL HYSTERECTOMY  2004   total, due to fibriods. Also had ovaries removed after 1 year of breast cancer.    BREAST LUMPECTOMY Left 2010   COLONOSCOPY     25 yrs ago   COLONOSCOPY  04/16/2010   Dr Bluford Kaufmann     Family History  Problem Relation Age of Onset   Stroke Mother    Diabetes Mother    Heart disease Father    Heart disease Sister   No bleeding disorders or clotting disorders. No aneurysms    Social History   Tobacco Use   Smoking status: Every Day    Current packs/day: 1.00    Types: Cigarettes   Smokeless tobacco:  Never  Vaping Use   Vaping status: Never Used  Substance Use Topics   Alcohol use: No   Drug use: No     No Known Allergies  Current Outpatient Medications  Medication Sig Dispense Refill   aspirin 81 MG chewable tablet Chew by mouth daily.     Blood Glucose Monitoring Suppl (ONE TOUCH ULTRA 2) w/Device KIT Test blood glucose once daily. 1 kit 0   Cholecalciferol (VITAMIN D3) 50 MCG (2000 UT) TABS Take by mouth.     co-enzyme Q-10 30 MG capsule Take 1 capsule (30 mg total) by mouth 3 (three) times daily. 30 capsule 3   fenofibrate (TRICOR) 145 MG tablet Take 1 tablet (145 mg total) by mouth daily. 90 tablet 1   folic acid (FOLVITE) 1 MG tablet Take 1 mg by mouth daily.     glucose blood (ONETOUCH ULTRA) test strip Use as instructed 100 each 12   metFORMIN (GLUCOPHAGE-XR) 500 MG 24 hr tablet Take 1 tablet (500 mg total) by mouth daily with supper. 30 tablet 3   methotrexate (RHEUMATREX) 2.5 MG tablet Take 15 mg by mouth once a week.     omeprazole (PRILOSEC) 20 MG capsule TAKE 1 CAPSULE(20 MG) BY MOUTH DAILY 90 capsule 3   OneTouch Delica  Lancets 33G MISC Use as directed 100 each 3   rosuvastatin (CRESTOR) 5 MG tablet Take 1 tablet (5 mg total) by mouth daily. 90 tablet 3   sertraline (ZOLOFT) 100 MG tablet Take 1 tablet (100 mg total) by mouth daily. TAKE 1 TABLET(100 MG) BY MOUTH DAILY 90 tablet 1   No current facility-administered medications for this visit.      REVIEW OF SYSTEMS (Negative unless checked)  Constitutional: [] Weight loss  [] Fever  [] Chills Cardiac: [] Chest pain   [] Chest pressure   [] Palpitations   [] Shortness of breath when laying flat   [] Shortness of breath at rest   [] Shortness of breath with exertion. Vascular:  [] Pain in legs with walking   [] Pain in legs at rest   [] Pain in legs when laying flat   [] Claudication   [] Pain in feet when walking  [] Pain in feet at rest  [] Pain in feet when laying flat   [] History of DVT   [] Phlebitis   [] Swelling in legs    [] Varicose veins   [] Non-healing ulcers Pulmonary:   [] Uses home oxygen   [] Productive cough   [] Hemoptysis   [] Wheeze  [] COPD   [] Asthma Neurologic:  [x] Dizziness  [x] Blackouts   [] Seizures   [] History of stroke   [] History of TIA  [] Aphasia   [] Temporary blindness   [] Dysphagia   [] Weakness or numbness in arms   [] Weakness or numbness in legs Musculoskeletal:  [] Arthritis   [] Joint swelling   [x] Joint pain   [] Low back pain Hematologic:  [] Easy bruising  [] Easy bleeding   [] Hypercoagulable state   [] Anemic  [] Hepatitis Gastrointestinal:  [] Blood in stool   [] Vomiting blood  [x] Gastroesophageal reflux/heartburn   [] Abdominal pain Genitourinary:  [] Chronic kidney disease   [] Difficult urination  [] Frequent urination  [] Burning with urination   [] Hematuria Skin:  [] Rashes   [] Ulcers   [] Wounds Psychological:  [] History of anxiety   []  History of major depression.    Physical Exam BP 115/70 (BP Location: Left Arm)   Pulse 96   Resp 18   Ht 5\' 6"  (1.676 m)   Wt 163 lb 12.8 oz (74.3 kg)   BMI 26.44 kg/m  Gen:  WD/WN, NAD Head: [place]/AT, No temporalis wasting.  Ear/Nose/Throat: Hearing grossly intact, nares w/o erythema or drainage, oropharynx w/o Erythema/Exudate Eyes: Conjunctiva clear, sclera non-icteric  Neck: trachea midline.  No JVD.  Bilateral carotid bruits. Pulmonary:  Good air movement, respirations not labored, no use of accessory muscles  Cardiac: RRR, no JVD Vascular:  Vessel Right Left  Radial Palpable Palpable                                   Gastrointestinal:. No masses, surgical incisions, or scars. Musculoskeletal: M/S 5/5 throughout.  Extremities without ischemic changes.  No deformity or atrophy. No edema. Neurologic: Sensation grossly intact in extremities.  Symmetrical.  Speech is fluent. Motor exam as listed above. Psychiatric: Judgment intact, Mood & affect appropriate for pt's clinical situation. Dermatologic: No rashes or ulcers noted.  No cellulitis or  open wounds.    Radiology CT ANGIO NECK W OR WO CONTRAST  Result Date: 07/15/2023 CLINICAL DATA:  Near-syncope when looking straight up and dizziness for 1-2 weeks. EXAM: CT ANGIOGRAPHY NECK TECHNIQUE: Multidetector CT imaging of the neck was performed using the standard protocol during bolus administration of intravenous contrast. Multiplanar CT image reconstructions and MIPs were obtained to evaluate the vascular anatomy. Carotid  stenosis measurements (when applicable) are obtained utilizing NASCET criteria, using the distal internal carotid diameter as the denominator. RADIATION DOSE REDUCTION: This exam was performed according to the departmental dose-optimization program which includes automated exposure control, adjustment of the mA and/or kV according to patient size and/or use of iterative reconstruction technique. CONTRAST:  75mL OMNIPAQUE IOHEXOL 350 MG/ML SOLN COMPARISON:  CT neck with contrast 03/19/2010 FINDINGS: Aortic arch: There is mild calcified plaque in the aortic arch. The origins of the major branch vessels are patent. The subclavian arteries are patent to the level imaged. Right carotid system: There is mixed plaque in the mid common carotid artery without high-grade stenosis. There is bulky plaque at the bifurcation and in the proximal internal carotid artery resulting in critical stenosis of the proximal internal carotid artery with string sign (10-98). The distal internal carotid artery is patent. There is moderate to severe stenosis of the origin of the external carotid artery. There is no evidence of dissection or aneurysm. Left carotid system: There is noncalcified plaque at the origin of the common carotid artery resulting in 50-60% stenosis. There is bulky plaque at the bifurcation with occlusion of the left internal carotid artery throughout its course with reconstitution of flow at the ophthalmic segment. This is age-indeterminate but new since 2011. The external carotid artery  is patent. There is no evidence of aneurysm. Vertebral arteries: The vertebral arteries are patent throughout, without hemodynamically significant stenosis or occlusion there is no evidence of dissection or aneurysm. Skeleton: There is no acute osseous abnormality or suspicious osseous lesion. Other neck: The soft tissues of the neck are unremarkable. Upper chest: There is centrilobular and paraseptal emphysema in the lung apices. IMPRESSION: 1. Bulky plaque at the right carotid bifurcation resulting in critical stenosis of the proximal internal carotid artery with radiographic string sign. 2. The left internal carotid artery is occluded throughout its course with reconstitution of flow at the ophthalmic segment. This is age-indeterminate but new since 2011. 3. Noncalcified plaque at the origin of the left common carotid artery resulting in 50-60% stenosis. 4. Patent vertebral arteries. 5. Emphysema in the lung apices. Electronically Signed   By: Lesia Hausen M.D.   On: 07/15/2023 08:34    Labs Recent Results (from the past 2160 hour(s))  Urine Culture     Status: None   Collection Time: 05/14/23 12:00 AM   Specimen: Urine   Urine  Result Value Ref Range   Urine Culture, Routine Final report    Organism ID, Bacteria Comment     Comment: Mixed urogenital flora 10,000-25,000 colony forming units per mL   POCT Urinalysis Dipstick     Status: Abnormal   Collection Time: 05/14/23  2:33 PM  Result Value Ref Range   Color, UA yellow    Clarity, UA dark    Glucose, UA Negative Negative   Bilirubin, UA Negative    Ketones, UA Negative    Spec Grav, UA 1.020 1.010 - 1.025   Blood, UA Large    pH, UA 6.0 5.0 - 8.0   Protein, UA Positive (A) Negative   Urobilinogen, UA 0.2 0.2 or 1.0 E.U./dL   Nitrite, UA Negative    Leukocytes, UA Moderate (2+) (A) Negative  Comprehensive metabolic panel     Status: Abnormal   Collection Time: 05/14/23  2:54 PM  Result Value Ref Range   Glucose 110 (H) 70 - 99  mg/dL   BUN 28 (H) 6 - 24 mg/dL   Creatinine, Ser 1.61 (  H) 0.57 - 1.00 mg/dL   eGFR 48 (L) >29 FA/OZH/0.86   BUN/Creatinine Ratio 21 9 - 23   Sodium 141 134 - 144 mmol/L   Potassium 4.4 3.5 - 5.2 mmol/L   Chloride 104 96 - 106 mmol/L   CO2 22 20 - 29 mmol/L   Calcium 9.7 8.7 - 10.2 mg/dL   Total Protein 6.9 6.0 - 8.5 g/dL   Albumin 4.5 3.8 - 4.9 g/dL   Globulin, Total 2.4 1.5 - 4.5 g/dL   Bilirubin Total 0.4 0.0 - 1.2 mg/dL   Alkaline Phosphatase 57 44 - 121 IU/L   AST 15 0 - 40 IU/L   ALT 10 0 - 32 IU/L  POCT glycosylated hemoglobin (Hb A1C)     Status: Abnormal   Collection Time: 07/03/23  8:51 AM  Result Value Ref Range   Hemoglobin A1C 6.3 (A) 4.0 - 5.6 %  Lipid panel     Status: Abnormal   Collection Time: 07/03/23  9:55 AM  Result Value Ref Range   Cholesterol, Total 259 (H) 100 - 199 mg/dL   Triglycerides 578 (H) 0 - 149 mg/dL   HDL 33 (L) >46 mg/dL   VLDL Cholesterol Cal 56 (H) 5 - 40 mg/dL   LDL Chol Calc (NIH) 962 (H) 0 - 99 mg/dL   LDL CALC COMMENT: Comment     Comment: See LDL Comment if reported.   Chol/HDL Ratio 7.8 (H) 0.0 - 4.4 ratio    Comment:                                   T. Chol/HDL Ratio                                             Men  Women                               1/2 Avg.Risk  3.4    3.3                                   Avg.Risk  5.0    4.4                                2X Avg.Risk  9.6    7.1                                3X Avg.Risk 23.4   11.0   Direct LDL     Status: Abnormal   Collection Time: 07/03/23  9:55 AM  Result Value Ref Range   LDL Direct 163 (H) 0 - 99 mg/dL  Comprehensive metabolic panel     Status: Abnormal   Collection Time: 07/03/23  9:55 AM  Result Value Ref Range   Glucose 126 (H) 70 - 99 mg/dL   BUN 25 (H) 6 - 24 mg/dL   Creatinine, Ser 9.52 (H) 0.57 - 1.00 mg/dL   eGFR 61 >84 XL/KGM/0.10   BUN/Creatinine Ratio 23 9 - 23   Sodium 141 134 - 144  mmol/L   Potassium 4.6 3.5 - 5.2 mmol/L   Chloride 102 96 - 106  mmol/L   CO2 24 20 - 29 mmol/L   Calcium 10.0 8.7 - 10.2 mg/dL   Total Protein 6.9 6.0 - 8.5 g/dL   Albumin 4.6 3.8 - 4.9 g/dL   Globulin, Total 2.3 1.5 - 4.5 g/dL   Bilirubin Total 0.4 0.0 - 1.2 mg/dL   Alkaline Phosphatase 62 44 - 121 IU/L   AST 23 0 - 40 IU/L   ALT 24 0 - 32 IU/L  CBC w/Diff/Platelet     Status: Abnormal   Collection Time: 07/03/23  9:55 AM  Result Value Ref Range   WBC 4.9 3.4 - 10.8 x10E3/uL   RBC 4.47 3.77 - 5.28 x10E6/uL    Comment: Few tear drops.   Hemoglobin 13.0 11.1 - 15.9 g/dL   Hematocrit 40.3 47.4 - 46.6 %   MCV 89 79 - 97 fL   MCH 29.1 26.6 - 33.0 pg   MCHC 32.6 31.5 - 35.7 g/dL   RDW 25.9 56.3 - 87.5 %   Platelets 95 (LL) 150 - 450 x10E3/uL    Comment: Actual platelet count may be somewhat higher than reported due to aggregation of platelets in this sample.    Neutrophils 75 Not Estab. %    Comment: Occasional metamyelocyte seen on scan.   Lymphs 18 Not Estab. %   Monocytes 4 Not Estab. %   Eos 1 Not Estab. %   Basos 1 Not Estab. %   Neutrophils Absolute 3.7 1.4 - 7.0 x10E3/uL   Lymphocytes Absolute 0.9 0.7 - 3.1 x10E3/uL   Monocytes Absolute 0.2 0.1 - 0.9 x10E3/uL   EOS (ABSOLUTE) 0.0 0.0 - 0.4 x10E3/uL   Basophils Absolute 0.0 0.0 - 0.2 x10E3/uL   Immature Granulocytes 1 Not Estab. %   Immature Grans (Abs) 0.0 0.0 - 0.1 x10E3/uL   Hematology Comments: Note:     Comment: Verified by microscopic examination.  VITAMIN D 25 Hydroxy (Vit-D Deficiency, Fractures)     Status: Abnormal   Collection Time: 07/03/23  9:55 AM  Result Value Ref Range   Vit D, 25-Hydroxy 16.8 (L) 30.0 - 100.0 ng/mL    Comment: Vitamin D deficiency has been defined by the Institute of Medicine and an Endocrine Society practice guideline as a level of serum 25-OH vitamin D less than 20 ng/mL (1,2). The Endocrine Society went on to further define vitamin D insufficiency as a level between 21 and 29 ng/mL (2). 1. IOM (Institute of Medicine). 2010. Dietary  reference    intakes for calcium and D. [place]: The    [employer]. 2. Holick MF, [place], Bischoff-Ferrari HA, et al.    Evaluation, treatment, and prevention of vitamin D    deficiency: an Endocrine Society clinical practice    guideline. JCEM. 2011 Jul; 96(7):1911-30.     Assessment/Plan:  Carotid stenosis The patient has undergone a CT angiogram which I have independently reviewed.  This is reported as a high-grade string sign stenosis in the right carotid artery with an occlusion of the left carotid artery.  I believe that to be fairly accurate.  I had a long discussion with the patient today regarding the serious nature of the situation and why it requires treatment.  She is at high risk of stroke.  Her syncopal episodes are very likely to be related to her significant carotid occlusive disease.  I discussed with her treatment options including carotid endarterectomy  and carotid artery stenting.  I discussed that with a contralateral occlusion, carotid stenting is generally preferred.  Also discussed that carotid endarterectomy would still be a reasonable option but with the contralateral occlusion that does carry a slightly higher risk of stroke.  Her anatomy seems to be reasonably acceptable for carotid artery stenting.  I have recommended going ahead and putting her on Plavix for 5 to 7 days prior to the procedure.  She is already on aspirin and a statin agent.  I have discussed the risks and benefits of carotid stenting.  I discussed the differences between carotid stenting, carotid endarterectomy, and TCAR.  Patient voices her understanding and is agreeable to proceed.      [name] 07/22/2023, 3:38 PM   This note was created with Dragon medical transcription system.  Any errors from dictation are unintentional.

## 2023-07-22 NOTE — Assessment & Plan Note (Signed)
The patient does describe symptoms that are worrisome for claudication.  We discussed be happy to perform evaluation for PAD, but I would recommend taking care of her carotid disease first.

## 2023-07-22 NOTE — Assessment & Plan Note (Signed)
blood pressure control important in reducing the progression of atherosclerotic disease. On appropriate oral medications.  

## 2023-07-22 NOTE — Patient Instructions (Signed)
Carotid Angioplasty With Stent Carotid angioplasty with stent is a procedure to open or widen an artery in the neck (carotid artery) that has become narrowed. This is done by inflating a small balloon inside the artery and then placing a small piece of metal that looks like a coil or spring (stent) inside the artery. The stent helps keep the artery open by supporting the artery walls. The carotid arteries supply blood to the brain. When fats, cholesterol, and other materials (plaque) build up in an artery, the artery becomes narrow and can become blocked. This can reduce or block blood flow to certain areas of the brain, which can cause serious health problems, including stroke. The stent helps to keep the artery open so that blood can flow to the brain. Tell a health care provider about: Any allergies you have. All medicines you are taking, including vitamins, herbs, eye drops, creams, and over-the-counter medicines. Any problems you or family members have had with anesthesia. Any bleeding problems you have. Any surgeries you have had. Any medical conditions you have. Whether you are pregnant or may be pregnant. What are the risks? Your health care provider will talk with you about risks. These may include: Stroke. The stent becoming blocked. Problems at the access site, such as a large amount of blood collecting under your skin (hematoma). Allergic reactions to medicines or dyes. Damage to other structures or organs, or to the carotid artery. Infection. Heart attack. Death. This is rare. What happens before the procedure? Follow instructions from your health care provider about what you may eat and drink. Ask your health care provider about: Changing or stopping your regular medicines. These include any diabetes medicines or blood thinners (anticoagulants) you take. Whether aspirin or other blood thinners are recommended before this procedure. Taking over-the-counter medicines, vitamins,  herbs, and supplements. Do not use any products that contain nicotine or tobacco for at least 4 weeks before the procedure. These products include cigarettes, chewing tobacco, and vaping devices, such as e-cigarettes. If you need help quitting, ask your health care provider. Ask your health care provider: How your surgery site will be marked. What steps will be taken to help prevent infection. These steps may include washing skin with a soap that kills germs. You may have blood tests and imaging tests. What happens during the procedure?  An IV will be inserted into one of your veins. You may be given: A sedative. This helps you relax. Anesthesia. This will: Numb certain areas of your body. Make you fall asleep for surgery. Most commonly, an incision will be made in your groin. In some cases, an incision may be made in your wrist or forearm instead of your groin. A small, thin tube (catheter) will be inserted through an incision and into an artery. The catheter will be threaded upward into your carotid artery. An X-ray machine will help your health care provider guide the catheter to the correct place in your artery. Dye will be injected into the catheter and will travel to the narrow or blocked part of your carotid artery. X-ray images will be taken of how the dye flows through your artery. While the images are being taken, you may be given instructions about breathing, swallowing, moving, or talking. A filter will be inserted into your artery. This will be used to catch plaque that comes loose in your artery during the procedure. This reduces the risk of plaque moving into your brain. A small balloon will be inserted into your artery.  The balloon will be inflated for a few seconds to widen your artery and will then be removed. The stent will be placed in your artery. A second small balloon will be inserted into your artery and inflated. This expands the stent inside of your artery so that the  stent holds the artery walls open. The second balloon will then be removed. The catheter, filter, and first balloon will be removed from your artery and pressure will be held on your carotid artery to stop bleeding. Your incision may be closed with stitches (sutures), skin glue, or adhesive strips. A bandage (dressing) will be placed over your incision. The procedure may vary among health care providers and hospitals. What happens after the procedure? Your blood pressure, heart rate, breathing rate, and blood oxygen level will be monitored until you leave the hospital or clinic. Your mental status and movements (neurological status) will be monitored. You may need to have pressure placed on the incision site to prevent bleeding. You will need to keep the area still for a few hours, or as long as told by your health care provider. If the procedure was done in your groin, you will be told not to bend or cross your legs. Most people stay in the hospital overnight. This information is not intended to replace advice given to you by your health care provider. Make sure you discuss any questions you have with your health care provider. Document Revised: 03/26/2022 Document Reviewed: 03/26/2022 Elsevier Patient Education  2024 [employer].

## 2023-07-25 ENCOUNTER — Telehealth (INDEPENDENT_AMBULATORY_CARE_PROVIDER_SITE_OTHER): Payer: Self-pay

## 2023-07-25 NOTE — Telephone Encounter (Signed)
Spoke with the patient and she is scheduled with Dr. Wyn Quaker on 08/04/23 with a 10:00 am arrival time to the [facility] for a right carotid stent placement. Pre-procedure instructions were discussed and will be sent to Mychart and mailed.

## 2023-07-30 ENCOUNTER — Ambulatory Visit
Admission: RE | Admit: 2023-07-30 | Discharge: 2023-07-30 | Disposition: A | Discharge status: Home / Self Care | Payer: PRIVATE HEALTH INSURANCE | Source: Ambulatory Visit | Attending: Family Medicine | Admitting: Family Medicine

## 2023-07-30 DIAGNOSIS — E119 Type 2 diabetes mellitus without complications: Secondary | ICD-10-CM | POA: Insufficient documentation

## 2023-07-30 DIAGNOSIS — I10 Essential (primary) hypertension: Secondary | ICD-10-CM | POA: Insufficient documentation

## 2023-07-30 DIAGNOSIS — R011 Cardiac murmur, unspecified: Secondary | ICD-10-CM | POA: Diagnosis present

## 2023-07-30 LAB — ECHOCARDIOGRAM COMPLETE
AR max vel: 1.95 cm2
AV Area VTI: 2.25 cm2
AV Area mean vel: 2.01 cm2
AV Mean grad: 6.3 mmHg
AV Peak grad: 10.5 mmHg
Ao pk vel: 1.62 m/s
Area-P 1/2: 4.06 cm2
MV VTI: 2.6 cm2
S' Lateral: 2.5 cm

## 2023-07-30 NOTE — Progress Notes (Signed)
*  PRELIMINARY RESULTS* Echocardiogram 2D Echocardiogram has been performed.  Cassandra Butler 07/30/2023, 9:56 AM

## 2023-08-04 ENCOUNTER — Other Ambulatory Visit: Payer: Self-pay

## 2023-08-04 ENCOUNTER — Inpatient Hospital Stay
Admission: RE | Admit: 2023-08-04 | Discharge: 2023-08-05 | DRG: 036 | Disposition: A | Discharge status: Home / Self Care | Payer: PRIVATE HEALTH INSURANCE | Attending: Vascular Surgery | Admitting: Vascular Surgery

## 2023-08-04 ENCOUNTER — Encounter
Admission: RE | Disposition: A | Discharge status: Home / Self Care | Payer: Self-pay | Source: Home / Self Care | Attending: Vascular Surgery

## 2023-08-04 ENCOUNTER — Encounter: Payer: Self-pay | Admitting: Vascular Surgery

## 2023-08-04 DIAGNOSIS — K219 Gastro-esophageal reflux disease without esophagitis: Secondary | ICD-10-CM | POA: Diagnosis present

## 2023-08-04 DIAGNOSIS — Q2549 Other congenital malformations of aorta: Secondary | ICD-10-CM

## 2023-08-04 DIAGNOSIS — Z8249 Family history of ischemic heart disease and other diseases of the circulatory system: Secondary | ICD-10-CM | POA: Diagnosis not present

## 2023-08-04 DIAGNOSIS — Z90722 Acquired absence of ovaries, bilateral: Secondary | ICD-10-CM | POA: Diagnosis not present

## 2023-08-04 DIAGNOSIS — E559 Vitamin D deficiency, unspecified: Secondary | ICD-10-CM | POA: Diagnosis present

## 2023-08-04 DIAGNOSIS — Z9889 Other specified postprocedural states: Secondary | ICD-10-CM

## 2023-08-04 DIAGNOSIS — Z7982 Long term (current) use of aspirin: Secondary | ICD-10-CM

## 2023-08-04 DIAGNOSIS — Z833 Family history of diabetes mellitus: Secondary | ICD-10-CM | POA: Diagnosis not present

## 2023-08-04 DIAGNOSIS — I10 Essential (primary) hypertension: Secondary | ICD-10-CM | POA: Diagnosis present

## 2023-08-04 DIAGNOSIS — Z90710 Acquired absence of both cervix and uterus: Secondary | ICD-10-CM | POA: Diagnosis not present

## 2023-08-04 DIAGNOSIS — Z853 Personal history of malignant neoplasm of breast: Secondary | ICD-10-CM | POA: Diagnosis not present

## 2023-08-04 DIAGNOSIS — F17210 Nicotine dependence, cigarettes, uncomplicated: Secondary | ICD-10-CM | POA: Diagnosis present

## 2023-08-04 DIAGNOSIS — E785 Hyperlipidemia, unspecified: Secondary | ICD-10-CM | POA: Diagnosis present

## 2023-08-04 DIAGNOSIS — I6521 Occlusion and stenosis of right carotid artery: Principal | ICD-10-CM | POA: Diagnosis present

## 2023-08-04 DIAGNOSIS — Z79899 Other long term (current) drug therapy: Secondary | ICD-10-CM

## 2023-08-04 DIAGNOSIS — Z823 Family history of stroke: Secondary | ICD-10-CM

## 2023-08-04 DIAGNOSIS — Z7902 Long term (current) use of antithrombotics/antiplatelets: Secondary | ICD-10-CM

## 2023-08-04 DIAGNOSIS — I6523 Occlusion and stenosis of bilateral carotid arteries: Secondary | ICD-10-CM | POA: Diagnosis not present

## 2023-08-04 DIAGNOSIS — Z7984 Long term (current) use of oral hypoglycemic drugs: Secondary | ICD-10-CM

## 2023-08-04 DIAGNOSIS — E119 Type 2 diabetes mellitus without complications: Secondary | ICD-10-CM | POA: Diagnosis present

## 2023-08-04 HISTORY — PX: CAROTID PTA/STENT INTERVENTION: CATH118231

## 2023-08-04 LAB — MRSA NEXT GEN BY PCR, NASAL: MRSA by PCR Next Gen: NOT DETECTED

## 2023-08-04 LAB — GLUCOSE, CAPILLARY
Glucose-Capillary: 217 mg/dL — ABNORMAL HIGH (ref 70–99)
Glucose-Capillary: 144 mg/dL — ABNORMAL HIGH (ref 70–99)
Glucose-Capillary: 154 mg/dL — ABNORMAL HIGH (ref 70–99)
Glucose-Capillary: 342 mg/dL — ABNORMAL HIGH (ref 70–99)
Glucose-Capillary: 306 mg/dL — ABNORMAL HIGH (ref 70–99)

## 2023-08-04 LAB — CREATININE, SERUM
Creatinine, Ser: 1.05 mg/dL — ABNORMAL HIGH (ref 0.44–1.00)
GFR, Estimated: >60 mL/min (ref >60)

## 2023-08-04 LAB — BUN: BUN: 18 mg/dL (ref 6–20)

## 2023-08-04 LAB — POCT ACTIVATED CLOTTING TIME: Activated Clotting Time: 299 seconds

## 2023-08-04 SURGERY — CAROTID PTA/STENT INTERVENTION
Anesthesia: Moderate Sedation | Laterality: Right

## 2023-08-04 MED ORDER — INSULIN ASPART 100 UNIT/ML IJ SOLN
0.0000 [IU] | Freq: Three times a day (TID) | INTRAMUSCULAR | Status: DC
  Administered 2023-08-05: 3 [IU] via SUBCUTANEOUS
  Filled 2023-08-04: qty 1

## 2023-08-04 MED ORDER — FOLIC ACID 1 MG PO TABS
1.0000 mg | ORAL_TABLET | Freq: Every day | ORAL | Status: DC
  Administered 2023-08-04: 1 mg via ORAL
  Filled 2023-08-04: qty 1

## 2023-08-04 MED ORDER — HEPARIN SODIUM (PORCINE) 1000 UNIT/ML IJ SOLN
INTRAMUSCULAR | Status: DC | PRN
  Administered 2023-08-04: 8000 [IU] via INTRAVENOUS

## 2023-08-04 MED ORDER — ASPIRIN 81 MG PO CHEW
81.0000 mg | CHEWABLE_TABLET | Freq: Every day | ORAL | Status: DC
  Administered 2023-08-04: 81 mg via ORAL
  Filled 2023-08-04: qty 1

## 2023-08-04 MED ORDER — SODIUM CHLORIDE 0.9 % IV SOLN
500.0000 mL | Freq: Once | INTRAVENOUS | Status: AC | PRN
  Administered 2023-08-04: 500 mL via INTRAVENOUS

## 2023-08-04 MED ORDER — ONDANSETRON HCL 4 MG/2ML IJ SOLN: 4.0000 mg | Freq: Four times a day (QID) | INTRAMUSCULAR | Status: DC | PRN

## 2023-08-04 MED ORDER — FENTANYL CITRATE (PF) 100 MCG/2ML IJ SOLN
INTRAMUSCULAR | Status: DC | PRN
  Administered 2023-08-04: 50 ug via INTRAVENOUS

## 2023-08-04 MED ORDER — IODIXANOL 320 MG/ML IV SOLN
INTRAVENOUS | Status: DC | PRN
  Administered 2023-08-04: 60 mL

## 2023-08-04 MED ORDER — METOPROLOL TARTRATE 5 MG/5ML IV SOLN: 2.0000 mg | INTRAVENOUS | Status: DC | PRN

## 2023-08-04 MED ORDER — CLOPIDOGREL BISULFATE 75 MG PO TABS
75.0000 mg | ORAL_TABLET | Freq: Every day | ORAL | Status: DC
  Administered 2023-08-04: 75 mg via ORAL
  Filled 2023-08-04: qty 1

## 2023-08-04 MED ORDER — FAMOTIDINE 20 MG PO TABS: 40.0000 mg | ORAL_TABLET | Freq: Once | ORAL | Status: DC | PRN

## 2023-08-04 MED ORDER — MIDAZOLAM HCL 2 MG/ML PO SYRP: 8.0000 mg | ORAL_SOLUTION | Freq: Once | ORAL | Status: DC | PRN

## 2023-08-04 MED ORDER — METHYLPREDNISOLONE SODIUM SUCC 125 MG IJ SOLR: 125.0000 mg | Freq: Once | INTRAMUSCULAR | Status: DC | PRN

## 2023-08-04 MED ORDER — LIDOCAINE-EPINEPHRINE (PF) 1 %-1:200000 IJ SOLN
INTRAMUSCULAR | Status: DC | PRN
  Administered 2023-08-04: 10 mL

## 2023-08-04 MED ORDER — SODIUM CHLORIDE 0.9 % IV SOLN: INTRAVENOUS | Status: DC

## 2023-08-04 MED ORDER — CEFAZOLIN SODIUM-DEXTROSE 2-4 GM/100ML-% IV SOLN
2.0000 g | Freq: Three times a day (TID) | INTRAVENOUS | Status: AC
  Administered 2023-08-04 – 2023-08-05 (×2): 2 g via INTRAVENOUS
  Filled 2023-08-04 (×3): qty 100

## 2023-08-04 MED ORDER — CEFAZOLIN SODIUM-DEXTROSE 2-4 GM/100ML-% IV SOLN
INTRAVENOUS | Status: AC
  Filled 2023-08-04: qty 100

## 2023-08-04 MED ORDER — MIDAZOLAM HCL 2 MG/2ML IJ SOLN
INTRAMUSCULAR | Status: AC
  Filled 2023-08-04: qty 2

## 2023-08-04 MED ORDER — HYDRALAZINE HCL 20 MG/ML IJ SOLN: 5.0000 mg | INTRAMUSCULAR | Status: DC | PRN

## 2023-08-04 MED ORDER — CEFAZOLIN SODIUM-DEXTROSE 2-4 GM/100ML-% IV SOLN
2.0000 g | INTRAVENOUS | Status: AC
  Administered 2023-08-04: 2 g via INTRAVENOUS
  Filled 2023-08-04: qty 100

## 2023-08-04 MED ORDER — SERTRALINE HCL 100 MG PO TABS
100.0000 mg | ORAL_TABLET | Freq: Every day | ORAL | Status: DC
  Administered 2023-08-04: 100 mg via ORAL
  Filled 2023-08-04: qty 2
  Filled 2023-08-04: qty 1

## 2023-08-04 MED ORDER — HEPARIN (PORCINE) IN NACL 1000-0.9 UT/500ML-% IV SOLN
INTRAVENOUS | Status: DC | PRN
  Administered 2023-08-04: 2000 mL

## 2023-08-04 MED ORDER — MIDAZOLAM HCL 2 MG/2ML IJ SOLN
INTRAMUSCULAR | Status: DC | PRN
  Administered 2023-08-04: 1 mg via INTRAVENOUS

## 2023-08-04 MED ORDER — ATROPINE SULFATE 1 MG/ML IV SOLN
INTRAVENOUS | Status: DC | PRN
  Administered 2023-08-04: 1 mg via INTRAVENOUS

## 2023-08-04 MED ORDER — DIPHENHYDRAMINE HCL 50 MG/ML IJ SOLN: 50.0000 mg | Freq: Once | INTRAMUSCULAR | Status: DC | PRN

## 2023-08-04 MED ORDER — OXYCODONE-ACETAMINOPHEN 5-325 MG PO TABS: 1.0000 | ORAL_TABLET | ORAL | Status: DC | PRN

## 2023-08-04 MED ORDER — ACETAMINOPHEN 325 MG RE SUPP: 325.0000 mg | RECTAL | Status: DC | PRN

## 2023-08-04 MED ORDER — PANTOPRAZOLE SODIUM 40 MG PO TBEC
40.0000 mg | DELAYED_RELEASE_TABLET | Freq: Every day | ORAL | Status: DC
  Administered 2023-08-04: 40 mg via ORAL
  Filled 2023-08-04: qty 1

## 2023-08-04 MED ORDER — POTASSIUM CHLORIDE CRYS ER 20 MEQ PO TBCR: 20.0000 meq | EXTENDED_RELEASE_TABLET | Freq: Every day | ORAL | Status: DC | PRN

## 2023-08-04 MED ORDER — HEPARIN SODIUM (PORCINE) 1000 UNIT/ML IJ SOLN
INTRAMUSCULAR | Status: AC
  Filled 2023-08-04: qty 20

## 2023-08-04 MED ORDER — METHOTREXATE SODIUM 2.5 MG PO TABS
15.0000 mg | ORAL_TABLET | ORAL | Status: DC
  Filled 2023-08-04: qty 6

## 2023-08-04 MED ORDER — ATROPINE SULFATE 1 MG/10ML IJ SOSY
PREFILLED_SYRINGE | INTRAMUSCULAR | Status: AC
  Filled 2023-08-04: qty 10

## 2023-08-04 MED ORDER — ALUM & MAG HYDROXIDE-SIMETH 200-200-20 MG/5ML PO SUSP: 15.0000 mL | ORAL | Status: DC | PRN

## 2023-08-04 MED ORDER — MORPHINE SULFATE (PF) 4 MG/ML IV SOLN: 2.0000 mg | INTRAVENOUS | Status: DC | PRN

## 2023-08-04 MED ORDER — FENTANYL CITRATE (PF) 100 MCG/2ML IJ SOLN
INTRAMUSCULAR | Status: AC
  Filled 2023-08-04: qty 2

## 2023-08-04 MED ORDER — PHENYLEPHRINE 80 MCG/ML (10ML) SYRINGE FOR IV PUSH (FOR BLOOD PRESSURE SUPPORT)
PREFILLED_SYRINGE | INTRAVENOUS | Status: AC
  Filled 2023-08-04: qty 10

## 2023-08-04 MED ORDER — LABETALOL HCL 5 MG/ML IV SOLN: 10.0000 mg | INTRAVENOUS | Status: DC | PRN

## 2023-08-04 MED ORDER — COENZYME Q10 30 MG PO CAPS: 30.0000 mg | ORAL_CAPSULE | Freq: Three times a day (TID) | ORAL | Status: DC

## 2023-08-04 MED ORDER — DOPAMINE-DEXTROSE 3.2-5 MG/ML-% IV SOLN
2.0000 ug/kg/min | INTRAVENOUS | Status: DC
  Administered 2023-08-04: 5 ug/kg/min via INTRAVENOUS

## 2023-08-04 MED ORDER — GUAIFENESIN-DM 100-10 MG/5ML PO SYRP: 15.0000 mL | ORAL_SOLUTION | ORAL | Status: DC | PRN

## 2023-08-04 MED ORDER — DOPAMINE-DEXTROSE 3.2-5 MG/ML-% IV SOLN
INTRAVENOUS | Status: AC
  Filled 2023-08-04: qty 250

## 2023-08-04 MED ORDER — INSULIN ASPART 100 UNIT/ML IJ SOLN
0.0000 [IU] | Freq: Every day | INTRAMUSCULAR | Status: DC
  Administered 2023-08-04: 4 [IU] via SUBCUTANEOUS
  Filled 2023-08-04: qty 1

## 2023-08-04 MED ORDER — MAGNESIUM SULFATE 2 GM/50ML IV SOLN: 2.0000 g | Freq: Every day | INTRAVENOUS | Status: DC | PRN

## 2023-08-04 MED ORDER — FAMOTIDINE IN NACL 20-0.9 MG/50ML-% IV SOLN
20.0000 mg | Freq: Two times a day (BID) | INTRAVENOUS | Status: DC
  Administered 2023-08-05: 20 mg via INTRAVENOUS
  Filled 2023-08-04: qty 50

## 2023-08-04 MED ORDER — HYDROMORPHONE HCL 1 MG/ML IJ SOLN: 1.0000 mg | Freq: Once | INTRAMUSCULAR | Status: DC | PRN

## 2023-08-04 MED ORDER — ACETAMINOPHEN 325 MG PO TABS
ORAL_TABLET | ORAL | Status: AC
  Administered 2023-08-04: 650 mg via ORAL
  Filled 2023-08-04: qty 2

## 2023-08-04 MED ORDER — PHENOL 1.4 % MT LIQD: 1.0000 | OROMUCOSAL | Status: DC | PRN

## 2023-08-04 MED ORDER — PHENYLEPHRINE HCL-NACL 20-0.9 MG/250ML-% IV SOLN
INTRAVENOUS | Status: AC
  Filled 2023-08-04: qty 250

## 2023-08-04 MED ORDER — ACETAMINOPHEN 325 MG PO TABS
325.0000 mg | ORAL_TABLET | ORAL | Status: DC | PRN
  Administered 2023-08-04: 650 mg via ORAL
  Filled 2023-08-04: qty 2

## 2023-08-04 MED ORDER — ROSUVASTATIN CALCIUM 5 MG PO TABS
5.0000 mg | ORAL_TABLET | Freq: Every day | ORAL | Status: DC
  Administered 2023-08-04: 5 mg via ORAL
  Filled 2023-08-04: qty 1

## 2023-08-04 MED ORDER — FENOFIBRATE 54 MG PO TABS
54.0000 mg | ORAL_TABLET | Freq: Every day | ORAL | Status: DC
  Administered 2023-08-04: 54 mg via ORAL
  Filled 2023-08-04: qty 1

## 2023-08-04 SURGICAL SUPPLY — 21 items
BALLN VTRAC 4.5X30X135 (BALLOONS) ×1
BALLOON VTRAC 4.5X30X135 (BALLOONS) IMPLANT
CATH ANGIO 5F PIGTAIL 100CM (CATHETERS) IMPLANT
CATH BEACON 5 .035 100 H1 TIP (CATHETERS) IMPLANT
COVER DRAPE FLUORO 36X44 (DRAPES) IMPLANT
COVER PROBE ULTRASOUND 5X96 (MISCELLANEOUS) IMPLANT
DEVICE EMBOSHIELD NAV6 4.0-7.0 (FILTER) IMPLANT
DEVICE STARCLOSE SE CLOSURE (Vascular Products) IMPLANT
DEVICE TORQUE (MISCELLANEOUS) IMPLANT
GLIDEWIRE ANGLED SS 035X260CM (WIRE) IMPLANT
KIT CAROTID MANIFOLD (MISCELLANEOUS) IMPLANT
KIT ENCORE 26 ADVANTAGE (KITS) IMPLANT
PACK ANGIOGRAPHY (CUSTOM PROCEDURE TRAY) ×1 IMPLANT
SHEATH BRITE TIP 6FRX11 (SHEATH) IMPLANT
SHEATH SHUTTLE 6FRX80 (SHEATH) IMPLANT
STENT XACT CAR 9-7X40X136 (Permanent Stent) IMPLANT
STENT XACT CAR 9X30X136 (Permanent Stent) IMPLANT
SUT MNCRL AB 4-0 PS2 18 (SUTURE) IMPLANT
SYR MEDRAD MARK 7 150ML (SYRINGE) IMPLANT
WIRE G VAS 035X260 STIFF (WIRE) IMPLANT
WIRE GUIDERIGHT .035X150 (WIRE) IMPLANT

## 2023-08-04 NOTE — Interval H&P Note (Signed)
History and Physical Interval Note:  08/04/2023 10:08 AM  Cassandra Butler  has presented today for surgery, with the diagnosis of R Carotid Stent   ABBOTT    Carotid artery stenosis.  The various methods of treatment have been discussed with the patient and family. After consideration of risks, benefits and other options for treatment, the patient has consented to  Procedure(s): CAROTID PTA/STENT INTERVENTION (Right) as a surgical intervention.  The patient's history has been reviewed, patient examined, no change in status, stable for surgery.  I have reviewed the patient's chart and labs.  Questions were answered to the patient's satisfaction.     [name]

## 2023-08-04 NOTE — Progress Notes (Signed)
Dr. Wyn Quaker in at bedside, speaking with pt. And her spouse/family re: procedural results. All involved verbalized understanding of conversation. Await ICU bed.

## 2023-08-04 NOTE — Progress Notes (Signed)
PHARMACIST - PHYSICIAN ORDER COMMUNICATION  CONCERNING: P&T Medication Policy on Herbal Medications  DESCRIPTION:  This patient's order for:  Coenzyme Q-10  has been noted.  This product(s) is classified as an "herbal" or natural product. Due to a lack of definitive safety studies or FDA approval, nonstandard manufacturing practices, plus the potential risk of unknown drug-drug interactions while on inpatient medications, the Pharmacy and Therapeutics Committee does not permit the use of "herbal" or natural products of this type within [facility]   ACTION TAKEN: The pharmacy department is unable to verify this order at this time. Please reevaluate patient's clinical condition at discharge and address if the herbal or natural product(s) should be resumed at that time.      Bari Mantis PharmD Clinical Pharmacist 08/04/2023

## 2023-08-04 NOTE — Op Note (Signed)
OPERATIVE NOTE DATE: 08/04/2023  PROCEDURE:  Ultrasound guidance for vascular access right femoral artery  Placement of a 9 mm proximal, 7 mm distal, 4 cm long exact stent as well as a 9 mm diameter by 3 cm length proximal extension stent with the use of the NAV-6 embolic protection device in the right carotid artery  PRE-OPERATIVE DIAGNOSIS: 1.  High-grade right carotid artery stenosis. 2.  Occluded left carotid artery  POST-OPERATIVE DIAGNOSIS:  Same as above  SURGEON: [name], MD  ASSISTANT(S): None  ANESTHESIA: local/MCS  ESTIMATED BLOOD LOSS: 25 cc  CONTRAST: 60 cc  FLUORO TIME: 5.1 minutes  MODERATE CONSCIOUS SEDATION TIME:  Approximately 52 minutes using 1 mg of Versed and 50 mcg of Fentanyl  FINDING(S): 1.   90% right carotid artery stenosis  SPECIMEN(S):   none  INDICATIONS:   Patient is a 57 y.o. female who presents with high-grade right carotid artery stenosis.  The patient has a left carotid artery occlusion and carotid artery stenting was felt to be preferred to endarterectomy for that reason. I have completed Share Decision Making with Brain Hilts prior to surgery.  Conversations included: -Discussion of all treatment options including carotid endarterectomy (CEA), CAS (which includes transcarotid artery revascularization (TCAR)), and optimal medical therapy (OMT)). -Explanation of risks and benefits for each option specific to [facility] clinical situation. -Integration of clinical guidelines as it relates to the patient's history and co-morbidities -Discussion and incorporation of Brain Hilts and their personal preferences and priorities in choosing a treatment plan.  Patient was able to participate in Shared Decision Making this process.   Risks and benefits were discussed and informed consent was obtained.   DESCRIPTION: After obtaining full informed written consent, the patient was brought back to the vascular suite and placed  supine upon the table.  The patient received IV antibiotics prior to induction. Moderate conscious sedation was administered during a face to face encounter with the patient throughout the procedure with my supervision of the RN administering medicines and monitoring the patients vital signs and mental status throughout from the start of the procedure until the patient was taken to the recovery room.  After obtaining adequate anesthesia, the patient was prepped and draped in the standard fashion.   The right femoral artery was visualized with ultrasound and found to be widely patent. It was then accessed under direct ultrasound guidance without difficulty with a Seldinger needle. A permanent image was recorded. A J-wire was placed and we then placed a 6 French sheath. The patient was then heparinized and a total of 8000 units of intravenous heparin were given and an ACT was checked to confirm successful anticoagulation. A pigtail catheter was then placed into the ascending aorta. This showed a type I, bovine aortic arch configuration with mild calcific changes but no high-grade stenosis of the innominate artery. I then selectively cannulated the innominate artery without difficulty with a headhunter catheter and advanced into the mid right common carotid artery.  Cervical and cerebral carotid angiography was then performed. There were no obvious intracranial filling defects with complete right-to-left cross-filling. The carotid bifurcation demonstrated a long lesion starting in the distal common carotid artery and extending beyond the carotid bifurcation with the tightest area being about 1 cm into the internal carotid artery and measuring approximately 90%.  This had moderate calcification.  I then advanced into the external carotid artery with a Glidewire and the headhunter catheter and then exchanged for the Amplatz Super  Stiff wire. Over the Amplatz Super Stiff wire, a 6 [place] shuttle sheath was placed into the  mid common carotid artery. I then used the NAV-6  Embolic protection device and crossed the lesion and parked this in the distal internal carotid artery at the base of the skull.  I then selected a 9 mm proximal, 7 mm distal, 4 cm long exact stent. This was deployed across the lesion but was not long enough to encompass it in its entirety. A an additional 9 mm diameter by 3 cm long extension stent was placed proximally into the common carotid artery.  A 4.5 mm diameter by 3 cm length balloon was used to post dilate the stent. Only about a 20% residual stenosis was present after angioplasty. Completion angiogram showed normal intracranial filling without new defects. At this point I elected to terminate the procedure. The sheath was removed and StarClose closure device was deployed in the right femoral artery with excellent hemostatic result. The patient was taken to the recovery room in stable condition having tolerated the procedure well.  COMPLICATIONS: none  CONDITION: stable  [name] 08/04/2023 1:23 PM   This note was created with Dragon Medical transcription system. Any errors in dictation are purely unintentional.

## 2023-08-05 ENCOUNTER — Encounter: Payer: Self-pay | Admitting: Vascular Surgery

## 2023-08-05 LAB — BASIC METABOLIC PANEL
Anion gap: 9 (ref 5–15)
BUN: 19 mg/dL (ref 6–20)
CO2: 22 mmol/L (ref 22–32)
Calcium: 8.5 mg/dL — ABNORMAL LOW (ref 8.9–10.3)
Chloride: 107 mmol/L (ref 98–111)
Creatinine, Ser: 1.12 mg/dL — ABNORMAL HIGH (ref 0.44–1.00)
GFR, Estimated: 58 mL/min — ABNORMAL LOW (ref >60)
Glucose, Bld: 207 mg/dL — ABNORMAL HIGH (ref 70–99)
Potassium: 4 mmol/L (ref 3.5–5.1)
Sodium: 138 mmol/L (ref 135–145)

## 2023-08-05 LAB — CBC
HCT: 30.8 % — ABNORMAL LOW (ref 36.0–46.0)
Hemoglobin: 10.5 g/dL — ABNORMAL LOW (ref 12.0–15.0)
MCH: 30.4 pg (ref 26.0–34.0)
MCHC: 34.1 g/dL (ref 30.0–36.0)
MCV: 89.3 fL (ref 80.0–100.0)
Platelets: 72 10*3/uL — ABNORMAL LOW (ref 150–400)
RBC: 3.45 MIL/uL — ABNORMAL LOW (ref 3.87–5.11)
RDW: 14.2 % (ref 11.5–15.5)
WBC: 3.4 10*3/uL — ABNORMAL LOW (ref 4.0–10.5)
nRBC: 0 % (ref 0.0–0.2)

## 2023-08-05 LAB — GLUCOSE, CAPILLARY: Glucose-Capillary: 180 mg/dL — ABNORMAL HIGH (ref 70–99)

## 2023-08-05 NOTE — Discharge Summary (Addendum)
[facility]    Discharge Summary    Patient ID:  Cassandra Butler MRN: [id number] DOB/AGE: 11-12-1965 57 y.o.  Admit date: 08/04/2023 Discharge date: 08/05/2023 Date of Surgery: 08/04/2023 Surgeon: Surgeon(s): Annice Needy, MD  Admission Diagnosis: Carotid stenosis, right [I65.21]  Discharge Diagnoses:  Carotid stenosis, right [I65.21]  Secondary Diagnoses: Past Medical History:  Diagnosis Date   Cancer (HCC) 11/14/2007   Left breast wide excision, sentinel node biopsy. 1.4 cm, T1c, N0: ER 90%; PR 905; Her 2 neu not over expressed.    Diabetes mellitus without complication (HCC)    GERD (gastroesophageal reflux disease)    Hyperlipidemia    Hypertension    Vitamin D deficiency     Procedure(s): CAROTID PTA/STENT INTERVENTION  Discharged Condition: good  HPI:  Cassandra Butler is a 57 yo female now POD#1 from right Endovascular carotid stent placement.  Patient is recovering as expected.  Patient's right groin was accessed for the procedure.  Dressing is clean dry and intact.  No hematoma seroma to note.  No complications to note.  Patient denies any chest pain, shortness of breath, dizziness or blurred vision this morning.  Patient ambulated around the unit without any complications.  Patient is eating and urinating well.  Patient to be discharged later today.  Patient to be discharged on dual anticoagulation therapy.  Patient will go home on 81 mg aspirin daily, Plavix 75 mg daily, and Crestor 5 mg daily.  Hospital Course:  Cassandra Butler is a 57 y.o. female is S/P Right Extubated: POD # 0 Physical Exam:  Alert notes x3, no acute distress Face: Symmetrical.  Tongue is midline. Neck: Trachea is midline.  No swelling or bruising. Cardiovascular: Regular rate and rhythm Pulmonary: Clear to auscultation bilaterally Abdomen: Soft, nontender, nondistended Right groin access: Clean dry and intact.  No swelling or drainage noted Left groin  access: Clean dry and intact.  No swelling or drainage noted Left lower extremity: Thigh soft.  Calf soft.  Extremities warm distally toes.  Hard to palpate pedal pulses however the foot is warm is her good capillary refill. Right lower extremity: Thigh soft.  Calf soft.  Extremities warm distally toes.  Hard to palpate pedal pulses however the foot is warm is her good capillary refill. Neurological: No deficits noted   Post-op wounds:  clean, dry, intact or healing well  Pt. Ambulating, voiding and taking PO diet without difficulty. Pt pain controlled with PO pain meds.  Labs:  As below  Complications: none  Consults:    Significant Diagnostic Studies: CBC Lab Results  Component Value Date   WBC 3.4 (L) 08/05/2023   HGB 10.5 (L) 08/05/2023   HCT 30.8 (L) 08/05/2023   MCV 89.3 08/05/2023   PLT 72 (L) 08/05/2023    BMET    Component Value Date/Time   NA 138 08/05/2023 0348   NA 141 07/03/2023 0955   NA 136 02/17/2015 1423   K 4.0 08/05/2023 0348   K 4.1 02/17/2015 1423   CL 107 08/05/2023 0348   CL 102 02/17/2015 1423   CO2 22 08/05/2023 0348   CO2 28 02/17/2015 1423   GLUCOSE 207 (H) 08/05/2023 0348   GLUCOSE 139 (H) 02/17/2015 1423   BUN 19 08/05/2023 0348   BUN 25 (H) 07/03/2023 0955   BUN 12 02/17/2015 1423   CREATININE 1.12 (H) 08/05/2023 0348   CREATININE 0.88 02/17/2015 1423   CALCIUM 8.5 (L) 08/05/2023 0348   CALCIUM 9.3 02/17/2015  1423   GFRNONAA 58 (L) 08/05/2023 0348   GFRNONAA >60 02/17/2015 1423   GFRAA >60 10/12/2019 1455   GFRAA >60 02/17/2015 1423   COAG Lab Results  Component Value Date   INR 1.07 01/27/2017     Disposition:  Discharge to :Home  Allergies as of 08/05/2023   No Known Allergies      Medication List     TAKE these medications    aspirin 81 MG chewable tablet Chew by mouth daily.   clopidogrel 75 MG tablet Commonly known as: PLAVIX Take 1 tablet (75 mg total) by mouth daily.   co-enzyme Q-10 30 MG  capsule Take 1 capsule (30 mg total) by mouth 3 (three) times daily.   fenofibrate 145 MG tablet Commonly known as: Tricor Take 1 tablet (145 mg total) by mouth daily.   folic acid 1 MG tablet Commonly known as: FOLVITE Take 1 mg by mouth daily.   metFORMIN 500 MG 24 hr tablet Commonly known as: GLUCOPHAGE-XR Take 1 tablet (500 mg total) by mouth daily with supper.   methotrexate 2.5 MG tablet Commonly known as: RHEUMATREX Take 15 mg by mouth once a week.   omeprazole 20 MG capsule Commonly known as: PRILOSEC TAKE 1 CAPSULE(20 MG) BY MOUTH DAILY   ONE TOUCH ULTRA 2 w/Device Kit Test blood glucose once daily.   OneTouch Delica Lancets 33G Misc Use as directed   OneTouch Ultra test strip Generic drug: glucose blood Use as instructed   rosuvastatin 5 MG tablet Commonly known as: Crestor Take 1 tablet (5 mg total) by mouth daily.   sertraline 100 MG tablet Commonly known as: ZOLOFT Take 1 tablet (100 mg total) by mouth daily. TAKE 1 TABLET(100 MG) BY MOUTH DAILY   Vitamin D3 50 MCG (2000 UT) Tabs Take by mouth.       Verbal and written Discharge instructions given to the patient. Wound care per Discharge AVS  Follow-up Information     Dew, Marlow Baars, MD Follow up in 1 month(s).   Specialties: Vascular Surgery, Radiology, Interventional Cardiology Why: Carotid Ultrasound Contact information: [address] [place] [address] [phone]                 Signed: Marcie Bal, NP  08/05/2023, 8:09 AM

## 2023-08-05 NOTE — Plan of Care (Signed)

## 2023-08-05 NOTE — Progress Notes (Addendum)
Cassandra Butler to be D/C'd Home per MD order.  Discussed prescriptions and follow up appointments with the patient. NO Prescriptions given to patient, medication list explained in detail. Pt verbalized understanding. Stent card already given to patient by md  Allergies as of 08/05/2023   No Known Allergies      Medication List     TAKE these medications    aspirin 81 MG chewable tablet Chew by mouth daily.   clopidogrel 75 MG tablet Commonly known as: PLAVIX Take 1 tablet (75 mg total) by mouth daily.   co-enzyme Q-10 30 MG capsule Take 1 capsule (30 mg total) by mouth 3 (three) times daily.   fenofibrate 145 MG tablet Commonly known as: Tricor Take 1 tablet (145 mg total) by mouth daily.   folic acid 1 MG tablet Commonly known as: FOLVITE Take 1 mg by mouth daily.   metFORMIN 500 MG 24 hr tablet Commonly known as: GLUCOPHAGE-XR Take 1 tablet (500 mg total) by mouth daily with supper.   methotrexate 2.5 MG tablet Commonly known as: RHEUMATREX Take 15 mg by mouth once a week.   omeprazole 20 MG capsule Commonly known as: PRILOSEC TAKE 1 CAPSULE(20 MG) BY MOUTH DAILY   ONE TOUCH ULTRA 2 w/Device Kit Test blood glucose once daily.   OneTouch Delica Lancets 33G Misc Use as directed   OneTouch Ultra test strip Generic drug: glucose blood Use as instructed   rosuvastatin 5 MG tablet Commonly known as: Crestor Take 1 tablet (5 mg total) by mouth daily.   sertraline 100 MG tablet Commonly known as: ZOLOFT Take 1 tablet (100 mg total) by mouth daily. TAKE 1 TABLET(100 MG) BY MOUTH DAILY   Vitamin D3 50 MCG (2000 UT) Tabs Take by mouth.        Vitals:   08/05/23 1000 08/05/23 1030  BP: (!) 105/47 100/83  Pulse: 68 75  Resp: 18 17  Temp:    SpO2: 96% 100%    Skin clean, dry and intact without evidence of skin break down, no evidence of skin tears noted. IV catheter discontinued intact. Site without signs and symptoms of complications. Dressing and  pressure applied. Pt denies pain at this time. No complaints noted. Dressing to right groin clean, dry and intact  An After Visit Summary was printed and given to the patient. Patient escorted via WC, and D/C home via private auto.  Cassandra Butler Cassandra Butler

## 2023-08-05 NOTE — Plan of Care (Signed)
  Problem: Education: Goal: Knowledge of General Education information will improve Description Including pain rating scale, medication(s)/side effects and non-pharmacologic comfort measures Outcome: Progressing   

## 2023-08-05 NOTE — Progress Notes (Signed)
Transition of Care [facility]) - Inpatient Brief Assessment   Patient Details  Name: Cassandra Butler MRN: [id number] Date of Birth: 1966-07-08  Transition of Care [facility]) CM/SW Contact:    Truddie Hidden, RN Phone Number: 08/05/2023, 10:06 AM   Clinical Narrative: TOC continues ongoing assessments for any needs that may arise.     Transition of Care Asessment: Insurance and Status: Insurance coverage has been reviewed Patient has primary care physician: Yes Home environment has been reviewed: home Prior level of function:: independent Prior/Current Home Services: No current home services Social Determinants of Health Reivew: SDOH reviewed no interventions necessary Readmission risk has been reviewed: Yes Transition of care needs: no transition of care needs at this time

## 2023-08-05 NOTE — Discharge Instructions (Signed)
No heavy lifting until seen in follow-up clinic.  Do not lift anything more than a gallon of milk.  You may shower tomorrow.  Leave the old bandage in place while showering and remove immediately after showering.  Pat your right groin clean and dry as possible and cover incision site with Band-Aid for the next 3 days.  No driving for 1 week.  You are required to take aspirin 81 mg daily, Plavix 75 mg daily and your statin which is your anticholesterol medication daily.  Please do not miss taking any of these medications as they may cause an occlusion of your stent in your right carotid.  Follow-up with vascular surgery clinic as scheduled.

## 2023-08-20 ENCOUNTER — Other Ambulatory Visit: Payer: BC Managed Care – PPO | Admitting: Pharmacist

## 2023-08-20 ENCOUNTER — Telehealth: Payer: Self-pay | Admitting: Pharmacist

## 2023-08-20 NOTE — Progress Notes (Signed)
08/20/2023  Patient ID: Cassandra Butler, female   DOB: November 28, 1965, 58 y.o.   MRN: [id number]   Outreach to patient by telephone today as scheduled for follow up.  Patient denies need for further follow up. Reports medication cost concerns now resolved as she has new insurance.    Estelle Grumbles, PharmD, [facility] [phone]

## 2023-09-04 ENCOUNTER — Other Ambulatory Visit (INDEPENDENT_AMBULATORY_CARE_PROVIDER_SITE_OTHER): Payer: Self-pay | Admitting: Vascular Surgery

## 2023-09-04 DIAGNOSIS — I6523 Occlusion and stenosis of bilateral carotid arteries: Secondary | ICD-10-CM

## 2023-09-05 ENCOUNTER — Encounter (INDEPENDENT_AMBULATORY_CARE_PROVIDER_SITE_OTHER): Payer: Self-pay | Admitting: Nurse Practitioner

## 2023-09-05 ENCOUNTER — Ambulatory Visit (INDEPENDENT_AMBULATORY_CARE_PROVIDER_SITE_OTHER): Payer: PRIVATE HEALTH INSURANCE

## 2023-09-05 ENCOUNTER — Ambulatory Visit (INDEPENDENT_AMBULATORY_CARE_PROVIDER_SITE_OTHER): Payer: PRIVATE HEALTH INSURANCE | Admitting: Nurse Practitioner

## 2023-09-05 VITALS — BP 121/78 | HR 93 | Resp 18 | Ht 67.0 in | Wt 167.2 lb

## 2023-09-05 DIAGNOSIS — E1159 Type 2 diabetes mellitus with other circulatory complications: Secondary | ICD-10-CM

## 2023-09-05 DIAGNOSIS — I6523 Occlusion and stenosis of bilateral carotid arteries: Secondary | ICD-10-CM

## 2023-09-05 DIAGNOSIS — E1165 Type 2 diabetes mellitus with hyperglycemia: Secondary | ICD-10-CM

## 2023-09-05 DIAGNOSIS — I152 Hypertension secondary to endocrine disorders: Secondary | ICD-10-CM

## 2023-09-07 NOTE — Progress Notes (Signed)
Subjective:    Patient ID: Cassandra Butler, female    DOB: November 16, 1965, 57 y.o.   MRN: [id number] Chief Complaint  Patient presents with   Follow-up    1 month follow up with carotid    The patient is seen for follow up evaluation of carotid stenosis status post right carotid stent 08/04/2023.  There were no post operative problems or complications related to the surgery.  The patient denies neck or incisional pain.  She has had some headaches postprocedure and also notes that there is some popping in her ears.  The dizziness and syncopal episodes that she was having prior to stent placement have stopped.  The patient denies interval amaurosis fugax. There is no recent history of TIA symptoms or focal motor deficits. There is no prior documented CVA.  The patient denies headache.  The patient is taking enteric-coated aspirin 81 mg daily.  No recent shortening of the patient's walking distance or new symptoms consistent with claudication.  No history of rest pain symptoms. No new ulcers or wounds of the lower extremities have occurred.  There is no history of DVT, PE or superficial thrombophlebitis. No recent episodes of angina or shortness of breath documented.   Today noninvasive studies show a widely patent right carotid stent however the velocities are suggestive of a 50 to 75% stenosis, which is likely a result from the compensatory flow from the left ICA inclusion.  Her left ICA is occluded as noted previously on CT     Review of Systems  Neurological:  Positive for headaches.  All other systems reviewed and are negative.      Objective:   Physical Exam Vitals reviewed.  HENT:     Head: Normocephalic.  Neck:     Vascular: No carotid bruit.  Cardiovascular:     Rate and Rhythm: Normal rate.     Pulses: Normal pulses.  Pulmonary:     Effort: Pulmonary effort is normal.  Skin:    General: Skin is warm and dry.  Neurological:     Mental Status: She is alert and  oriented to person, place, and time.  Psychiatric:        Mood and Affect: Mood normal.        Behavior: Behavior normal.        Thought Content: Thought content normal.        Judgment: Judgment normal.     BP 121/78 (BP Location: Left Arm)   Pulse 93   Resp 18   Ht 5\' 7"  (1.702 m)   Wt 167 lb 3.2 oz (75.8 kg)   BMI 26.19 kg/m   Past Medical History:  Diagnosis Date   Cancer (HCC) 11/14/2007   Left breast wide excision, sentinel node biopsy. 1.4 cm, T1c, N0: ER 90%; PR 905; Her 2 neu not over expressed.    Diabetes mellitus without complication (HCC)    GERD (gastroesophageal reflux disease)    Hyperlipidemia    Hypertension    Vitamin D deficiency     Social History   Socioeconomic History   Marital status: Married    Spouse name: [name]   Number of children: 2   Years of education: Not on file   Highest education level: Not on file  Occupational History   Not on file  Tobacco Use   Smoking status: Every Day    Current packs/day: 1.00    Types: Cigarettes   Smokeless tobacco: Never  Vaping Use   Vaping status:  Never Used  Substance and Sexual Activity   Alcohol use: No   Drug use: No   Sexual activity: Not on file  Other Topics Concern   Not on file  Social History Narrative   Lives at home with spouse: 2 children (adults) live locally    Social Determinants of Health   Financial Resource Strain: Not on file  Food Insecurity: Not on file  Transportation Needs: Not on file  Physical Activity: Not on file  Stress: Not on file  Social Connections: Not on file  Intimate Partner Violence: Not on file    Past Surgical History:  Procedure Laterality Date   ABDOMINAL HYSTERECTOMY  2004   total, due to fibriods. Also had ovaries removed after 1 year of breast cancer.    BREAST LUMPECTOMY Left 2010   CAROTID PTA/STENT INTERVENTION Right 08/04/2023   Procedure: CAROTID PTA/STENT INTERVENTION;  Surgeon: Annice Needy, MD;  Location: [facility] INVASIVE CV LAB;   Service: Cardiovascular;  Laterality: Right;   COLONOSCOPY     25 yrs ago   COLONOSCOPY  04/16/2010   Dr Bluford Kaufmann   Kidney malfunction  Left    Nonfunctional : twisted during 2nd hysterectomy    Family History  Problem Relation Age of Onset   Stroke Mother    Diabetes Mother    Heart disease Father    Heart disease Sister     No Known Allergies     Latest Ref Rng & Units 08/05/2023    3:48 AM 07/03/2023    9:55 AM 12/24/2022    1:44 PM  CBC  WBC 4.0 - 10.5 K/uL 3.4  4.9  3.7   Hemoglobin 12.0 - 15.0 g/dL 41.3  24.4  01.0   Hematocrit 36.0 - 46.0 % 30.8  39.9  37.5   Platelets 150 - 400 K/uL 72  95  88       CMP     Component Value Date/Time   NA 138 08/05/2023 0348   NA 141 07/03/2023 0955   NA 136 02/17/2015 1423   K 4.0 08/05/2023 0348   K 4.1 02/17/2015 1423   CL 107 08/05/2023 0348   CL 102 02/17/2015 1423   CO2 22 08/05/2023 0348   CO2 28 02/17/2015 1423   GLUCOSE 207 (H) 08/05/2023 0348   GLUCOSE 139 (H) 02/17/2015 1423   BUN 19 08/05/2023 0348   BUN 25 (H) 07/03/2023 0955   BUN 12 02/17/2015 1423   CREATININE 1.12 (H) 08/05/2023 0348   CREATININE 0.88 02/17/2015 1423   CALCIUM 8.5 (L) 08/05/2023 0348   CALCIUM 9.3 02/17/2015 1423   PROT 6.9 07/03/2023 0955   PROT 7.4 02/17/2015 1423   ALBUMIN 4.6 07/03/2023 0955   ALBUMIN 4.2 02/17/2015 1423   AST 23 07/03/2023 0955   AST 29 02/17/2015 1423   ALT 24 07/03/2023 0955   ALT 30 02/17/2015 1423   ALKPHOS 62 07/03/2023 0955   ALKPHOS 81 02/17/2015 1423   BILITOT 0.4 07/03/2023 0955   BILITOT 0.4 02/17/2015 1423   EGFR 61 07/03/2023 0955   GFRNONAA 58 (L) 08/05/2023 0348   GFRNONAA >60 02/17/2015 1423     No results found.     Assessment & Plan:   1. Bilateral carotid artery stenosis Recommend:  The patient is s/p successful right carotid stent  Duplex ultrasound  shows patent right ICA stent with no internal stent narrowing, however compensatory velocities from her left ICA occlusion suggest a  50 to 74% stenosis  Continue  dual antiplatelet therapy as prescribed for a minimum of 6 months Continue management of CAD, HTN and Hyperlipidemia Healthy heart diet,  encouraged exercise at least 4 times per week  The patient's NIHSS score is as follows: 0 Mild: 1 - 5 Mild to Moderately Severe: 5 - 14 Severe: 15 - 24 Very Severe: >25  Follow up in 3 months with duplex ultrasound and physical exam based   2. Hypertension associated with diabetes (HCC) Continue antihypertensive medications as already ordered, these medications have been reviewed and there are no changes at this time.  3. Type 2 diabetes mellitus with hyperglycemia, without long-term current use of insulin (HCC) Continue hypoglycemic medications as already ordered, these medications have been reviewed and there are no changes at this time.  Hgb A1C to be monitored as already arranged by primary service   Current Outpatient Medications on File Prior to Visit  Medication Sig Dispense Refill   aspirin 81 MG chewable tablet Chew by mouth daily.     Blood Glucose Monitoring Suppl (ONE TOUCH ULTRA 2) w/Device KIT Test blood glucose once daily. 1 kit 0   Cholecalciferol (VITAMIN D3) 50 MCG (2000 UT) TABS Take by mouth.     clopidogrel (PLAVIX) 75 MG tablet Take 1 tablet (75 mg total) by mouth daily. 30 tablet 6   co-enzyme Q-10 30 MG capsule Take 1 capsule (30 mg total) by mouth 3 (three) times daily. 30 capsule 3   fenofibrate (TRICOR) 145 MG tablet Take 1 tablet (145 mg total) by mouth daily. 90 tablet 1   folic acid (FOLVITE) 1 MG tablet Take 1 mg by mouth daily.     glucose blood (ONETOUCH ULTRA) test strip Use as instructed 100 each 12   metFORMIN (GLUCOPHAGE-XR) 500 MG 24 hr tablet Take 1 tablet (500 mg total) by mouth daily with supper. 30 tablet 3   methotrexate (RHEUMATREX) 2.5 MG tablet Take 15 mg by mouth once a week.     omeprazole (PRILOSEC) 20 MG capsule TAKE 1 CAPSULE(20 MG) BY MOUTH DAILY 90 capsule 3    OneTouch Delica Lancets 33G MISC Use as directed 100 each 3   rosuvastatin (CRESTOR) 5 MG tablet Take 1 tablet (5 mg total) by mouth daily. 90 tablet 3   sertraline (ZOLOFT) 100 MG tablet Take 1 tablet (100 mg total) by mouth daily. TAKE 1 TABLET(100 MG) BY MOUTH DAILY 90 tablet 1   No current facility-administered medications on file prior to visit.    There are no Patient Instructions on file for this visit. No follow-ups on file.   Georgiana Spinner, NP

## 2023-12-02 ENCOUNTER — Other Ambulatory Visit (INDEPENDENT_AMBULATORY_CARE_PROVIDER_SITE_OTHER): Payer: Self-pay | Admitting: Nurse Practitioner

## 2023-12-02 DIAGNOSIS — E1165 Type 2 diabetes mellitus with hyperglycemia: Secondary | ICD-10-CM

## 2023-12-02 DIAGNOSIS — I6523 Occlusion and stenosis of bilateral carotid arteries: Secondary | ICD-10-CM

## 2023-12-09 ENCOUNTER — Other Ambulatory Visit (INDEPENDENT_AMBULATORY_CARE_PROVIDER_SITE_OTHER): Payer: Self-pay | Admitting: Nurse Practitioner

## 2023-12-09 ENCOUNTER — Ambulatory Visit (INDEPENDENT_AMBULATORY_CARE_PROVIDER_SITE_OTHER): Payer: PRIVATE HEALTH INSURANCE

## 2023-12-09 ENCOUNTER — Encounter (INDEPENDENT_AMBULATORY_CARE_PROVIDER_SITE_OTHER): Payer: Self-pay | Admitting: Nurse Practitioner

## 2023-12-09 ENCOUNTER — Ambulatory Visit (INDEPENDENT_AMBULATORY_CARE_PROVIDER_SITE_OTHER): Payer: PRIVATE HEALTH INSURANCE | Admitting: Nurse Practitioner

## 2023-12-09 VITALS — BP 162/88 | HR 91 | Resp 18 | Ht 66.0 in | Wt 169.0 lb

## 2023-12-09 DIAGNOSIS — I152 Hypertension secondary to endocrine disorders: Secondary | ICD-10-CM

## 2023-12-09 DIAGNOSIS — E1159 Type 2 diabetes mellitus with other circulatory complications: Secondary | ICD-10-CM | POA: Diagnosis not present

## 2023-12-09 DIAGNOSIS — I6523 Occlusion and stenosis of bilateral carotid arteries: Secondary | ICD-10-CM

## 2023-12-09 DIAGNOSIS — E1165 Type 2 diabetes mellitus with hyperglycemia: Secondary | ICD-10-CM

## 2023-12-09 DIAGNOSIS — M5416 Radiculopathy, lumbar region: Secondary | ICD-10-CM | POA: Diagnosis not present

## 2023-12-09 DIAGNOSIS — I739 Peripheral vascular disease, unspecified: Secondary | ICD-10-CM

## 2023-12-09 NOTE — Progress Notes (Signed)
Subjective:    Patient ID: Cassandra Butler, female    DOB: 1966/05/20, 58 y.o.   MRN: [id number] Chief Complaint  Patient presents with   Follow-up    3 month carotid + ABI    The patient is seen for follow up evaluation of carotid stenosis status post right carotid stent 08/04/2023.  There were no post operative problems or complications related to the surgery.  The patient denies neck or incisional pain.  She has had some headaches postprocedure and also notes that there is some popping in her ears.  The dizziness and syncopal episodes that she was having prior to stent placement have stopped.  The patient denies interval amaurosis fugax. There is no recent history of TIA symptoms or focal motor deficits. There is no prior documented CVA.  The patient denies headache.  The patient is taking enteric-coated aspirin 81 mg daily.  She notes that she has been having some worsening recent claudication.  No history of rest pain symptoms. No new ulcers or wounds of the lower extremities have occurred.  There is no history of DVT, PE or superficial thrombophlebitis. No recent episodes of angina or shortness of breath documented.   Today noninvasive studies show a widely patent right carotid stent however the velocities are suggestive of a 50 to 75% stenosis, which is likely a result from the compensatory flow from the left ICA inclusion.  Velocities have slightly increased from previous studies.  Her left ICA is occluded as noted previously on CT.  Currently she has an ABI 1.15 on the right and 1.12 on the left.  She has normal TBI's bilaterally with strong triphasic waveforms bilaterally as well.     Review of Systems  Neurological:  Positive for headaches.  All other systems reviewed and are negative.      Objective:   Physical Exam Vitals reviewed.  HENT:     Head: Normocephalic.  Neck:     Vascular: No carotid bruit.  Cardiovascular:     Rate and Rhythm: Normal rate.      Pulses: Normal pulses.  Pulmonary:     Effort: Pulmonary effort is normal.  Skin:    General: Skin is warm and dry.  Neurological:     Mental Status: She is alert and oriented to person, place, and time.  Psychiatric:        Mood and Affect: Mood normal.        Behavior: Behavior normal.        Thought Content: Thought content normal.        Judgment: Judgment normal.     BP (!) 162/88   Pulse 91   Resp 18   Ht 5\' 6"  (1.676 m)   Wt 169 lb (76.7 kg)   BMI 27.28 kg/m   Past Medical History:  Diagnosis Date   Cancer (HCC) 11/14/2007   Left breast wide excision, sentinel node biopsy. 1.4 cm, T1c, N0: ER 90%; PR 905; Her 2 neu not over expressed.    Diabetes mellitus without complication (HCC)    GERD (gastroesophageal reflux disease)    Hyperlipidemia    Hypertension    Vitamin D deficiency     Social History   Socioeconomic History   Marital status: Married    Spouse name: [name]   Number of children: 2   Years of education: Not on file   Highest education level: Not on file  Occupational History   Not on file  Tobacco Use  Smoking status: Every Day    Current packs/day: 1.00    Types: Cigarettes   Smokeless tobacco: Never  Vaping Use   Vaping status: Never Used  Substance and Sexual Activity   Alcohol use: No   Drug use: No   Sexual activity: Not on file  Other Topics Concern   Not on file  Social History Narrative   Lives at home with spouse: 2 children (adults) live locally    Social Drivers of Corporate investment banker Strain: Not on file  Food Insecurity: Not on file  Transportation Needs: Not on file  Physical Activity: Not on file  Stress: Not on file  Social Connections: Not on file  Intimate Partner Violence: Not on file    Past Surgical History:  Procedure Laterality Date   ABDOMINAL HYSTERECTOMY  2004   total, due to fibriods. Also had ovaries removed after 1 year of breast cancer.    BREAST LUMPECTOMY Left 2010   CAROTID PTA/STENT  INTERVENTION Right 08/04/2023   Procedure: CAROTID PTA/STENT INTERVENTION;  Surgeon: Annice Needy, MD;  Location: [facility];  Service: Cardiovascular;  Laterality: Right;   COLONOSCOPY     25 yrs ago   COLONOSCOPY  04/16/2010   Dr Bluford Kaufmann   Kidney malfunction  Left    Nonfunctional : twisted during 2nd hysterectomy    Family History  Problem Relation Age of Onset   Stroke Mother    Diabetes Mother    Heart disease Father    Heart disease Sister     No Known Allergies     Latest Ref Rng & Units 08/05/2023    3:48 AM 07/03/2023    9:55 AM 12/24/2022    1:44 PM  CBC  WBC 4.0 - 10.5 K/uL 3.4  4.9  3.7   Hemoglobin 12.0 - 15.0 g/dL 16.1  09.6  04.5   Hematocrit 36.0 - 46.0 % 30.8  39.9  37.5   Platelets 150 - 400 K/uL 72  95  88       CMP     Component Value Date/Time   NA 138 08/05/2023 0348   NA 141 07/03/2023 0955   NA 136 02/17/2015 1423   K 4.0 08/05/2023 0348   K 4.1 02/17/2015 1423   CL 107 08/05/2023 0348   CL 102 02/17/2015 1423   CO2 22 08/05/2023 0348   CO2 28 02/17/2015 1423   GLUCOSE 207 (H) 08/05/2023 0348   GLUCOSE 139 (H) 02/17/2015 1423   BUN 19 08/05/2023 0348   BUN 25 (H) 07/03/2023 0955   BUN 12 02/17/2015 1423   CREATININE 1.12 (H) 08/05/2023 0348   CREATININE 0.88 02/17/2015 1423   CALCIUM 8.5 (L) 08/05/2023 0348   CALCIUM 9.3 02/17/2015 1423   PROT 6.9 07/03/2023 0955   PROT 7.4 02/17/2015 1423   ALBUMIN 4.6 07/03/2023 0955   ALBUMIN 4.2 02/17/2015 1423   AST 23 07/03/2023 0955   AST 29 02/17/2015 1423   ALT 24 07/03/2023 0955   ALT 30 02/17/2015 1423   ALKPHOS 62 07/03/2023 0955   ALKPHOS 81 02/17/2015 1423   BILITOT 0.4 07/03/2023 0955   BILITOT 0.4 02/17/2015 1423   EGFR 61 07/03/2023 0955   GFRNONAA 58 (L) 08/05/2023 0348   GFRNONAA >60 02/17/2015 1423     No results found.     Assessment & Plan:   1. Bilateral carotid artery stenosis Today the patient has a patent right ICA however the velocities have slightly  increased from her recent stent placement.  I discussed with the patient that undergoing a CT scan typically gives [place] more's visualization due to the stent itself.  Instead what we will do is keep her on very close follow-up with ultrasound.  If there are some elevations at the next visit we may need to have the patient undergo angiogram with the possibility for treating as well.  2. Hypertension associated with diabetes (HCC) Continue antihypertensive medications as already ordered, these medications have been reviewed and there are no changes at this time.  3. Type 2 diabetes mellitus with hyperglycemia, without long-term current use of insulin (HCC) Continue hypoglycemic medications as already ordered, these medications have been reviewed and there are no changes at this time.  Hgb A1C to be monitored as already arranged by primary service   4. Lumbar radiculopathy Given the patient's description and type of claudication being more so consistent with weakness I think it would be prudent to evaluate her iliac vessels for possible inflow stenosis as a cause for the pain in her lower extremities.  However if the studies remain normal I suspect that her underlying pain may be related to her lumbar radiculopathy  Current Outpatient Medications on File Prior to Visit  Medication Sig Dispense Refill   aspirin 81 MG chewable tablet Chew by mouth daily.     Cholecalciferol (VITAMIN D3) 50 MCG (2000 UT) TABS Take by mouth.     clopidogrel (PLAVIX) 75 MG tablet Take 1 tablet (75 mg total) by mouth daily. 30 tablet 6   co-enzyme Q-10 30 MG capsule Take 1 capsule (30 mg total) by mouth 3 (three) times daily. 30 capsule 3   fenofibrate (TRICOR) 145 MG tablet Take 1 tablet (145 mg total) by mouth daily. 90 tablet 1   folic acid (FOLVITE) 1 MG tablet Take 1 mg by mouth daily.     glucose blood (ONETOUCH ULTRA) test strip Use as instructed 100 each 12   metFORMIN (GLUCOPHAGE-XR) 500 MG 24 hr tablet Take 1  tablet (500 mg total) by mouth daily with supper. 30 tablet 3   methotrexate (RHEUMATREX) 2.5 MG tablet Take 15 mg by mouth once a week.     omeprazole (PRILOSEC) 20 MG capsule TAKE 1 CAPSULE(20 MG) BY MOUTH DAILY 90 capsule 3   rosuvastatin (CRESTOR) 5 MG tablet Take 1 tablet (5 mg total) by mouth daily. 90 tablet 3   sertraline (ZOLOFT) 100 MG tablet Take 1 tablet (100 mg total) by mouth daily. TAKE 1 TABLET(100 MG) BY MOUTH DAILY 90 tablet 1   Blood Glucose Monitoring Suppl (ONE TOUCH ULTRA 2) w/Device KIT Test blood glucose once daily. (Patient not taking: Reported on 12/09/2023) 1 kit 0   OneTouch Delica Lancets 33G MISC Use as directed 100 each 3   No current facility-administered medications on file prior to visit.    There are no Patient Instructions on file for this visit. No follow-ups on file.   Georgiana Spinner, NP

## 2023-12-10 ENCOUNTER — Encounter (INDEPENDENT_AMBULATORY_CARE_PROVIDER_SITE_OTHER): Payer: Self-pay | Admitting: Nurse Practitioner

## 2023-12-10 ENCOUNTER — Ambulatory Visit (INDEPENDENT_AMBULATORY_CARE_PROVIDER_SITE_OTHER): Payer: PRIVATE HEALTH INSURANCE

## 2023-12-10 ENCOUNTER — Telehealth: Payer: Self-pay | Admitting: Family Medicine

## 2023-12-10 ENCOUNTER — Ambulatory Visit (INDEPENDENT_AMBULATORY_CARE_PROVIDER_SITE_OTHER): Payer: PRIVATE HEALTH INSURANCE | Admitting: Nurse Practitioner

## 2023-12-10 VITALS — BP 146/79 | HR 97 | Resp 18 | Ht 66.0 in | Wt 168.8 lb

## 2023-12-10 DIAGNOSIS — M5416 Radiculopathy, lumbar region: Secondary | ICD-10-CM

## 2023-12-10 DIAGNOSIS — E1165 Type 2 diabetes mellitus with hyperglycemia: Secondary | ICD-10-CM

## 2023-12-10 DIAGNOSIS — I6521 Occlusion and stenosis of right carotid artery: Secondary | ICD-10-CM

## 2023-12-10 DIAGNOSIS — I739 Peripheral vascular disease, unspecified: Secondary | ICD-10-CM

## 2023-12-10 LAB — VAS US ABI WITH/WO TBI
Left ABI: 1.12
Right ABI: 1.15

## 2023-12-10 NOTE — Progress Notes (Signed)
Subjective:    Patient ID: Cassandra Butler, female    DOB: 06-04-1966, 58 y.o.   MRN: [id number] Chief Complaint  Patient presents with   Follow-up    Encounter form : Patient convenience  Aorta Illiac    HPI  Review of Systems     Objective:   Physical Exam  BP (!) 146/79   Pulse 97   Resp 18   Ht 5\' 6"  (1.676 m)   Wt 168 lb 12.8 oz (76.6 kg)   BMI 27.25 kg/m   Past Medical History:  Diagnosis Date   Cancer (HCC) 11/14/2007   Left breast wide excision, sentinel node biopsy. 1.4 cm, T1c, N0: ER 90%; PR 905; Her 2 neu not over expressed.    Diabetes mellitus without complication (HCC)    GERD (gastroesophageal reflux disease)    Hyperlipidemia    Hypertension    Vitamin D deficiency     Social History   Socioeconomic History   Marital status: Married    Spouse name: [name]   Number of children: 2   Years of education: Not on file   Highest education level: Not on file  Occupational History   Not on file  Tobacco Use   Smoking status: Every Day    Current packs/day: 1.00    Types: Cigarettes   Smokeless tobacco: Never  Vaping Use   Vaping status: Never Used  Substance and Sexual Activity   Alcohol use: No   Drug use: No   Sexual activity: Not on file  Other Topics Concern   Not on file  Social History Narrative   Lives at home with spouse: 2 children (adults) live locally    Social Drivers of Corporate investment banker Strain: Not on file  Food Insecurity: Not on file  Transportation Needs: Not on file  Physical Activity: Not on file  Stress: Not on file  Social Connections: Not on file  Intimate Partner Violence: Not on file    Past Surgical History:  Procedure Laterality Date   ABDOMINAL HYSTERECTOMY  2004   total, due to fibriods. Also had ovaries removed after 1 year of breast cancer.    BREAST LUMPECTOMY Left 2010   CAROTID PTA/STENT INTERVENTION Right 08/04/2023   Procedure: CAROTID PTA/STENT INTERVENTION;  Surgeon: Annice Needy,  MD;  Location: [facility];  Service: Cardiovascular;  Laterality: Right;   COLONOSCOPY     25 yrs ago   COLONOSCOPY  04/16/2010   Dr Bluford Kaufmann   Kidney malfunction  Left    Nonfunctional : twisted during 2nd hysterectomy    Family History  Problem Relation Age of Onset   Stroke Mother    Diabetes Mother    Heart disease Father    Heart disease Sister     No Known Allergies     Latest Ref Rng & Units 08/05/2023    3:48 AM 07/03/2023    9:55 AM 12/24/2022    1:44 PM  CBC  WBC 4.0 - 10.5 K/uL 3.4  4.9  3.7   Hemoglobin 12.0 - 15.0 g/dL 40.9  81.1  91.4   Hematocrit 36.0 - 46.0 % 30.8  39.9  37.5   Platelets 150 - 400 K/uL 72  95  88       CMP     Component Value Date/Time   NA 138 08/05/2023 0348   NA 141 07/03/2023 0955   NA 136 02/17/2015 1423   K 4.0 08/05/2023 0348  K 4.1 02/17/2015 1423   CL 107 08/05/2023 0348   CL 102 02/17/2015 1423   CO2 22 08/05/2023 0348   CO2 28 02/17/2015 1423   GLUCOSE 207 (H) 08/05/2023 0348   GLUCOSE 139 (H) 02/17/2015 1423   BUN 19 08/05/2023 0348   BUN 25 (H) 07/03/2023 0955   BUN 12 02/17/2015 1423   CREATININE 1.12 (H) 08/05/2023 0348   CREATININE 0.88 02/17/2015 1423   CALCIUM 8.5 (L) 08/05/2023 0348   CALCIUM 9.3 02/17/2015 1423   PROT 6.9 07/03/2023 0955   PROT 7.4 02/17/2015 1423   ALBUMIN 4.6 07/03/2023 0955   ALBUMIN 4.2 02/17/2015 1423   AST 23 07/03/2023 0955   AST 29 02/17/2015 1423   ALT 24 07/03/2023 0955   ALT 30 02/17/2015 1423   ALKPHOS 62 07/03/2023 0955   ALKPHOS 81 02/17/2015 1423   BILITOT 0.4 07/03/2023 0955   BILITOT 0.4 02/17/2015 1423   EGFR 61 07/03/2023 0955   GFRNONAA 58 (L) 08/05/2023 0348   GFRNONAA >60 02/17/2015 1423     VAS [place] ABI WITH/WO TBI Result Date: 12/10/2023  LOWER EXTREMITY DOPPLER STUDY Patient Name:  Cassandra Butler  Date of Exam:   12/09/2023 Medical Rec #: [id number]         Accession #:    [id number] Date of Birth: 12-26-1965        Patient Gender: F Patient Age:   73  years Exam Location:   [facility] Procedure:      VAS [place] ABI WITH/WO TBI Referring Phys: [name] --------------------------------------------------------------------------------  Indications: Claudication.  Comparison Study: 03/21/2022 Performing Technologist: Debbe Bales RVS  Examination Guidelines: A complete evaluation includes at minimum, Doppler waveform signals and systolic blood pressure reading at the level of bilateral brachial, anterior tibial, and posterior tibial arteries, when vessel segments are accessible. Bilateral testing is considered an integral part of a complete examination. Photoelectric Plethysmograph (PPG) waveforms and toe systolic pressure readings are included as required and additional duplex testing as needed. Limited examinations for reoccurring indications may be performed as noted.  ABI Findings: +---------+------------------+-----+---------+--------+ Right    Rt Pressure (mmHg)IndexWaveform Comment  +---------+------------------+-----+---------+--------+ Brachial 156                                      +---------+------------------+-----+---------+--------+ ATA      179               1.15 triphasic         +---------+------------------+-----+---------+--------+ PTA      169               1.08 triphasic         +---------+------------------+-----+---------+--------+ Great Toe153               0.98 Normal            +---------+------------------+-----+---------+--------+ +---------+------------------+-----+---------+-------+ Left     Lt Pressure (mmHg)IndexWaveform Comment +---------+------------------+-----+---------+-------+ Brachial 154                                     +---------+------------------+-----+---------+-------+ ATA      173               1.11 triphasic        +---------+------------------+-----+---------+-------+ PTA      175  1.12 triphasic         +---------+------------------+-----+---------+-------+ Great Toe151               0.97 Normal           +---------+------------------+-----+---------+-------+ +-------+-----------+-----------+------------+------------+ ABI/TBIToday's ABIToday's TBIPrevious ABIPrevious TBI +-------+-----------+-----------+------------+------------+ Right  1.15       .98        1.22        .97          +-------+-----------+-----------+------------+------------+ Left   1.12       .97        1.16        1.03         +-------+-----------+-----------+------------+------------+ Right ABIs and TBIs appear essentially unchanged compared to prior study on 03/21/2022. Left ABIs and TBIs appear essentially unchanged compared to prior study on 03/21/2022.  Summary: Right: Resting right ankle-brachial index is within normal range. The right toe-brachial index is normal. Left: Resting left ankle-brachial index is within normal range. The left toe-brachial index is normal. *See table(s) above for measurements and observations.  Electronically signed by [name] MD on 12/10/2023 at 7:26:25 AM.    Final        Assessment & Plan:     1. Claudication (HCC) (Primary) Today noninvasive studies do indicate that there is some stenosis near the origin of the bilateral common iliac arteries.  Given this it is certainly possible that her pain is related to claudication from an arterial means.  However given her description of symptoms it is entirely possible that this is neurogenic in nature given her history of lumbar radiculopathy.  I had a long discussion with the patient regarding next steps including an angiogram in order to evaluate the vessels as well as to possibly treat.  We discussed the risk and benefits of this.  However we also discussed having her lower back evaluated as this will likely be more noninvasive.  Following this discussion the patient chooses to have an evaluation of her lower back.  We will refer the  patient to neurosurgery and have her follow-up in 3 months and discuss possible next steps if necessary pending her workup  2. Type 2 diabetes mellitus with hyperglycemia, without long-term current use of insulin (HCC) Continue hypoglycemic medications as already ordered, these medications have been reviewed and there are no changes at this time.  Hgb A1C to be monitored as already arranged by primary service  3. Carotid stenosis, right The patient recently had a carotid duplex.  We are closely following this and she will return in 3 months.  We will follow-up the status of her legs at that time  4. Lumbar radiculopathy Following the discussion with the patient, as noted above we will refer the patient to neurosurgery for evaluation of her lower back as a possible source of her ongoing walking issues.  Current Outpatient Medications on File Prior to Visit  Medication Sig Dispense Refill   aspirin 81 MG chewable tablet Chew by mouth daily.     Cholecalciferol (VITAMIN D3) 50 MCG (2000 UT) TABS Take by mouth.     clopidogrel (PLAVIX) 75 MG tablet Take 1 tablet (75 mg total) by mouth daily. 30 tablet 6   co-enzyme Q-10 30 MG capsule Take 1 capsule (30 mg total) by mouth 3 (three) times daily. 30 capsule 3   fenofibrate (TRICOR) 145 MG tablet Take 1 tablet (145 mg total) by mouth daily. 90 tablet 1   folic acid (FOLVITE) 1 MG tablet  Take 1 mg by mouth daily.     glucose blood (ONETOUCH ULTRA) test strip Use as instructed 100 each 12   metFORMIN (GLUCOPHAGE-XR) 500 MG 24 hr tablet Take 1 tablet (500 mg total) by mouth daily with supper. 30 tablet 3   methotrexate (RHEUMATREX) 2.5 MG tablet Take 15 mg by mouth once a week.     omeprazole (PRILOSEC) 20 MG capsule TAKE 1 CAPSULE(20 MG) BY MOUTH DAILY 90 capsule 3   OneTouch Delica Lancets 33G MISC Use as directed 100 each 3   rosuvastatin (CRESTOR) 5 MG tablet Take 1 tablet (5 mg total) by mouth daily. 90 tablet 3   sertraline (ZOLOFT) 100 MG  tablet Take 1 tablet (100 mg total) by mouth daily. TAKE 1 TABLET(100 MG) BY MOUTH DAILY 90 tablet 1   Blood Glucose Monitoring Suppl (ONE TOUCH ULTRA 2) w/Device KIT Test blood glucose once daily. (Patient not taking: Reported on 12/10/2023) 1 kit 0   No current facility-administered medications on file prior to visit.    There are no Patient Instructions on file for this visit. No follow-ups on file.   Georgiana Spinner, NP

## 2023-12-10 NOTE — Telephone Encounter (Signed)
[facility] faxed refill request for the following medications:  metFORMIN (GLUCOPHAGE-XR) 500 MG 24 hr tablet   Please advise.

## 2023-12-12 MED ORDER — METFORMIN HCL ER 500 MG PO TB24: 500.0000 mg | ORAL_TABLET | Freq: Every day | ORAL | 0 refills | Status: DC

## 2024-01-02 ENCOUNTER — Ambulatory Visit: Payer: Self-pay | Admitting: Family Medicine

## 2024-01-09 ENCOUNTER — Other Ambulatory Visit: Payer: Self-pay | Admitting: Family Medicine

## 2024-01-09 ENCOUNTER — Other Ambulatory Visit: Payer: Self-pay

## 2024-01-09 ENCOUNTER — Telehealth: Payer: Self-pay | Admitting: Family Medicine

## 2024-01-09 DIAGNOSIS — F411 Generalized anxiety disorder: Secondary | ICD-10-CM

## 2024-01-09 DIAGNOSIS — E1165 Type 2 diabetes mellitus with hyperglycemia: Secondary | ICD-10-CM

## 2024-01-09 MED ORDER — FENOFIBRATE 145 MG PO TABS: 145.0000 mg | ORAL_TABLET | Freq: Every day | ORAL | 1 refills | Status: DC

## 2024-01-09 NOTE — Telephone Encounter (Signed)
 Requested medication (s) are due for refill today: yes   Requested medication (s) are on the active medication list: yes   Last refill:  zoloft- 07/21/23 #90 1 refills, metformin- 12/12/23 #30 0 refills  Future visit scheduled: yes in 1 week   Notes to clinic:  do you want to give another courtesy refills?     Requested Prescriptions  Pending Prescriptions Disp Refills   sertraline (ZOLOFT) 100 MG tablet [Pharmacy Med Name: SERTRALINE 100MG  TABLETS] 90 tablet 1    Sig: TAKE 1 TABLET(100 MG) BY MOUTH DAILY     Psychiatry:  Antidepressants - SSRI - sertraline Failed - 01/09/2024  4:26 PM      Failed - Valid encounter within last 6 months    Recent Outpatient Visits           6 months ago Encounter for annual physical exam   [place] [facility] [place], Marzella Schlein, MD   8 months ago Essential (primary) hypertension   [place] [facility] [place]Rob[place], [place], MD   9 months ago Type 2 diabetes mellitus with hyperglycemia, without long-term current use of insulin (HCC)   [place] [facility] [place], Marzella Schlein, MD   1 year ago Type 2 diabetes mellitus with hyperglycemia, without long-term current use of insulin [facility])   [place] [facility] [place], Marzella Schlein, MD   1 year ago Polyarthralgia   [place] [facility] [place], Marzella Schlein, MD       Future Appointments             In 1 week Bacigal[place], Marzella Schlein, MD [facility], [facility]            Passed - AST in normal range and within 360 days    AST  Date Value Ref Range Status  07/03/2023 23 0 - 40 IU/L Final   SGOT(AST)  Date Value Ref Range Status  02/17/2015 29 U/L Final    Comment:    15-41 NOTE: New Reference Range  01/17/15          Passed - ALT in normal range and within 360 days    ALT  Date Value Ref Range Status  07/03/2023 24 0 - 32 IU/L Final   SGPT (ALT)  Date Value Ref Range  Status  02/17/2015 30 U/L Final    Comment:    14-54 NOTE: New Reference Range  01/17/15          Passed - Completed PHQ-2 or PHQ-9 in the last 360 days       metFORMIN (GLUCOPHAGE-XR) 500 MG 24 hr tablet [Pharmacy Med Name: METFORMIN ER 500MG  24HR TABS] 30 tablet 0    Sig: TAKE 1 TABLET(500 MG) BY MOUTH DAILY WITH SUPPER     Endocrinology:  Diabetes - Biguanides Failed - 01/09/2024  4:26 PM      Failed - Cr in normal range and within 360 days    Creatinine  Date Value Ref Range Status  02/17/2015 0.88 mg/dL Final    Comment:    6.29-5.28 NOTE: New Reference Range  01/17/15    Creatinine, Ser  Date Value Ref Range Status  08/05/2023 1.12 (H) 0.44 - 1.00 mg/dL Final         Failed - HBA1C is between 0 and 7.9 and within 180 days    Hemoglobin A1C  Date Value Ref Range Status  07/03/2023 6.3 (A) 4.0 - 5.6 % Final  03/24/2017 9.0  Final   Hgb A1c MFr Bld  Date Value Ref Range Status  12/24/2022 8.6 (H) 4.8 - 5.6 % Final    Comment:             Prediabetes: 5.7 - 6.4          Diabetes: >6.4          Glycemic control for adults with diabetes: <7.0          Failed - eGFR in normal range and within 360 days    EGFR (African American)  Date Value Ref Range Status  02/17/2015 >60  Final   GFR calc Af Amer  Date Value Ref Range Status  10/12/2019 >60 >60 mL/min Final   EGFR (Non-African Amer.)  Date Value Ref Range Status  02/17/2015 >60  Final    Comment:    eGFR values <19mL/min/1.73 m2 may be an indication of chronic kidney disease (CKD). Calculated eGFR is useful in patients with stable renal function. The eGFR calculation will not be reliable in acutely ill patients when serum creatinine is changing rapidly. It is not useful in patients on dialysis. The eGFR calculation may not be applicable to patients at the low and high extremes of body sizes, pregnant women, and vegetarians.    GFR, Estimated  Date Value Ref Range Status  08/05/2023 58 (L) >60  mL/min Final    Comment:    (NOTE) Calculated using the CKD-EPI Creatinine Equation (2021)    eGFR  Date Value Ref Range Status  07/03/2023 61 >59 mL/min/1.73 Final         Failed - B12 Level in normal range and within 720 days    No results found for: "VITAMINB12"       Failed - Valid encounter within last 6 months    Recent Outpatient Visits           6 months ago Encounter for annual physical exam   [place] [facility] [place], Marzella Schlein, MD   8 months ago Essential (primary) hypertension   [place] [facility] [place]Rob[place], [place], MD   9 months ago Type 2 diabetes mellitus with hyperglycemia, without long-term current use of insulin [facility])   [place] [facility] [place], Marzella Schlein, MD   1 year ago Type 2 diabetes mellitus with hyperglycemia, without long-term current use of insulin [facility])   [place] [facility] [place], Marzella Schlein, MD   1 year ago Polyarthralgia   [place] [facility] [place], Marzella Schlein, MD       Future Appointments             In 1 week Bacigalupo, Marzella Schlein, MD [facility], [facility]            Passed - CBC within normal limits and completed in the last 12 months    WBC  Date Value Ref Range Status  08/05/2023 3.4 (L) 4.0 - 10.5 K/uL Final   RBC  Date Value Ref Range Status  08/05/2023 3.45 (L) 3.87 - 5.11 MIL/uL Final   Hemoglobin  Date Value Ref Range Status  08/05/2023 10.5 (L) 12.0 - 15.0 g/dL Final  16/08/9603 54.0 11.1 - 15.9 g/dL Final   HCT  Date Value Ref Range Status  08/05/2023 30.8 (L) 36.0 - 46.0 % Final   Hematocrit  Date Value Ref Range Status  07/03/2023 39.9 34.0 - 46.6 % Final   MCHC  Date Value Ref Range Status  08/05/2023 34.1 30.0 - 36.0 g/dL Final   [facility]  Date Value Ref Range Status  08/05/2023 30.4 26.0 - 34.0 pg Final   MCV  Date Value Ref Range Status  08/05/2023 89.3 80.0 -  100.0 fL Final  07/03/2023 89 79 - 97 fL Final  02/17/2015 80 80 - 100 fL Final   No results found for: "PLTCOUNTKUC", "LABPLAT", "POCPLA" RDW  Date Value Ref Range Status  08/05/2023 14.2 11.5 - 15.5 % Final  07/03/2023 14.0 11.7 - 15.4 % Final  02/17/2015 15.6 (H) 11.5 - 14.5 % Final

## 2024-01-09 NOTE — Telephone Encounter (Signed)
 [facility] pharmacy faxed refill request for the following medications:   fenofibrate (TRICOR) 145 MG tablet    Please advise

## 2024-01-09 NOTE — Telephone Encounter (Signed)
 Rx filled

## 2024-01-16 ENCOUNTER — Encounter: Payer: Self-pay | Admitting: Family Medicine

## 2024-01-16 ENCOUNTER — Ambulatory Visit: Payer: PRIVATE HEALTH INSURANCE | Admitting: Family Medicine

## 2024-01-16 ENCOUNTER — Encounter (INDEPENDENT_AMBULATORY_CARE_PROVIDER_SITE_OTHER): Payer: Self-pay

## 2024-01-16 ENCOUNTER — Other Ambulatory Visit (INDEPENDENT_AMBULATORY_CARE_PROVIDER_SITE_OTHER): Payer: Self-pay | Admitting: Nurse Practitioner

## 2024-01-16 VITALS — BP 134/77 | HR 92 | Ht 66.0 in | Wt 167.0 lb

## 2024-01-16 DIAGNOSIS — E1169 Type 2 diabetes mellitus with other specified complication: Secondary | ICD-10-CM

## 2024-01-16 DIAGNOSIS — D696 Thrombocytopenia, unspecified: Secondary | ICD-10-CM | POA: Diagnosis not present

## 2024-01-16 DIAGNOSIS — N1831 Chronic kidney disease, stage 3a: Secondary | ICD-10-CM

## 2024-01-16 DIAGNOSIS — I739 Peripheral vascular disease, unspecified: Secondary | ICD-10-CM

## 2024-01-16 DIAGNOSIS — D61818 Other pancytopenia: Secondary | ICD-10-CM | POA: Insufficient documentation

## 2024-01-16 DIAGNOSIS — E1159 Type 2 diabetes mellitus with other circulatory complications: Secondary | ICD-10-CM | POA: Diagnosis not present

## 2024-01-16 DIAGNOSIS — G4733 Obstructive sleep apnea (adult) (pediatric): Secondary | ICD-10-CM

## 2024-01-16 DIAGNOSIS — E559 Vitamin D deficiency, unspecified: Secondary | ICD-10-CM

## 2024-01-16 DIAGNOSIS — I152 Hypertension secondary to endocrine disorders: Secondary | ICD-10-CM

## 2024-01-16 DIAGNOSIS — Z720 Tobacco use: Secondary | ICD-10-CM

## 2024-01-16 DIAGNOSIS — I6521 Occlusion and stenosis of right carotid artery: Secondary | ICD-10-CM

## 2024-01-16 DIAGNOSIS — D649 Anemia, unspecified: Secondary | ICD-10-CM

## 2024-01-16 DIAGNOSIS — F411 Generalized anxiety disorder: Secondary | ICD-10-CM

## 2024-01-16 DIAGNOSIS — E1165 Type 2 diabetes mellitus with hyperglycemia: Secondary | ICD-10-CM | POA: Diagnosis not present

## 2024-01-16 DIAGNOSIS — L0291 Cutaneous abscess, unspecified: Secondary | ICD-10-CM

## 2024-01-16 DIAGNOSIS — M5416 Radiculopathy, lumbar region: Secondary | ICD-10-CM

## 2024-01-16 LAB — POCT GLYCOSYLATED HEMOGLOBIN (HGB A1C): Hemoglobin A1C: 12.2 % — AB (ref 4.0–5.6)

## 2024-01-16 MED ORDER — METFORMIN HCL ER 500 MG PO TB24
500.0000 mg | ORAL_TABLET | Freq: Two times a day (BID) | ORAL | 1 refills | Status: DC
Start: 2024-01-16 — End: 2024-07-14

## 2024-01-16 MED ORDER — DEXCOM G7 SENSOR MISC: 11 refills | Status: DC

## 2024-01-16 NOTE — Telephone Encounter (Signed)
 Placed referral for neurosurgery in epic

## 2024-01-16 NOTE — Progress Notes (Signed)
 Established patient visit   Patient: Cassandra Butler   DOB: 1966-09-03   57 y.o. Female  MRN: [id number] Visit Date: 01/16/2024  Today's healthcare provider: Shirlee Latch, MD   Chief Complaint  Patient presents with   Medical Management of Chronic Issues    6 month follow-up   Diabetes   Hyperlipidemia   Subjective    HPI HPI     Medical Management of Chronic Issues    Additional comments: 6 month follow-up      Last edited by Acey Lav, CMA on 01/16/2024  9:04 AM.       Discussed the use of AI scribe software for clinical note transcription with the patient, who gave verbal consent to proceed.  History of Present Illness   A 58 year old female with a history of hypertension, type two diabetes, hyperlipidemia, anemia, stage three A chronic kidney disease, low vitamin D, and thrombocytopenia presents for a chronic follow-up. She reports recent dental procedures which caused some discomfort and bleeding. She admits to struggling with her diet and weight, and has noticed an increase in urination. She also reports a recent abscess which has been causing her discomfort. She admits to being a smoker and expresses difficulty in quitting. She also mentions that she has been off her metformin medication (only taking once daily not BID) and has been experiencing stress eating due to recent life events.          07/03/2023    8:44 AM 05/14/2023    2:25 PM 04/01/2023    8:15 AM 12/31/2022    8:14 AM 04/09/2022    3:59 PM  Depression screen PHQ 2/9  Decreased Interest 0 0 0 0 0  Down, Depressed, Hopeless 0 0 0 0 0  PHQ - 2 Score 0 0 0 0 0  Altered sleeping 0  0 1 0  Tired, decreased energy 0  0 1 0  Change in appetite 0  0 1 0  Feeling bad or failure about yourself  0  0 0 0  Trouble concentrating 0  0 0 0  Moving slowly or fidgety/restless 0  0 0 0  Suicidal thoughts 0  0 0 0  PHQ-9 Score 0  0 3 0  Difficult doing work/chores Not difficult at all  Not difficult  at all Not difficult at all Not difficult at all      07/03/2023    8:44 AM 12/31/2022    8:08 AM 04/23/2021    4:00 PM  GAD 7 : Generalized Anxiety Score  Nervous, Anxious, on Edge 0 0 0  Control/stop worrying 0 0 0  Worry too much - different things 0 0 0  Trouble relaxing 0 0 0  Restless 0 0 0  Easily annoyed or irritable 0 0 0  Afraid - awful might happen 0 0 0  Total GAD 7 Score 0 0 0  Anxiety Difficulty Not difficult at all Not difficult at all Not difficult at all       Medications: Outpatient Medications Prior to Visit  Medication Sig   aspirin 81 MG chewable tablet Chew by mouth daily.   Blood Glucose Monitoring Suppl (ONE TOUCH ULTRA 2) w/Device KIT Test blood glucose once daily. (Patient not taking: Reported on 12/10/2023)   Cholecalciferol (VITAMIN D3) 50 MCG (2000 UT) TABS Take by mouth.   clopidogrel (PLAVIX) 75 MG tablet Take 1 tablet (75 mg total) by mouth daily.   co-enzyme Q-10 30 MG capsule Take  1 capsule (30 mg total) by mouth 3 (three) times daily.   fenofibrate (TRICOR) 145 MG tablet Take 1 tablet (145 mg total) by mouth daily.   glucose blood (ONETOUCH ULTRA) test strip Use as instructed   methotrexate (RHEUMATREX) 2.5 MG tablet Take 15 mg by mouth once a week.   omeprazole (PRILOSEC) 20 MG capsule TAKE 1 CAPSULE(20 MG) BY MOUTH DAILY   OneTouch Delica Lancets 33G MISC Use as directed   rosuvastatin (CRESTOR) 5 MG tablet Take 1 tablet (5 mg total) by mouth daily.   sertraline (ZOLOFT) 100 MG tablet TAKE 1 TABLET(100 MG) BY MOUTH DAILY   [DISCONTINUED] metFORMIN (GLUCOPHAGE-XR) 500 MG 24 hr tablet TAKE 1 TABLET(500 MG) BY MOUTH DAILY WITH SUPPER   No facility-administered medications prior to visit.    Review of Systems     Objective    BP 134/77 (BP Location: Left Arm, Patient Position: Sitting, Cuff Size: Normal)   Pulse 92   Ht 5\' 6"  (1.676 m)   Wt 167 lb (75.8 kg)   SpO2 100%   BMI 26.95 kg/m    Physical Exam Vitals reviewed.   Constitutional:      General: She is not in acute distress.    Appearance: Normal appearance. She is well-developed. She is not diaphoretic.  HENT:     Head: Normocephalic and atraumatic.  Eyes:     General: No scleral icterus.    Conjunctiva/sclera: Conjunctivae normal.  Neck:     Thyroid: No thyromegaly.  Cardiovascular:     Rate and Rhythm: Normal rate and regular rhythm.     Heart sounds: Normal heart sounds.  Pulmonary:     Effort: Pulmonary effort is normal. No respiratory distress.     Breath sounds: Normal breath sounds. No wheezing, rhonchi or rales.  Musculoskeletal:     Cervical back: Neck supple.     Right lower leg: No edema.     Left lower leg: No edema.  Lymphadenopathy:     Cervical: No cervical adenopathy.  Skin:    General: Skin is warm and dry.     Findings: No rash.  Neurological:     Mental Status: She is alert and oriented to person, place, and time. Mental status is at baseline.  Psychiatric:        Mood and Affect: Mood normal.        Behavior: Behavior normal.      Results for orders placed or performed in visit on 01/16/24  POCT HgB A1C  Result Value Ref Range   Hemoglobin A1C 12.2 (A) 4.0 - 5.6 %   HbA1c POC (<> result, manual entry)     HbA1c, POC (prediabetic range)     HbA1c, POC (controlled diabetic range)      Assessment & Plan     Problem List Items Addressed This Visit       Cardiovascular and Mediastinum   Hypertension associated with diabetes (HCC)   Blood pressure is well-controlled as part of comprehensive cardiovascular risk management.      Relevant Medications   metFORMIN (GLUCOPHAGE-XR) 500 MG 24 hr tablet   Other Relevant Orders   Comprehensive metabolic panel   Carotid stenosis, right   F/b VVS Discussed RF management        Respiratory   Obstructive sleep apnea of adult   Not using CPAP        Endocrine   Hyperlipidemia associated with type 2 diabetes mellitus (HCC)   On Crestor (rosuvastatin) for  hyperlipidemia and cardiovascular event prevention. Due for a cholesterol check to assess regimen effectiveness. - Order lipid panel to assess cholesterol levels - Continue Crestor as prescribed      Relevant Medications   metFORMIN (GLUCOPHAGE-XR) 500 MG 24 hr tablet   Other Relevant Orders   Lipid panel   Comprehensive metabolic panel   Type 2 diabetes mellitus with hyperglycemia (HCC) - Primary   A1c has increased from 6.3% to 12.2%, indicating poor glycemic control due to stress eating and dietary indiscretions. Currently on metformin 500 mg once daily; glipizide was discontinued due to hypoglycemia. Interested in resuming continuous glucose monitoring (Dexcom). - Increase metformin XR to 500 mg twice daily - Order Dexcom G7 continuous glucose monitor, with prior authorization if needed - Encourage dietary modifications to improve glycemic control - Schedule follow-up in 3 months to reassess A1c and diabetes management      Relevant Medications   metFORMIN (GLUCOPHAGE-XR) 500 MG 24 hr tablet   Other Relevant Orders   POCT HgB A1C (Completed)     Genitourinary   Stage 3a chronic kidney disease (HCC)   Kidney function is declining with slightly elevated creatinine levels, likely related to diabetes and hypertension. She has only one kidney, necessitating careful monitoring. - Monitor kidney function with regular lab tests      Relevant Orders   Comprehensive metabolic panel     Hematopoietic and Hemostatic   Thrombocytopenia (HCC)   Anemia and thrombocytopenia may be exacerbated by aspirin and Plavix. Anemia onset was around her carotid artery procedure. - Monitor complete blood count to assess anemia and platelet levels      Relevant Orders   CBC w/Diff/Platelet     Other   GAD (generalized anxiety disorder)   Anxiety is well-controlled with Zoloft 100 mg daily. - Continue Zoloft 100 mg daily      Current tobacco use   Smoker advised on smoking's contribution to  arterial and cardiovascular issues. Acknowledges need to quit but is not ready. Exercise is limited due to claudication and arterial issues. - Encourage smoking cessation - Discuss the impact of smoking on cardiovascular and arterial health       Avitaminosis D   Continue supplement Recheck level       Relevant Orders   VITAMIN D 25 Hydroxy (Vit-D Deficiency, Fractures)   Claudication (HCC)   F/b VVS      Anemia   Anemia and thrombocytopenia may be exacerbated by aspirin and Plavix. Anemia onset was around her carotid artery procedure. - Monitor complete blood count to assess anemia and platelet levels      Relevant Orders   CBC w/Diff/Platelet   Other Visit Diagnoses       Abscess               Abscess (Boil) Abscess on her tail is draining. Hot salt water soaks have reduced pain and swelling. Currently on penicillin, aiding infection resolution. - Continue current course of penicillin - Advise sitz baths with warm water and salt to promote drainage and healing       Return in about 3 months (around 04/17/2024) for chronic disease f/u.       Total time spent on today's visit was greater than 40 minutes, including both face-to-face time and nonface-to-face time personally spent on review of chart (labs and imaging), discussing labs and goals, discussing further work-up, treatment options, answering patient's questions, and coordinating care.   Shirlee Latch, MD  [facility] [phone]  phone) [phone] (fax)  [facility]

## 2024-01-16 NOTE — Assessment & Plan Note (Signed)
F/b VVS 

## 2024-01-16 NOTE — Assessment & Plan Note (Signed)
 Smoker advised on smoking's contribution to arterial and cardiovascular issues. Acknowledges need to quit but is not ready. Exercise is limited due to claudication and arterial issues. - Encourage smoking cessation - Discuss the impact of smoking on cardiovascular and arterial health

## 2024-01-16 NOTE — Assessment & Plan Note (Signed)
 Anemia and thrombocytopenia may be exacerbated by aspirin and Plavix. Anemia onset was around her carotid artery procedure. - Monitor complete blood count to assess anemia and platelet levels

## 2024-01-16 NOTE — Assessment & Plan Note (Signed)
 Blood pressure is well-controlled as part of comprehensive cardiovascular risk management.

## 2024-01-16 NOTE — Assessment & Plan Note (Signed)
 Anxiety is well-controlled with Zoloft 100 mg daily. - Continue Zoloft 100 mg daily

## 2024-01-16 NOTE — Assessment & Plan Note (Signed)
 Continue supplement Recheck level

## 2024-01-16 NOTE — Assessment & Plan Note (Signed)
 A1c has increased from 6.3% to 12.2%, indicating poor glycemic control due to stress eating and dietary indiscretions. Currently on metformin 500 mg once daily; glipizide was discontinued due to hypoglycemia. Interested in resuming continuous glucose monitoring (Dexcom). - Increase metformin XR to 500 mg twice daily - Order Dexcom G7 continuous glucose monitor, with prior authorization if needed - Encourage dietary modifications to improve glycemic control - Schedule follow-up in 3 months to reassess A1c and diabetes management

## 2024-01-16 NOTE — Assessment & Plan Note (Signed)
 Not using CPAP.

## 2024-01-16 NOTE — Assessment & Plan Note (Signed)
 Kidney function is declining with slightly elevated creatinine levels, likely related to diabetes and hypertension. She has only one kidney, necessitating careful monitoring. - Monitor kidney function with regular lab tests

## 2024-01-16 NOTE — Assessment & Plan Note (Signed)
 On Crestor (rosuvastatin) for hyperlipidemia and cardiovascular event prevention. Due for a cholesterol check to assess regimen effectiveness. - Order lipid panel to assess cholesterol levels - Continue Crestor as prescribed

## 2024-01-16 NOTE — Assessment & Plan Note (Signed)
 F/b VVS Discussed RF management

## 2024-01-17 LAB — CBC WITH DIFFERENTIAL/PLATELET
Basophils Absolute: 0 10*3/uL (ref 0.0–0.2)
Basos: 1 %
EOS (ABSOLUTE): 0 10*3/uL (ref 0.0–0.4)
Eos: 0 %
Hematocrit: 39.7 % (ref 34.0–46.6)
Hemoglobin: 12.8 g/dL (ref 11.1–15.9)
Immature Grans (Abs): 0 10*3/uL (ref 0.0–0.1)
Immature Granulocytes: 1 %
Lymphocytes Absolute: 0.7 10*3/uL (ref 0.7–3.1)
Lymphs: 20 %
MCH: 27.7 pg (ref 26.6–33.0)
MCHC: 32.2 g/dL (ref 31.5–35.7)
MCV: 86 fL (ref 79–97)
Monocytes Absolute: 0.2 10*3/uL (ref 0.1–0.9)
Monocytes: 4 %
Neutrophils Absolute: 2.7 10*3/uL (ref 1.4–7.0)
Neutrophils: 74 %
Platelets: 88 10*3/uL — CL (ref 150–450)
RBC: 4.62 x10E6/uL (ref 3.77–5.28)
RDW: 14.3 % (ref 11.7–15.4)
WBC: 3.6 10*3/uL (ref 3.4–10.8)

## 2024-01-17 LAB — COMPREHENSIVE METABOLIC PANEL
ALT: 23 IU/L (ref 0–32)
AST: 23 IU/L (ref 0–40)
Albumin: 4.5 g/dL (ref 3.8–4.9)
Alkaline Phosphatase: 102 IU/L (ref 44–121)
BUN/Creatinine Ratio: 18 (ref 9–23)
BUN: 17 mg/dL (ref 6–24)
Bilirubin Total: 0.7 mg/dL (ref 0.0–1.2)
CO2: 22 mmol/L (ref 20–29)
Calcium: 9.8 mg/dL (ref 8.7–10.2)
Chloride: 93 mmol/L — ABNORMAL LOW (ref 96–106)
Creatinine, Ser: 0.97 mg/dL (ref 0.57–1.00)
Globulin, Total: 2.4 g/dL (ref 1.5–4.5)
Glucose: 393 mg/dL — ABNORMAL HIGH (ref 70–99)
Potassium: 4.7 mmol/L (ref 3.5–5.2)
Sodium: 132 mmol/L — ABNORMAL LOW (ref 134–144)
Total Protein: 6.9 g/dL (ref 6.0–8.5)
eGFR: 69 mL/min/{1.73_m2} (ref >59)

## 2024-01-17 LAB — LIPID PANEL
Chol/HDL Ratio: 7.3 ratio — ABNORMAL HIGH (ref 0.0–4.4)
Cholesterol, Total: 220 mg/dL — ABNORMAL HIGH (ref 100–199)
HDL: 30 mg/dL — ABNORMAL LOW (ref >39)
Triglycerides: 879 mg/dL (ref 0–149)

## 2024-01-17 LAB — VITAMIN D 25 HYDROXY (VIT D DEFICIENCY, FRACTURES): Vit D, 25-Hydroxy: 18.7 ng/mL — ABNORMAL LOW (ref 30.0–100.0)

## 2024-01-19 ENCOUNTER — Encounter: Payer: Self-pay | Admitting: Family Medicine

## 2024-01-19 ENCOUNTER — Other Ambulatory Visit: Payer: Self-pay

## 2024-01-19 DIAGNOSIS — E559 Vitamin D deficiency, unspecified: Secondary | ICD-10-CM

## 2024-01-19 MED ORDER — VITAMIN D (ERGOCALCIFEROL) 1.25 MG (50000 UNIT) PO CAPS
50000.0000 [IU] | ORAL_CAPSULE | ORAL | 0 refills | Status: DC
Start: 2024-01-19 — End: 2024-04-19

## 2024-01-20 ENCOUNTER — Encounter (INDEPENDENT_AMBULATORY_CARE_PROVIDER_SITE_OTHER): Payer: Self-pay

## 2024-01-21 NOTE — Progress Notes (Unsigned)
 Referring Physician:  Georgiana Spinner, NP [address] [place],  [place] [address]  Primary Physician:  Erasmo Downer, MD  History of Present Illness: 01/22/2024 [name] has a history of DM, hyperlipidemia, anxiety, RA, OSA, and breast CA.   Has seen rheumatology for RA.   Last did phone visit with me on 04/03/23. She has known disc bulge at L4-L5 that may be contacting L5 nerve. Disc bulge L5-S1 as well as facet hypertrophy L4-S1. No spinal stenosis noted.   She was doing better after she switched jobs. She was lost to follow up.   She has intermittent LBP with intermittent bilateral leg pain- legs feel heavy and like they weigh 500 lbs. Legs feel like they will give way with walking and feel shaky. She only has back and leg pain with walking or standing. Pain improves once she stops to sit.   She has a carotid stent in her neck in October.   Bowel/Bladder Dysfunction: none  Conservative measures:  Physical therapy: has not participated  Multimodal medical therapy including regular antiinflammatories: prednisone, ibuprofen Injections: has not received epidural steroid injections  Past Surgery: no prior spine surgery  NEIMA LACROSS has no symptoms of cervical myelopathy.  The symptoms are causing a significant impact on the patient's life.   Review of Systems:  A 10 point review of systems is negative, except for the pertinent positives and negatives detailed in the HPI.  Past Medical History: Past Medical History:  Diagnosis Date   Cancer (HCC) 11/14/2007   Left breast wide excision, sentinel node biopsy. 1.4 cm, T1c, N0: ER 90%; PR 905; Her 2 neu not over expressed.    Diabetes mellitus without complication (HCC)    GERD (gastroesophageal reflux disease)    Hyperlipidemia    Hypertension    Vitamin D deficiency     Past Surgical History: Past Surgical History:  Procedure Laterality Date   ABDOMINAL HYSTERECTOMY  2004   total,  due to fibriods. Also had ovaries removed after 1 year of breast cancer.    BREAST LUMPECTOMY Left 2010   CAROTID PTA/STENT INTERVENTION Right 08/04/2023   Procedure: CAROTID PTA/STENT INTERVENTION;  Surgeon: Annice Needy, MD;  Location: [facility];  Service: Cardiovascular;  Laterality: Right;   COLONOSCOPY     25 yrs ago   COLONOSCOPY  04/16/2010   Dr Bluford Kaufmann   Kidney malfunction  Left    Nonfunctional : twisted during 2nd hysterectomy    Allergies: Allergies as of 01/22/2024   (No Known Allergies)    Medications: Outpatient Encounter Medications as of 01/22/2024  Medication Sig   aspirin 81 MG chewable tablet Chew by mouth daily.   Blood Glucose Monitoring Suppl (ONE TOUCH ULTRA 2) w/Device KIT Test blood glucose once daily.   Cholecalciferol (VITAMIN D3) 50 MCG (2000 UT) TABS Take by mouth.   clopidogrel (PLAVIX) 75 MG tablet Take 1 tablet (75 mg total) by mouth daily.   co-enzyme Q-10 30 MG capsule Take 1 capsule (30 mg total) by mouth 3 (three) times daily.   fenofibrate (TRICOR) 145 MG tablet Take 1 tablet (145 mg total) by mouth daily.   glucose blood (ONETOUCH ULTRA) test strip Use as instructed   metFORMIN (GLUCOPHAGE-XR) 500 MG 24 hr tablet Take 1 tablet (500 mg total) by mouth 2 (two) times daily with a meal.   methotrexate (RHEUMATREX) 2.5 MG tablet Take 15 mg by mouth once a week.   omeprazole (PRILOSEC)  20 MG capsule TAKE 1 CAPSULE(20 MG) BY MOUTH DAILY   OneTouch Delica Lancets 33G MISC Use as directed   rosuvastatin (CRESTOR) 5 MG tablet Take 1 tablet (5 mg total) by mouth daily.   sertraline (ZOLOFT) 100 MG tablet TAKE 1 TABLET(100 MG) BY MOUTH DAILY   Vitamin D, Ergocalciferol, (DRISDOL) 1.25 MG (50000 UNIT) CAPS capsule Take 1 capsule (50,000 Units total) by mouth every 7 (seven) days.   [DISCONTINUED] Continuous Glucose Sensor (DEXCOM G7 SENSOR) MISC Apply to skin to monitor glucose continuously. Change q10 days. (Patient not taking: Reported on 01/22/2024)    No facility-administered encounter medications on file as of 01/22/2024.    Social History: Social History   Tobacco Use   Smoking status: Every Day    Current packs/day: 1.00    Types: Cigarettes   Smokeless tobacco: Never  Vaping Use   Vaping status: Never Used  Substance Use Topics   Alcohol use: No   Drug use: No    Family Medical History: Family History  Problem Relation Age of Onset   Stroke Mother    Diabetes Mother    Heart disease Father    Heart disease Sister     Physical Examination: Vitals:   01/22/24 1016  BP: 128/78      Awake, alert, oriented to person, place, and time.  Speech is clear and fluent. Fund of knowledge is appropriate.   Cranial Nerves: Pupils equal round and reactive to light.  Facial tone is symmetric.    No abnormal lesions on exposed skin.   Strength:  Side Iliopsoas Quads Hamstring PF DF EHL  R 5 5 5 5 5 5   L 5 5 5 5 5 5    Reflexes are 2+ and symmetric at the patella and achilles.    Clonus is not present.   Bilateral lower extremity sensation is intact to light touch.     No pain with IR/ER of both hips.   Gait is normal.    Medical Decision Making  Imaging: none   Assessment and Plan: Ms. [name] has intermittent LBP with intermittent bilateral leg pain- legs feel heavy and like they weigh 500 lbs. Legs feel like they will give way with walking and feel shaky. She only has back and leg pain with walking or standing. Pain improves once she stops to sit.   MRI from last year showed disc bulge at L4-L5 that may be contacting L5 nerve. Disc bulge L5-S1 as well as facet hypertrophy L4-S1. No spinal stenosis noted, but her symptoms are consistent with this.    Treatment options discussed with patient and following plan made:    - Discussed PT for lumbar spine. She declines.  - Referral to pain management ([facility]) to discuss lumbar injections.  - May consider updated lumbar MRI if no improvement with above.  - Follow  up with me in 6-8 weeks and prn.   I spent a total of 20 minutes in face-to-face and non-face-to-face activities related to this patient's care today including review of outside records, review of imaging, review of symptoms, physical exam, discussion of differential diagnosis, discussion of treatment options, and documentation.   Drake Leach PA-C Dept. of Neurosurgery

## 2024-01-21 NOTE — Telephone Encounter (Signed)
**Note De-identified  [place] Obfuscation** Please advise 

## 2024-01-22 ENCOUNTER — Ambulatory Visit: Payer: PRIVATE HEALTH INSURANCE | Admitting: Orthopedic Surgery

## 2024-01-22 ENCOUNTER — Encounter: Payer: Self-pay | Admitting: Orthopedic Surgery

## 2024-01-22 VITALS — BP 128/78 | Ht 66.0 in | Wt 165.0 lb

## 2024-01-22 DIAGNOSIS — M4726 Other spondylosis with radiculopathy, lumbar region: Secondary | ICD-10-CM

## 2024-01-22 DIAGNOSIS — M5126 Other intervertebral disc displacement, lumbar region: Secondary | ICD-10-CM | POA: Diagnosis not present

## 2024-01-22 DIAGNOSIS — M5416 Radiculopathy, lumbar region: Secondary | ICD-10-CM

## 2024-01-22 DIAGNOSIS — M47816 Spondylosis without myelopathy or radiculopathy, lumbar region: Secondary | ICD-10-CM

## 2024-01-22 NOTE — Patient Instructions (Signed)
 It was so nice to see you today. Thank you so much for coming in.    You have some wear and tear in your back and this is likely causing your pain.   I want you to see pain management here in [place] (Dr. Cherylann Ratel) to discuss possible lumbar injections. They should call you to schedule an appointment or you can call them at [phone].   I will see you back in 6-8 weeks. Please do not hesitate to call if you have any questions or concerns. You can also message me in MyChart.   Drake Leach PA-C [phone]     The physicians and staff at [facility] at [facility] are committed to providing excellent care. You may receive a survey asking for feedback about your experience at our office. We value you your feedback and appreciate you taking the time to to fill it out. The [facility] leadership team is also available to discuss your experience in person, feel free to contact us [phone].

## 2024-01-28 LAB — LDL CHOLESTEROL, DIRECT: LDL Direct: 59 mg/dL (ref 0–99)

## 2024-01-28 LAB — SPECIMEN STATUS REPORT

## 2024-02-09 ENCOUNTER — Other Ambulatory Visit (INDEPENDENT_AMBULATORY_CARE_PROVIDER_SITE_OTHER): Payer: Self-pay | Admitting: Vascular Surgery

## 2024-02-26 ENCOUNTER — Ambulatory Visit
Payer: PRIVATE HEALTH INSURANCE | Attending: Student in an Organized Health Care Education/Training Program | Admitting: Student in an Organized Health Care Education/Training Program

## 2024-02-26 ENCOUNTER — Encounter: Payer: Self-pay | Admitting: Student in an Organized Health Care Education/Training Program

## 2024-02-26 VITALS — BP 146/67 | HR 98 | Temp 97.2°F | Resp 18 | Ht 66.0 in | Wt 165.0 lb

## 2024-02-26 DIAGNOSIS — I70219 Atherosclerosis of native arteries of extremities with intermittent claudication, unspecified extremity: Secondary | ICD-10-CM

## 2024-02-26 DIAGNOSIS — M79605 Pain in left leg: Secondary | ICD-10-CM | POA: Diagnosis present

## 2024-02-26 DIAGNOSIS — M545 Low back pain, unspecified: Secondary | ICD-10-CM | POA: Diagnosis present

## 2024-02-26 DIAGNOSIS — M79604 Pain in right leg: Secondary | ICD-10-CM

## 2024-02-26 DIAGNOSIS — G8929 Other chronic pain: Secondary | ICD-10-CM | POA: Diagnosis present

## 2024-02-26 NOTE — Progress Notes (Signed)
 Safety precautions to be maintained throughout the outpatient stay will include: orient to surroundings, keep bed in low position, maintain call bell within reach at all times, provide assistance with transfer out of bed and ambulation.

## 2024-02-26 NOTE — Progress Notes (Signed)
 PROVIDER NOTE: Interpretation of information contained herein should be left to medically-trained personnel. Specific patient instructions are provided elsewhere under "Patient Instructions" section of medical record. This document was created in part using AI and STT-dictation technology, any transcriptional errors that may result from this process are unintentional.  Patient: Cassandra Butler  Service: E/M Encounter  Provider: Cephus Collin, MD  DOB: [birth year]  Delivery: Face-to-face  Specialty: Interventional Pain Management  MRN: [id number]  Setting: Ambulatory outpatient facility  Specialty designation: 09  Type: New Patient  Location: Outpatient office facility  PCP: Mazie Speed, MD  DOS: 02/26/2024    Referring Prov.: Lucetta Russel, PA-C   Primary Reason(s) for Visit: Encounter for initial evaluation of one or more chronic problems (new to examiner) potentially causing chronic pain, and posing a threat to normal musculoskeletal function. (Level of risk: High) CC: Leg Pain (Bilateral anterior ) and Back Pain (Bilateral )  HPI  Cassandra Butler is a 58 y.o. year old, female patient, who comes for the first time to our practice referred by Lucetta Russel, PA-C for our initial evaluation of her chronic pain. She has GAD (generalized anxiety disorder); Acid reflux; Hypertension associated with diabetes (HCC); Family history of cardiovascular disease; Hyperlipidemia associated with type 2 diabetes mellitus (HCC); Obstructive sleep apnea of adult; Current tobacco use; Avitaminosis D; Type 2 diabetes mellitus with hyperglycemia (HCC); History of breast cancer; Osteopenia of multiple sites; Hordeolum internum of right upper eyelid; Hypertriglyceridemia; Lumbar radiculopathy; Thrombocytopenia (HCC); Carotid stenosis; Claudication (HCC); Carotid stenosis, right; Stage 3a chronic kidney disease (HCC); Anemia; Chronic bilateral low back pain without sciatica; Bilateral leg pain; and Atherosclerotic peripheral  vascular disease with intermittent claudication (HCC) on their problem list. Today she comes in for evaluation of her Leg Pain (Bilateral anterior ) and Back Pain (Bilateral )  Pain Assessment: Location: Lower, Right, Left Back Radiating:   Onset: More than a month ago Duration: Chronic pain Quality: Constant, Spasm, Heaviness, Throbbing, Shooting, Aching Severity: 5 /10 (subjective, self-reported pain score)  Effect on ADL: Limits ADLs. Hard to walk long distances or walking in general. Timing: Constant Modifying factors: Sitting down helps with leg pain. Nothing helps back pain. BP: (!) 146/67  HR: 98  Onset and Duration: Gradual Cause of pain: Unknown Severity: Getting worse, NAS-11 at its worse: 8/10, NAS-11 at its best: 5/10, NAS-11 now: 7/10, and NAS-11 on the average: 7/10 Timing: Not influenced by the time of the day Aggravating Factors: Bending, Climbing, Kneeling, Lifiting, Nerve blocks, Prolonged sitting, Squatting, Stooping , Walking, Walking uphill, and Walking downhill Alleviating Factors: Resting and Sleeping Associated Problems: Fatigue, Numbness, and Spasms Quality of Pain: Aching, Agonizing, Constant, Pulsating, Shooting, and Throbbing Previous Examinations or Tests: MRI scan and X-rays Previous Treatments: The patient denies none noted  Cassandra Butler is being evaluated for possible interventional pain management therapies for the treatment of her chronic pain.   Discussed the use of AI scribe software for clinical note transcription with the patient, who gave verbal consent to proceed.  History of Present Illness   Cassandra Butler is a 58 year old female with vascular disease (hx of carotid stenosis and right carotid stent) who presents with worsening leg pain and heaviness. She was referred by Ansel Kingdom for pain management evaluation.  Her leg symptoms began a year ago, initially presenting as a sensation of heaviness, described as feeling like she weighed 'a  hundred pounds' when walking. Over time, the symptoms have worsened, now including pain in addition to the  heaviness. The pain has progressed to involve her back and hip, with radiation down her legs.  She underwent an MRI a year ago, which was interpreted as showing age-related changes. Despite this, her symptoms have continued to worsen, prompting her referral to pain management.  She describes significant difficulty walking, stating that she cannot walk from her location to her car without needing to sit due to severe pain. She experiences leg spasms after sitting for about 20 minutes, which resolve after standing for a short period.  Her past medical history is significant for vascular disease, including a carotid stent placement in October of the previous year. She has a family history of similar vascular issues, as her father underwent similar procedures.  She is currently taking methotrexate for rheumatoid arthritis. She smokes about a pack of cigarettes a day.       Meds   Current Outpatient Medications:    aspirin 81 MG chewable tablet, Chew by mouth daily., Disp: , Rfl:    Blood Glucose Monitoring Suppl (ONE TOUCH ULTRA 2) w/Device KIT, Test blood glucose once daily., Disp: 1 kit, Rfl: 0   clopidogrel (PLAVIX) 75 MG tablet, TAKE 1 TABLET(75 MG) BY MOUTH DAILY, Disp: 30 tablet, Rfl: 6   co-enzyme Q-10 30 MG capsule, Take 1 capsule (30 mg total) by mouth 3 (three) times daily., Disp: 30 capsule, Rfl: 3   fenofibrate (TRICOR) 145 MG tablet, Take 1 tablet (145 mg total) by mouth daily., Disp: 90 tablet, Rfl: 1   glucose blood (ONETOUCH ULTRA) test strip, Use as instructed, Disp: 100 each, Rfl: 12   metFORMIN (GLUCOPHAGE-XR) 500 MG 24 hr tablet, Take 1 tablet (500 mg total) by mouth 2 (two) times daily with a meal., Disp: 180 tablet, Rfl: 1   methotrexate (RHEUMATREX) 2.5 MG tablet, Take 15 mg by mouth once a week., Disp: , Rfl:    omeprazole (PRILOSEC) 20 MG capsule, TAKE 1 CAPSULE(20  MG) BY MOUTH DAILY, Disp: 90 capsule, Rfl: 3   OneTouch Delica Lancets 33G MISC, Use as directed, Disp: 100 each, Rfl: 3   rosuvastatin (CRESTOR) 5 MG tablet, Take 1 tablet (5 mg total) by mouth daily., Disp: 90 tablet, Rfl: 3   sertraline (ZOLOFT) 100 MG tablet, TAKE 1 TABLET(100 MG) BY MOUTH DAILY, Disp: 90 tablet, Rfl: 1   Vitamin D, Ergocalciferol, (DRISDOL) 1.25 MG (50000 UNIT) CAPS capsule, Take 1 capsule (50,000 Units total) by mouth every 7 (seven) days., Disp: 12 capsule, Rfl: 0  Imaging Review   MR LUMBAR SPINE WO CONTRAST  Narrative CLINICAL DATA:  Lumbar radiculopathy, low back pain going into both legs for 6 weeks  EXAM: MRI LUMBAR SPINE WITHOUT CONTRAST  TECHNIQUE: Multiplanar, multisequence MR imaging of the lumbar spine was performed. No intravenous contrast was administered.  COMPARISON:  None Available.  FINDINGS: Segmentation:  Standard.  Alignment:  Physiologic.  Vertebrae: No acute fracture, evidence of discitis, or aggressive bone lesion.  Conus medullaris and cauda equina: Conus extends to the T12-L1 level. Conus and cauda equina appear normal.  Paraspinal and other soft tissues: No acute paraspinal abnormality. Severe left hydroureteronephrosis with severe renal cortical thinning.  Disc levels:  Disc spaces: Disc desiccation at L2-3, L4-5 and to lesser extent L3-4.  T12-L1: No significant disc bulge. No neural foraminal stenosis. No central canal stenosis.  L1-L2: No significant disc bulge. No neural foraminal stenosis. No central canal stenosis.  L2-L3: Mild broad-based disc bulge. No foraminal or central canal stenosis.  L3-L4: No significant disc bulge. No  neural foraminal stenosis. No central canal stenosis.  L4-L5: Broad-based disc bulge with a broad shallow left subarticular disc protrusion contacting the left intraspinal L5 nerve root. Mild bilateral facet arthropathy. No foraminal or central canal stenosis.  L5-S1: Minimal  broad-based disc bulge. Moderate right and mild left facet arthropathy. No foraminal stenosis.  IMPRESSION: 1. At L4-5 there is a broad-based disc bulge with a broad shallow left subarticular disc protrusion contacting the left intraspinal L5 nerve root. Mild bilateral facet arthropathy. 2. No acute osseous injury of the lumbar spine. 3. Severe chronic left hydroureteronephrosis.   Electronically Signed By: Elige Ko M.D. On: 02/05/2023 09:58    DG Lumbar Spine Complete  Narrative CLINICAL DATA:  Low back pain.  EXAM: LUMBAR SPINE - COMPLETE 4+ VIEW  COMPARISON:  None.  FINDINGS: Five lumbar type vertebral bodies are well visualized. Vertebral body height is well maintained. No pars defects are noted. Mild facet hypertrophic changes are seen. No soft tissue abnormality is seen. Diffuse aortic calcifications are noted.  IMPRESSION: Mild degenerative change without acute abnormality.   Electronically Signed By: Alcide Clever M.D. On: 02/16/2022 02:35   Complexity Note: Imaging results reviewed.                         ROS  Cardiovascular: Daily Aspirin intake, Heart murmur, and Blood thinners:  Anticoagulant Pulmonary or Respiratory: Shortness of breath, Smoking, Snoring , and Temporary stoppage of breathing during sleep Neurological: No reported neurological signs or symptoms such as seizures, abnormal skin sensations, urinary and/or fecal incontinence, being born with an abnormal open spine and/or a tethered spinal cord Psychological-Psychiatric: Anxiousness Gastrointestinal: Reflux or heatburn Genitourinary: No reported renal or genitourinary signs or symptoms such as difficulty voiding or producing urine, peeing blood, non-functioning kidney, kidney stones, difficulty emptying the bladder, difficulty controlling the flow of urine, or chronic kidney disease Hematological: Brusing easily Endocrine: High blood sugar controlled without the use of insulin  (NIDDM) Rheumatologic: Rheumatoid arthritis Musculoskeletal: Negative for myasthenia gravis, muscular dystrophy, multiple sclerosis or malignant hyperthermia Work History: Working full time  Allergies  Cassandra Butler has no known allergies.  Laboratory Chemistry Profile   Renal Lab Results  Component Value Date   BUN 17 01/16/2024   CREATININE 0.97 01/16/2024   BCR 18 01/16/2024   GFRAA >60 10/12/2019   GFRNONAA 58 (L) 08/05/2023   SPECGRAV 1.020 05/14/2023   PHUR 6.0 05/14/2023   PROTEINUR Positive (A) 05/14/2023     Electrolytes Lab Results  Component Value Date   NA 132 (L) 01/16/2024   K 4.7 01/16/2024   CL 93 (L) 01/16/2024   CALCIUM 9.8 01/16/2024     Hepatic Lab Results  Component Value Date   AST 23 01/16/2024   ALT 23 01/16/2024   ALBUMIN 4.5 01/16/2024   ALKPHOS 102 01/16/2024   LIPASE 36 07/02/2021     ID No results found for: "LYMEIGGIGMAB", "HIV", "SARSCOV2NAA", "STAPHAUREUS", "MRSAPCR", "HCVAB", "PREGTESTUR", "RMSFIGG", "QFVRPH1IGG", "QFVRPH2IGG"   Bone Lab Results  Component Value Date   VD25OH 18.7 (L) 01/16/2024     Endocrine Lab Results  Component Value Date   GLUCOSE 393 (H) 01/16/2024   GLUCOSEU NEGATIVE 10/12/2019   HGBA1C 12.2 (A) 01/16/2024   TSH 1.510 07/28/2019     Neuropathy Lab Results  Component Value Date   HGBA1C 12.2 (A) 01/16/2024     CNS No results found for: "COLORCSF", "APPEARCSF", "RBCCOUNTCSF", "WBCCSF", "POLYSCSF", "LYMPHSCSF", "EOSCSF", "PROTEINCSF", "GLUCCSF", "JCVIRUS", "CSFOLI", "IGGCSF", "LABACHR", "  ACETBL"   Inflammation (CRP: Acute  ESR: Chronic) Lab Results  Component Value Date   CRP 2 12/24/2022   ESRSEDRATE 33 12/24/2022     Rheumatology Lab Results  Component Value Date   RF 13.9 12/24/2022   ANA Negative 12/24/2022     Coagulation Lab Results  Component Value Date   INR 1.07 01/27/2017   LABPROT 13.9 01/27/2017   APTT 26 01/27/2017   PLT 88 (LL) 01/16/2024   LABHEMA Note: 01/16/2024      Cardiovascular Lab Results  Component Value Date   BNP 56 03/28/2014   CKTOTAL 72 03/28/2014   CKMB 0.5 03/28/2014   TROPONINI <0.03 01/27/2017   HGB 12.8 01/16/2024   HCT 39.7 01/16/2024     Screening No results found for: "SARSCOV2NAA", "COVIDSOURCE", "STAPHAUREUS", "MRSAPCR", "HCVAB", "HIV", "PREGTESTUR"   Cancer No results found for: "CEA", "CA125", "LABCA2"   Allergens No results found for: "ALMOND", "APPLE", "ASPARAGUS", "AVOCADO", "BANANA", "BARLEY", "BASIL", "BAYLEAF", "GREENBEAN", "LIMABEAN", "WHITEBEAN", "BEEFIGE", "REDBEET", "BLUEBERRY", "BROCCOLI", "CABBAGE", "MELON", "CARROT", "CASEIN", "CASHEWNUT", "CAULIFLOWER", "CELERY"     Note: Lab results reviewed.  PFSH  Drug: Cassandra Butler  reports no history of drug use. Alcohol:  reports no history of alcohol use. Tobacco:  reports that she has been smoking cigarettes. She has never used smokeless tobacco. Medical:  has a past medical history of Cancer (HCC) (11/14/2007), Diabetes mellitus without complication (HCC), GERD (gastroesophageal reflux disease), Hyperlipidemia, Hypertension, and Vitamin D deficiency. Family: family history includes Diabetes in her mother; Heart disease in her father and sister; Stroke in her mother.  Past Surgical History:  Procedure Laterality Date   ABDOMINAL HYSTERECTOMY  2004   total, due to fibriods. Also had ovaries removed after 1 year of breast cancer.    BREAST LUMPECTOMY Left 2010   CAROTID PTA/STENT INTERVENTION Right 08/04/2023   Procedure: CAROTID PTA/STENT INTERVENTION;  Surgeon: Celso College, MD;  Location: [facility] INVASIVE CV [facility];  Service: Cardiovascular;  Laterality: Right;   COLONOSCOPY     25 yrs ago   COLONOSCOPY  04/16/2010   Dr Janine Melbourne   Kidney malfunction  Left    Nonfunctional : twisted during 2nd hysterectomy   Active Ambulatory Problems    Diagnosis Date Noted   GAD (generalized anxiety disorder) 12/18/2004   Acid reflux 12/18/2004   Hypertension associated with  diabetes (HCC) 08/07/2009   Family history of cardiovascular disease 02/09/2008   Hyperlipidemia associated with type 2 diabetes mellitus (HCC) 04/04/2015   Obstructive sleep apnea of adult 04/04/2015   Current tobacco use 01/27/2008   Avitaminosis D 02/14/2010   Type 2 diabetes mellitus with hyperglycemia (HCC) 02/01/2016   History of breast cancer 05/18/2012   Osteopenia of multiple sites 10/08/2016   Hordeolum internum of right upper eyelid 04/09/2022   Hypertriglyceridemia 12/31/2022   Lumbar radiculopathy 12/31/2022   Thrombocytopenia (HCC) 07/03/2023   Carotid stenosis 07/22/2023   Claudication (HCC) 07/22/2023   Carotid stenosis, right 08/04/2023   Stage 3a chronic kidney disease (HCC) 01/16/2024   Anemia 01/16/2024   Chronic bilateral low back pain without sciatica 02/26/2024   Bilateral leg pain 02/26/2024   Atherosclerotic peripheral vascular disease with intermittent claudication (HCC) 02/26/2024   Resolved Ambulatory Problems    Diagnosis Date Noted   Breast CA (HCC) 04/04/2015   Diabetes (HCC) 03/15/2009   Hypertriglyceridemia 04/04/2015   Aromatase inhibitor use 10/08/2016   Anal fissure 01/15/2017   Intermittent claudication (HCC) 03/19/2022   Periorbital cellulitis of right eye 04/09/2022   Itchy eyes 04/09/2022  Essential (primary) hypertension 05/14/2023   Dysuria 05/14/2023   Past Medical History:  Diagnosis Date   Cancer (HCC) 11/14/2007   Diabetes mellitus without complication (HCC)    GERD (gastroesophageal reflux disease)    Hyperlipidemia    Hypertension    Vitamin D deficiency    Constitutional Exam  General appearance: Well nourished, well developed, and well hydrated. In no apparent acute distress Vitals:   02/26/24 0802  BP: (!) 146/67  Pulse: 98  Resp: 18  Temp: (!) 97.2 F (36.2 C)  TempSrc: Temporal  SpO2: 99%  Weight: 165 lb (74.8 kg)  Height: 5\' 6"  (1.676 m)   BMI Assessment: Estimated body mass index is 26.63 kg/m as  calculated from the following:   Height as of this encounter: 5\' 6"  (1.676 m).   Weight as of this encounter: 165 lb (74.8 kg).  BMI interpretation table: BMI level Category Range association with higher incidence of chronic pain  <18 kg/m2 Underweight   18.5-24.9 kg/m2 Ideal body weight   25-29.9 kg/m2 Overweight Increased incidence by 20%  30-34.9 kg/m2 Obese (Class I) Increased incidence by 68%  35-39.9 kg/m2 Severe obesity (Class II) Increased incidence by 136%  >40 kg/m2 Extreme obesity (Class III) Increased incidence by 254%   Patient's current BMI Ideal Body weight  Body mass index is 26.63 kg/m. Ideal body weight: 59.3 kg (130 lb 11.7 oz) Adjusted ideal body weight: 65.5 kg (144 lb 7 oz)   BMI Readings from Last 4 Encounters:  02/26/24 26.63 kg/m  01/22/24 26.63 kg/m  01/16/24 26.95 kg/m  12/10/23 27.25 kg/m   Wt Readings from Last 4 Encounters:  02/26/24 165 lb (74.8 kg)  01/22/24 165 lb (74.8 kg)  01/16/24 167 lb (75.8 kg)  12/10/23 168 lb 12.8 oz (76.6 kg)    Psych/Mental status: Alert, oriented x 3 (person, place, & time)       Eyes: PERLA Respiratory: No evidence of acute respiratory distress  Thoracic Spine Area Exam  Skin & Axial Inspection: No masses, redness, or swelling Alignment: Symmetrical Functional ROM: Unrestricted ROM Stability: No instability detected Muscle Tone/Strength: Functionally intact. No obvious neuro-muscular anomalies detected. Sensory (Neurological): Unimpaired Muscle strength & Tone: No palpable anomalies Lumbar Spine Area Exam  Skin & Axial Inspection: No masses, redness, or swelling Alignment: Symmetrical Functional ROM: Unrestricted ROM       Stability: No instability detected Muscle Tone/Strength: Functionally intact. No obvious neuro-muscular anomalies detected. Sensory (Neurological): Unimpaired Palpation: No palpable anomalies       Provocative Tests: Hyperextension/rotation test: deferred today       Lumbar  quadrant test (Kemp's test): deferred today       Lateral bending test: deferred today       Patrick's Maneuver: deferred today                   FABER* test: deferred today                   S-I anterior distraction/compression test: deferred today         S-I lateral compression test: deferred today         S-I Thigh-thrust test: deferred today         S-I Gaenslen's test: deferred today         *(Flexion, ABduction and External Rotation) Gait & Posture Assessment  Ambulation: Unassisted Gait: Relatively normal for age and body habitus Posture: WNL  Lower Extremity Exam    Side: Right lower extremity  Side: Left lower extremity  Stability: No instability observed          Stability: No instability observed          Skin & Extremity Inspection: Skin color, temperature, and hair growth are WNL. No peripheral edema or cyanosis. No masses, redness, swelling, asymmetry, or associated skin lesions. No contractures.  Skin & Extremity Inspection: Skin color, temperature, and hair growth are WNL. No peripheral edema or cyanosis. No masses, redness, swelling, asymmetry, or associated skin lesions. No contractures.  Functional ROM: Unrestricted ROM                  Functional ROM: Unrestricted ROM                  Muscle Tone/Strength: Functionally intact. No obvious neuro-muscular anomalies detected.  Muscle Tone/Strength: Functionally intact. No obvious neuro-muscular anomalies detected.  Sensory (Neurological): Unimpaired        Sensory (Neurological): Unimpaired        DTR: Patellar: deferred today Achilles: deferred today Plantar: deferred today  DTR: Patellar: deferred today Achilles: deferred today Plantar: deferred today  Palpation: No palpable anomalies  Palpation: No palpable anomalies    Assessment  Primary Diagnosis & Pertinent Problem List: The primary encounter diagnosis was Chronic bilateral low back pain without sciatica. Diagnoses of Bilateral leg pain and Atherosclerotic  peripheral vascular disease with intermittent claudication (HCC) were also pertinent to this visit.  Visit Diagnosis (New problems to examiner): 1. Chronic bilateral low back pain without sciatica   2. Bilateral leg pain   3. Atherosclerotic peripheral vascular disease with intermittent claudication (HCC)    Plan of Care (Initial workup plan)   Assessment and Plan    Vascular Claudication   She presents with heaviness and pain in the legs, worsened by walking and relieved by rest, indicative of claudication. Symptoms have deteriorated over the past year, suggesting a vascular cause rather than spinal, given the absence of significant spinal stenosis on MRI and her history of vascular disease. The symptoms align with vascular claudication due to reduced blood flow, likely related to her known vascular disease and smoking history. Await results of the vascular workup scheduled for May 2nd. If the workup is negative, consider an epidural steroid injection for disc herniation if symptoms persist. Advise smoking cessation to improve vascular health.  Disc Herniation   MRI reveals a disc herniation in the lower back affecting the nerve root, potentially causing pain radiating down the left leg but unlikely to cause heaviness and claudication symptoms. There is low suspicion that the back is the primary source of symptoms. Consider an epidural steroid injection if the vascular workup is negative and symptoms persist.  Rheumatoid Arthritis   Rheumatoid arthritis may contribute to back pain. She is currently on methotrexate for management.  Smoking   She smokes about a pack a day, likely contributing to vascular issues. Advise smoking cessation to improve vascular health.      Smoking cessation counseling: Pt acknowledges the risks of long term smoking, she will try to quit smoking. Options for different medications including nicotine products, chewing gum, patch etc, Wellbutrin and Chantix is  discussed    Provider-requested follow-up: No follow-ups on file.  Future Appointments  Date Time Provider Department Center  03/12/2024  7:30 AM [facility] 3 [facility] None  03/12/2024  8:30 AM Dew, Donald Frost, MD [facility] None  03/19/2024  9:00 AM Lucetta Russel, PA-C [facility] None  04/16/2024  9:00 AM Bacigalupo, Stan Eans,  MD [facility]   I discussed the assessment and treatment plan with the patient. The patient was provided an opportunity to ask questions and all were answered. The patient agreed with the plan and demonstrated an understanding of the instructions.  Patient advised to call back or seek an in-person evaluation if the symptoms or condition worsens.  Duration of encounter: .  Total time on encounter, as per AMA guidelines included both the face-to-face and non-face-to-face time personally spent by the physician and/or other qualified health care professional(s) on the day of the encounter (includes time in activities that require the physician or other qualified health care professional and does not include time in activities normally performed by clinical staff). Physician's time may include the following activities when performed: Preparing to see the patient (e.g., pre-charting review of records, searching for previously ordered imaging, lab work, and nerve conduction tests) Review of prior analgesic pharmacotherapies. Reviewing PMP Interpreting ordered tests (e.g., lab work, imaging, nerve conduction tests) Performing post-procedure evaluations, including interpretation of diagnostic procedures Obtaining and/or reviewing separately obtained history Performing a medically appropriate examination and/or evaluation Counseling and educating the patient/family/caregiver Ordering medications, tests, or procedures Referring and communicating with other health care professionals (when not separately reported) Documenting clinical information in the electronic or other health  record Independently interpreting results (not separately reported) and communicating results to the patient/ family/caregiver Care coordination (not separately reported)  Note by: Cephus Collin, MD (TTS and AI technology used. I apologize for any typographical errors that were not detected and corrected.) Date: 02/26/2024; Time: 8:46 AM

## 2024-03-08 ENCOUNTER — Other Ambulatory Visit (INDEPENDENT_AMBULATORY_CARE_PROVIDER_SITE_OTHER): Payer: Self-pay | Admitting: Nurse Practitioner

## 2024-03-08 DIAGNOSIS — I6523 Occlusion and stenosis of bilateral carotid arteries: Secondary | ICD-10-CM

## 2024-03-12 ENCOUNTER — Encounter (INDEPENDENT_AMBULATORY_CARE_PROVIDER_SITE_OTHER): Payer: PRIVATE HEALTH INSURANCE

## 2024-03-12 ENCOUNTER — Ambulatory Visit (INDEPENDENT_AMBULATORY_CARE_PROVIDER_SITE_OTHER): Payer: PRIVATE HEALTH INSURANCE

## 2024-03-12 ENCOUNTER — Ambulatory Visit (INDEPENDENT_AMBULATORY_CARE_PROVIDER_SITE_OTHER): Payer: PRIVATE HEALTH INSURANCE | Admitting: Vascular Surgery

## 2024-03-12 ENCOUNTER — Encounter (INDEPENDENT_AMBULATORY_CARE_PROVIDER_SITE_OTHER): Payer: Self-pay | Admitting: Vascular Surgery

## 2024-03-12 VITALS — BP 134/82 | HR 90 | Resp 18 | Ht 66.0 in | Wt 166.8 lb

## 2024-03-12 DIAGNOSIS — E1165 Type 2 diabetes mellitus with hyperglycemia: Secondary | ICD-10-CM

## 2024-03-12 DIAGNOSIS — I152 Hypertension secondary to endocrine disorders: Secondary | ICD-10-CM

## 2024-03-12 DIAGNOSIS — I739 Peripheral vascular disease, unspecified: Secondary | ICD-10-CM | POA: Diagnosis not present

## 2024-03-12 DIAGNOSIS — I6523 Occlusion and stenosis of bilateral carotid arteries: Secondary | ICD-10-CM

## 2024-03-12 DIAGNOSIS — E1159 Type 2 diabetes mellitus with other circulatory complications: Secondary | ICD-10-CM

## 2024-03-12 DIAGNOSIS — I6521 Occlusion and stenosis of right carotid artery: Secondary | ICD-10-CM | POA: Diagnosis not present

## 2024-03-12 NOTE — Progress Notes (Signed)
 Subjective:    Patient ID: Cassandra Butler, female    DOB: 11-05-1966, 58 y.o.   MRN: [id number] Chief Complaint  Patient presents with   Follow-up    F/u 3 months Carotid  JD    Cassandra Butler is a 58 year old female who presents to clinic today for follow-up of her carotid stenosis as well as bilateral lower extremity pain and weakness.  Patient denies any neurological symptoms today.  She denies any recent TIAs, CVAs or hemiplegic weakness.  She denies any recent dizziness or blurred vision.  However she does endorse that her bilateral lower extremities feel weaker and she is having pain at rest as well as on ambulation.  She states she is having difficulty walking longer distances as prior.  On her last visit there was a question of this being partly neurogenic.  Patient endorses she has seen neurosurgery at this time they do not wish to repeat MRI of her spine.  She states she been told it is too early to repeat the MRI based on her current symptoms.    Review of Systems  Constitutional: Negative.   HENT: Negative.    Respiratory: Negative.    Cardiovascular: Negative.   Gastrointestinal: Negative.   Genitourinary: Negative.   Musculoskeletal:  Positive for back pain and gait problem.  Skin: Negative.   Neurological:  Positive for weakness and numbness.       Patient endorses bilateral leg weakness with some intermittent numbness and tingling  Psychiatric/Behavioral: Negative.    All other systems reviewed and are negative.      Objective:   Physical Exam Vitals reviewed.  Constitutional:      Appearance: Normal appearance.  HENT:     Head: Normocephalic.  Eyes:     Pupils: Pupils are equal, round, and reactive to light.  Cardiovascular:     Rate and Rhythm: Normal rate and regular rhythm.     Pulses: Normal pulses.     Heart sounds: Normal heart sounds.  Pulmonary:     Effort: Pulmonary effort is normal.     Breath sounds: Normal breath sounds.  Abdominal:      General: Abdomen is flat. Bowel sounds are normal.     Palpations: Abdomen is soft.  Musculoskeletal:        General: Normal range of motion.  Skin:    General: Skin is warm and dry.  Neurological:     General: No focal deficit present.     Mental Status: She is alert and oriented to person, place, and time.  Psychiatric:        Mood and Affect: Mood normal.        Behavior: Behavior normal.        Thought Content: Thought content normal.        Judgment: Judgment normal.     BP 134/82   Pulse 90   Resp 18   Ht 5\' 6"  (1.676 m)   Wt 166 lb 12.8 oz (75.7 kg)   BMI 26.92 kg/m   Past Medical History:  Diagnosis Date   Cancer (HCC) 11/14/2007   Left breast wide excision, sentinel node biopsy. 1.4 cm, T1c, N0: ER 90%; PR 905; Her 2 neu not over expressed.    Diabetes mellitus without complication (HCC)    GERD (gastroesophageal reflux disease)    Hyperlipidemia    Hypertension    Vitamin D  deficiency     Social History   Socioeconomic History   Marital status: Married  Spouse name: Blaise Bumps   Number of children: 2   Years of education: Not on file   Highest education level: Not on file  Occupational History   Not on file  Tobacco Use   Smoking status: Every Day    Current packs/day: 1.00    Types: Cigarettes   Smokeless tobacco: Never  Vaping Use   Vaping status: Never Used  Substance and Sexual Activity   Alcohol use: No   Drug use: No   Sexual activity: Not on file  Other Topics Concern   Not on file  Social History Narrative   Lives at home with spouse: 2 children (adults) live locally    Social Drivers of Corporate investment banker Strain: Not on file  Food Insecurity: Not on file  Transportation Needs: Not on file  Physical Activity: Not on file  Stress: Not on file  Social Connections: Not on file  Intimate Partner Violence: Not on file    Past Surgical History:  Procedure Laterality Date   ABDOMINAL HYSTERECTOMY  2004   total, due to  fibriods. Also had ovaries removed after 1 year of breast cancer.    BREAST LUMPECTOMY Left 2010   CAROTID PTA/STENT INTERVENTION Right 08/04/2023   Procedure: CAROTID PTA/STENT INTERVENTION;  Surgeon: Celso College, MD;  Location: [facility] INVASIVE CV LAB;  Service: Cardiovascular;  Laterality: Right;   COLONOSCOPY     25 yrs ago   COLONOSCOPY  04/16/2010   Dr Janine Melbourne   Kidney malfunction  Left    Nonfunctional : twisted during 2nd hysterectomy    Family History  Problem Relation Age of Onset   Stroke Mother    Diabetes Mother    Heart disease Father    Heart disease Sister     No Known Allergies     Latest Ref Rng & Units 01/16/2024    9:45 AM 08/05/2023    3:48 AM 07/03/2023    9:55 AM  CBC  WBC 3.4 - 10.8 x10E3/uL 3.6  3.4  4.9   Hemoglobin 11.1 - 15.9 g/dL 16.1  09.6  04.5   Hematocrit 34.0 - 46.6 % 39.7  30.8  39.9   Platelets 150 - 450 x10E3/uL 88  72  95       CMP     Component Value Date/Time   NA 132 (L) 01/16/2024 0945   NA 136 02/17/2015 1423   K 4.7 01/16/2024 0945   K 4.1 02/17/2015 1423   CL 93 (L) 01/16/2024 0945   CL 102 02/17/2015 1423   CO2 22 01/16/2024 0945   CO2 28 02/17/2015 1423   GLUCOSE 393 (H) 01/16/2024 0945   GLUCOSE 207 (H) 08/05/2023 0348   GLUCOSE 139 (H) 02/17/2015 1423   BUN 17 01/16/2024 0945   BUN 12 02/17/2015 1423   CREATININE 0.97 01/16/2024 0945   CREATININE 0.88 02/17/2015 1423   CALCIUM  9.8 01/16/2024 0945   CALCIUM  9.3 02/17/2015 1423   PROT 6.9 01/16/2024 0945   PROT 7.4 02/17/2015 1423   ALBUMIN 4.5 01/16/2024 0945   ALBUMIN 4.2 02/17/2015 1423   AST 23 01/16/2024 0945   AST 29 02/17/2015 1423   ALT 23 01/16/2024 0945   ALT 30 02/17/2015 1423   ALKPHOS 102 01/16/2024 0945   ALKPHOS 81 02/17/2015 1423   BILITOT 0.7 01/16/2024 0945   BILITOT 0.4 02/17/2015 1423   EGFR 69 01/16/2024 0945   GFRNONAA 58 (L) 08/05/2023 0348   GFRNONAA >60 02/17/2015 1423  No results found.     Assessment & Plan:   1.  Claudication [facility]) (Primary) Patient returns to clinic today with complaints of increasing pain and weakness to her bilateral lower extremities.  She endorses that the right is worse than the left.  She is unable to walk more than 200 feet at this time without having severe pain.  She also endorses resting pain most notably at night to both of her extremities and again right greater than left.  She has no noted open sores or wounds at this time.  She endorses she has gone back to seeing neurosurgery who does not wish to MRI her back again for any neurological issues at this time.  Therefore I recommend due to her increasing ambulatory and rest pain to bilateral lower extremities right greater than left an angiogram of her right lower extremity with possible intervention.  We discussed today in detail the procedure, benefits, risk, complications.  I also discussed the risk of acute kidney injury due to the use of IV contrast dye as she only has 1 functioning kidney..  She verbalizes her understanding and wishes to proceed.  Will schedule her as outpatient accordingly.  2. Type 2 diabetes mellitus with hyperglycemia, without long-term current use of insulin  (HCC) Continue hypoglycemic medications as already ordered, these medications have been reviewed and there are no changes at this time.  Hgb A1C to be monitored as already arranged by primary service  3. Hypertension associated with diabetes (HCC) Continue antihypertensive medications as already ordered, these medications have been reviewed and there are no changes at this time.  4. Carotid stenosis, right The patient recently had a carotid duplex. We are closely following this and she will return in 3 months. We will follow-up the status of her legs at that time   Current Outpatient Medications on File Prior to Visit  Medication Sig Dispense Refill   aspirin  81 MG chewable tablet Chew by mouth daily.     Blood Glucose Monitoring Suppl (ONE TOUCH  ULTRA 2) w/Device KIT Test blood glucose once daily. 1 kit 0   clopidogrel  (PLAVIX ) 75 MG tablet TAKE 1 TABLET(75 MG) BY MOUTH DAILY 30 tablet 6   co-enzyme Q-10 30 MG capsule Take 1 capsule (30 mg total) by mouth 3 (three) times daily. 30 capsule 3   fenofibrate  (TRICOR ) 145 MG tablet Take 1 tablet (145 mg total) by mouth daily. 90 tablet 1   folic acid  (FOLVITE ) 1 MG tablet Take 1 mg by mouth daily.     glucose blood (ONETOUCH ULTRA) test strip Use as instructed 100 each 12   metFORMIN  (GLUCOPHAGE -XR) 500 MG 24 hr tablet Take 1 tablet (500 mg total) by mouth 2 (two) times daily with a meal. 180 tablet 1   methotrexate  (RHEUMATREX) 2.5 MG tablet Take 15 mg by mouth once a week.     omeprazole  (PRILOSEC) 20 MG capsule TAKE 1 CAPSULE(20 MG) BY MOUTH DAILY 90 capsule 3   OneTouch Delica Lancets 33G MISC Use as directed 100 each 3   rosuvastatin  (CRESTOR ) 5 MG tablet Take 1 tablet (5 mg total) by mouth daily. 90 tablet 3   sertraline  (ZOLOFT ) 100 MG tablet TAKE 1 TABLET(100 MG) BY MOUTH DAILY 90 tablet 1   Vitamin D , Ergocalciferol , (DRISDOL ) 1.25 MG (50000 UNIT) CAPS capsule Take 1 capsule (50,000 Units total) by mouth every 7 (seven) days. 12 capsule 0   No current facility-administered medications on file prior to visit.    There are  no Patient Instructions on file for this visit. No follow-ups on file.   Annamaria Barrette, NP

## 2024-03-12 NOTE — Progress Notes (Deleted)
 Referring Physician:  Mazie Speed, MD [address] [place],  [place] [address]  Primary Physician:  Mazie Speed, MD  History of Present Illness: 03/12/2024 [name] has a history of DM, hyperlipidemia, anxiety, RA, OSA, and breast CA.   Has seen rheumatology for RA.   Last seem by me on 01/22/24. She had last MRI on 02/03/23 and has known disc bulge at L4-L5 that may be contacting L5 nerve. Disc bulge L5-S1 as well as facet hypertrophy L4-S1. No spinal stenosis noted.   PT discussed at last visit and declined. She was to follow up with pain management to discuss injections. Dr. Rhesa Celeste wanted her to see vascular prior to proceeding with any injections.   She saw vascular on 03/12/24 and is being scheduled for angiogram of right lower extremity.   She is here for follow up.         She was doing better after she switched jobs. She was lost to follow up.   She has intermittent LBP with intermittent bilateral leg pain- legs feel heavy and like they weigh 500 lbs. Legs feel like they will give way with walking and feel shaky. She only has back and leg pain with walking or standing. Pain improves once she stops to sit.   She has a carotid stent in her neck in October.   Bowel/Bladder Dysfunction: none  Conservative measures:  Physical therapy: has not participated  Multimodal medical therapy including regular antiinflammatories: prednisone , ibuprofen Injections: has not received epidural steroid injections  Past Surgery: no prior spine surgery  SERAI HOYLAND has no symptoms of cervical myelopathy.  The symptoms are causing a significant impact on the patient's life.   Review of Systems:  A 10 point review of systems is negative, except for the pertinent positives and negatives detailed in the HPI.  Past Medical History: Past Medical History:  Diagnosis Date   Cancer (HCC) 11/14/2007   Left breast wide excision, sentinel node  biopsy. 1.4 cm, T1c, N0: ER 90%; PR 905; Her 2 neu not over expressed.    Diabetes mellitus without complication (HCC)    GERD (gastroesophageal reflux disease)    Hyperlipidemia    Hypertension    Vitamin D  deficiency     Past Surgical History: Past Surgical History:  Procedure Laterality Date   ABDOMINAL HYSTERECTOMY  2004   total, due to fibriods. Also had ovaries removed after 1 year of breast cancer.    BREAST LUMPECTOMY Left 2010   CAROTID PTA/STENT INTERVENTION Right 08/04/2023   Procedure: CAROTID PTA/STENT INTERVENTION;  Surgeon: Celso College, MD;  Location: [facility] INVASIVE CV LAB;  Service: Cardiovascular;  Laterality: Right;   COLONOSCOPY     25 yrs ago   COLONOSCOPY  04/16/2010   Dr Janine Melbourne   Kidney malfunction  Left    Nonfunctional : twisted during 2nd hysterectomy    Allergies: Allergies as of 03/19/2024   (No Known Allergies)    Medications: Outpatient Encounter Medications as of 03/19/2024  Medication Sig   aspirin  81 MG chewable tablet Chew by mouth daily.   Blood Glucose Monitoring Suppl (ONE TOUCH ULTRA 2) w/Device KIT Test blood glucose once daily.   clopidogrel  (PLAVIX ) 75 MG tablet TAKE 1 TABLET(75 MG) BY MOUTH DAILY   co-enzyme Q-10 30 MG capsule Take 1 capsule (30 mg total) by mouth 3 (three) times daily.   fenofibrate  (TRICOR ) 145 MG tablet Take 1 tablet (145 mg total) by mouth daily.  folic acid  (FOLVITE ) 1 MG tablet Take 1 mg by mouth daily.   glucose blood (ONETOUCH ULTRA) test strip Use as instructed   metFORMIN  (GLUCOPHAGE -XR) 500 MG 24 hr tablet Take 1 tablet (500 mg total) by mouth 2 (two) times daily with a meal.   methotrexate  (RHEUMATREX) 2.5 MG tablet Take 15 mg by mouth once a week.   omeprazole  (PRILOSEC) 20 MG capsule TAKE 1 CAPSULE(20 MG) BY MOUTH DAILY   OneTouch Delica Lancets 33G MISC Use as directed   rosuvastatin  (CRESTOR ) 5 MG tablet Take 1 tablet (5 mg total) by mouth daily.   sertraline  (ZOLOFT ) 100 MG tablet TAKE 1 TABLET(100 MG) BY  MOUTH DAILY   Vitamin D , Ergocalciferol , (DRISDOL ) 1.25 MG (50000 UNIT) CAPS capsule Take 1 capsule (50,000 Units total) by mouth every 7 (seven) days.   No facility-administered encounter medications on file as of 03/19/2024.    Social History: Social History   Tobacco Use   Smoking status: Every Day    Current packs/day: 1.00    Types: Cigarettes   Smokeless tobacco: Never  Vaping Use   Vaping status: Never Used  Substance Use Topics   Alcohol use: No   Drug use: No    Family Medical History: Family History  Problem Relation Age of Onset   Stroke Mother    Diabetes Mother    Heart disease Father    Heart disease Sister     Physical Examination: There were no vitals filed for this visit.     Awake, alert, oriented to person, place, and time.  Speech is clear and fluent. Fund of knowledge is appropriate.   Cranial Nerves: Pupils equal round and reactive to light.  Facial tone is symmetric.    No abnormal lesions on exposed skin.   Strength:  Side Iliopsoas Quads Hamstring PF DF EHL  R 5 5 5 5 5 5   L 5 5 5 5 5 5    Reflexes are 2+ and symmetric at the patella and achilles.    Clonus is not present.   Bilateral lower extremity sensation is intact to light touch.     No pain with IR/ER of both hips.   Gait is normal.    Medical Decision Making  Imaging: none   Assessment and Plan: Ms. Ballen has intermittent LBP with intermittent bilateral leg pain- legs feel heavy and like they weigh 500 lbs. Legs feel like they will give way with walking and feel shaky. She only has back and leg pain with walking or standing. Pain improves once she stops to sit.   MRI from last year showed disc bulge at L4-L5 that may be contacting L5 nerve. Disc bulge L5-S1 as well as facet hypertrophy L4-S1. No spinal stenosis noted, but her symptoms are consistent with this.    Treatment options discussed with patient and following plan made:    - Discussed PT for lumbar spine. She  declines.  - Referral to pain management (Lateef) to discuss lumbar injections.  - May consider updated lumbar MRI if no improvement with above.  - Follow up with me in 6-8 weeks and prn.   I spent a total of 20 minutes in face-to-face and non-face-to-face activities related to this patient's care today including review of outside records, review of imaging, review of symptoms, physical exam, discussion of differential diagnosis, discussion of treatment options, and documentation.   Lucetta Russel PA-C Dept. of Neurosurgery

## 2024-03-12 NOTE — H&P (View-Only) (Signed)
 Subjective:    Patient ID: Cassandra Butler, female    DOB: 11-05-1966, 58 y.o.   MRN: [id number] Chief Complaint  Patient presents with   Follow-up    F/u 3 months Carotid  JD    Cassandra Butler is a 58 year old female who presents to clinic today for follow-up of her carotid stenosis as well as bilateral lower extremity pain and weakness.  Patient denies any neurological symptoms today.  She denies any recent TIAs, CVAs or hemiplegic weakness.  She denies any recent dizziness or blurred vision.  However she does endorse that her bilateral lower extremities feel weaker and she is having pain at rest as well as on ambulation.  She states she is having difficulty walking longer distances as prior.  On her last visit there was a question of this being partly neurogenic.  Patient endorses she has seen neurosurgery at this time they do not wish to repeat MRI of her spine.  She states she been told it is too early to repeat the MRI based on her current symptoms.    Review of Systems  Constitutional: Negative.   HENT: Negative.    Respiratory: Negative.    Cardiovascular: Negative.   Gastrointestinal: Negative.   Genitourinary: Negative.   Musculoskeletal:  Positive for back pain and gait problem.  Skin: Negative.   Neurological:  Positive for weakness and numbness.       Patient endorses bilateral leg weakness with some intermittent numbness and tingling  Psychiatric/Behavioral: Negative.    All other systems reviewed and are negative.      Objective:   Physical Exam Vitals reviewed.  Constitutional:      Appearance: Normal appearance.  HENT:     Head: Normocephalic.  Eyes:     Pupils: Pupils are equal, round, and reactive to light.  Cardiovascular:     Rate and Rhythm: Normal rate and regular rhythm.     Pulses: Normal pulses.     Heart sounds: Normal heart sounds.  Pulmonary:     Effort: Pulmonary effort is normal.     Breath sounds: Normal breath sounds.  Abdominal:      General: Abdomen is flat. Bowel sounds are normal.     Palpations: Abdomen is soft.  Musculoskeletal:        General: Normal range of motion.  Skin:    General: Skin is warm and dry.  Neurological:     General: No focal deficit present.     Mental Status: She is alert and oriented to person, place, and time.  Psychiatric:        Mood and Affect: Mood normal.        Behavior: Behavior normal.        Thought Content: Thought content normal.        Judgment: Judgment normal.     BP 134/82   Pulse 90   Resp 18   Ht 5\' 6"  (1.676 m)   Wt 166 lb 12.8 oz (75.7 kg)   BMI 26.92 kg/m   Past Medical History:  Diagnosis Date   Cancer (HCC) 11/14/2007   Left breast wide excision, sentinel node biopsy. 1.4 cm, T1c, N0: ER 90%; PR 905; Her 2 neu not over expressed.    Diabetes mellitus without complication (HCC)    GERD (gastroesophageal reflux disease)    Hyperlipidemia    Hypertension    Vitamin D  deficiency     Social History   Socioeconomic History   Marital status: Married  Spouse name: Blaise Bumps   Number of children: 2   Years of education: Not on file   Highest education level: Not on file  Occupational History   Not on file  Tobacco Use   Smoking status: Every Day    Current packs/day: 1.00    Types: Cigarettes   Smokeless tobacco: Never  Vaping Use   Vaping status: Never Used  Substance and Sexual Activity   Alcohol use: No   Drug use: No   Sexual activity: Not on file  Other Topics Concern   Not on file  Social History Narrative   Lives at home with spouse: 2 children (adults) live locally    Social Drivers of Corporate investment banker Strain: Not on file  Food Insecurity: Not on file  Transportation Needs: Not on file  Physical Activity: Not on file  Stress: Not on file  Social Connections: Not on file  Intimate Partner Violence: Not on file    Past Surgical History:  Procedure Laterality Date   ABDOMINAL HYSTERECTOMY  2004   total, due to  fibriods. Also had ovaries removed after 1 year of breast cancer.    BREAST LUMPECTOMY Left 2010   CAROTID PTA/STENT INTERVENTION Right 08/04/2023   Procedure: CAROTID PTA/STENT INTERVENTION;  Surgeon: Celso College, MD;  Location: [facility] INVASIVE CV LAB;  Service: Cardiovascular;  Laterality: Right;   COLONOSCOPY     25 yrs ago   COLONOSCOPY  04/16/2010   Dr Janine Melbourne   Kidney malfunction  Left    Nonfunctional : twisted during 2nd hysterectomy    Family History  Problem Relation Age of Onset   Stroke Mother    Diabetes Mother    Heart disease Father    Heart disease Sister     No Known Allergies     Latest Ref Rng & Units 01/16/2024    9:45 AM 08/05/2023    3:48 AM 07/03/2023    9:55 AM  CBC  WBC 3.4 - 10.8 x10E3/uL 3.6  3.4  4.9   Hemoglobin 11.1 - 15.9 g/dL 16.1  09.6  04.5   Hematocrit 34.0 - 46.6 % 39.7  30.8  39.9   Platelets 150 - 450 x10E3/uL 88  72  95       CMP     Component Value Date/Time   NA 132 (L) 01/16/2024 0945   NA 136 02/17/2015 1423   K 4.7 01/16/2024 0945   K 4.1 02/17/2015 1423   CL 93 (L) 01/16/2024 0945   CL 102 02/17/2015 1423   CO2 22 01/16/2024 0945   CO2 28 02/17/2015 1423   GLUCOSE 393 (H) 01/16/2024 0945   GLUCOSE 207 (H) 08/05/2023 0348   GLUCOSE 139 (H) 02/17/2015 1423   BUN 17 01/16/2024 0945   BUN 12 02/17/2015 1423   CREATININE 0.97 01/16/2024 0945   CREATININE 0.88 02/17/2015 1423   CALCIUM  9.8 01/16/2024 0945   CALCIUM  9.3 02/17/2015 1423   PROT 6.9 01/16/2024 0945   PROT 7.4 02/17/2015 1423   ALBUMIN 4.5 01/16/2024 0945   ALBUMIN 4.2 02/17/2015 1423   AST 23 01/16/2024 0945   AST 29 02/17/2015 1423   ALT 23 01/16/2024 0945   ALT 30 02/17/2015 1423   ALKPHOS 102 01/16/2024 0945   ALKPHOS 81 02/17/2015 1423   BILITOT 0.7 01/16/2024 0945   BILITOT 0.4 02/17/2015 1423   EGFR 69 01/16/2024 0945   GFRNONAA 58 (L) 08/05/2023 0348   GFRNONAA >60 02/17/2015 1423  No results found.     Assessment & Plan:   1.  Claudication [facility]) (Primary) Patient returns to clinic today with complaints of increasing pain and weakness to her bilateral lower extremities.  She endorses that the right is worse than the left.  She is unable to walk more than 200 feet at this time without having severe pain.  She also endorses resting pain most notably at night to both of her extremities and again right greater than left.  She has no noted open sores or wounds at this time.  She endorses she has gone back to seeing neurosurgery who does not wish to MRI her back again for any neurological issues at this time.  Therefore I recommend due to her increasing ambulatory and rest pain to bilateral lower extremities right greater than left an angiogram of her right lower extremity with possible intervention.  We discussed today in detail the procedure, benefits, risk, complications.  I also discussed the risk of acute kidney injury due to the use of IV contrast dye as she only has 1 functioning kidney..  She verbalizes her understanding and wishes to proceed.  Will schedule her as outpatient accordingly.  2. Type 2 diabetes mellitus with hyperglycemia, without long-term current use of insulin  (HCC) Continue hypoglycemic medications as already ordered, these medications have been reviewed and there are no changes at this time.  Hgb A1C to be monitored as already arranged by primary service  3. Hypertension associated with diabetes (HCC) Continue antihypertensive medications as already ordered, these medications have been reviewed and there are no changes at this time.  4. Carotid stenosis, right The patient recently had a carotid duplex. We are closely following this and she will return in 3 months. We will follow-up the status of her legs at that time   Current Outpatient Medications on File Prior to Visit  Medication Sig Dispense Refill   aspirin  81 MG chewable tablet Chew by mouth daily.     Blood Glucose Monitoring Suppl (ONE TOUCH  ULTRA 2) w/Device KIT Test blood glucose once daily. 1 kit 0   clopidogrel  (PLAVIX ) 75 MG tablet TAKE 1 TABLET(75 MG) BY MOUTH DAILY 30 tablet 6   co-enzyme Q-10 30 MG capsule Take 1 capsule (30 mg total) by mouth 3 (three) times daily. 30 capsule 3   fenofibrate  (TRICOR ) 145 MG tablet Take 1 tablet (145 mg total) by mouth daily. 90 tablet 1   folic acid  (FOLVITE ) 1 MG tablet Take 1 mg by mouth daily.     glucose blood (ONETOUCH ULTRA) test strip Use as instructed 100 each 12   metFORMIN  (GLUCOPHAGE -XR) 500 MG 24 hr tablet Take 1 tablet (500 mg total) by mouth 2 (two) times daily with a meal. 180 tablet 1   methotrexate  (RHEUMATREX) 2.5 MG tablet Take 15 mg by mouth once a week.     omeprazole  (PRILOSEC) 20 MG capsule TAKE 1 CAPSULE(20 MG) BY MOUTH DAILY 90 capsule 3   OneTouch Delica Lancets 33G MISC Use as directed 100 each 3   rosuvastatin  (CRESTOR ) 5 MG tablet Take 1 tablet (5 mg total) by mouth daily. 90 tablet 3   sertraline  (ZOLOFT ) 100 MG tablet TAKE 1 TABLET(100 MG) BY MOUTH DAILY 90 tablet 1   Vitamin D , Ergocalciferol , (DRISDOL ) 1.25 MG (50000 UNIT) CAPS capsule Take 1 capsule (50,000 Units total) by mouth every 7 (seven) days. 12 capsule 0   No current facility-administered medications on file prior to visit.    There are  no Patient Instructions on file for this visit. No follow-ups on file.   Annamaria Barrette, NP

## 2024-03-18 ENCOUNTER — Telehealth (INDEPENDENT_AMBULATORY_CARE_PROVIDER_SITE_OTHER): Payer: Self-pay | Admitting: Vascular Surgery

## 2024-03-18 NOTE — Telephone Encounter (Signed)
 Patient called checking of status of scheduling angio.

## 2024-03-19 ENCOUNTER — Ambulatory Visit: Payer: PRIVATE HEALTH INSURANCE | Admitting: Orthopedic Surgery

## 2024-03-19 ENCOUNTER — Telehealth (INDEPENDENT_AMBULATORY_CARE_PROVIDER_SITE_OTHER): Payer: Self-pay

## 2024-03-19 NOTE — Telephone Encounter (Signed)
 Spoke with the patient and she is scheduled for a RLE angio with Dr. Vonna Guardian on 04/05/24 with a 6:45 am arrival time to the [facility]. Pre-procedure instructions were discussed and will be sent to Mychart and mailed.

## 2024-03-22 NOTE — Telephone Encounter (Signed)
 I spoke with the patient on 03/19/24 and she was rescheduled to 03/29/24 with a 11:00 am arrival to the [facility]. This was mailed and sent to Mychart.

## 2024-03-29 ENCOUNTER — Encounter: Payer: Self-pay | Admitting: Vascular Surgery

## 2024-03-29 ENCOUNTER — Ambulatory Visit
Admission: RE | Admit: 2024-03-29 | Discharge: 2024-03-29 | Disposition: A | Discharge status: Home / Self Care | Payer: PRIVATE HEALTH INSURANCE | Attending: Vascular Surgery | Admitting: Vascular Surgery

## 2024-03-29 ENCOUNTER — Other Ambulatory Visit: Payer: Self-pay

## 2024-03-29 ENCOUNTER — Encounter
Admission: RE | Disposition: A | Discharge status: Home / Self Care | Payer: Self-pay | Source: Home / Self Care | Attending: Vascular Surgery

## 2024-03-29 DIAGNOSIS — I6521 Occlusion and stenosis of right carotid artery: Secondary | ICD-10-CM | POA: Insufficient documentation

## 2024-03-29 DIAGNOSIS — E1165 Type 2 diabetes mellitus with hyperglycemia: Secondary | ICD-10-CM | POA: Diagnosis not present

## 2024-03-29 DIAGNOSIS — F17210 Nicotine dependence, cigarettes, uncomplicated: Secondary | ICD-10-CM | POA: Diagnosis not present

## 2024-03-29 DIAGNOSIS — I70213 Atherosclerosis of native arteries of extremities with intermittent claudication, bilateral legs: Secondary | ICD-10-CM | POA: Diagnosis present

## 2024-03-29 DIAGNOSIS — Z7984 Long term (current) use of oral hypoglycemic drugs: Secondary | ICD-10-CM | POA: Insufficient documentation

## 2024-03-29 DIAGNOSIS — I70229 Atherosclerosis of native arteries of extremities with rest pain, unspecified extremity: Secondary | ICD-10-CM

## 2024-03-29 DIAGNOSIS — I152 Hypertension secondary to endocrine disorders: Secondary | ICD-10-CM | POA: Diagnosis not present

## 2024-03-29 DIAGNOSIS — I700 Atherosclerosis of aorta: Secondary | ICD-10-CM | POA: Diagnosis not present

## 2024-03-29 HISTORY — PX: LOWER EXTREMITY ANGIOGRAPHY: CATH118251

## 2024-03-29 LAB — GLUCOSE, CAPILLARY
Glucose-Capillary: 324 mg/dL — ABNORMAL HIGH (ref 70–99)
Glucose-Capillary: 274 mg/dL — ABNORMAL HIGH (ref 70–99)

## 2024-03-29 LAB — CREATININE, SERUM
Creatinine, Ser: 0.91 mg/dL (ref 0.44–1.00)
GFR, Estimated: >60 mL/min (ref >60)

## 2024-03-29 LAB — BUN: BUN: 21 mg/dL — ABNORMAL HIGH (ref 6–20)

## 2024-03-29 SURGERY — LOWER EXTREMITY ANGIOGRAPHY
Anesthesia: Moderate Sedation | Site: Leg Lower | Laterality: Right

## 2024-03-29 MED ORDER — SODIUM CHLORIDE 0.9% FLUSH: 3.0000 mL | INTRAVENOUS | Status: DC | PRN

## 2024-03-29 MED ORDER — MIDAZOLAM HCL 2 MG/2ML IJ SOLN
INTRAMUSCULAR | Status: AC
Start: 2024-03-29 — End: ?
  Filled 2024-03-29: qty 4

## 2024-03-29 MED ORDER — FENTANYL CITRATE (PF) 100 MCG/2ML IJ SOLN
INTRAMUSCULAR | Status: DC | PRN
  Administered 2024-03-29: 50 ug via INTRAVENOUS
  Administered 2024-03-29 (×3): 25 ug via INTRAVENOUS

## 2024-03-29 MED ORDER — HYDRALAZINE HCL 20 MG/ML IJ SOLN: 5.0000 mg | INTRAMUSCULAR | Status: DC | PRN

## 2024-03-29 MED ORDER — LIDOCAINE-EPINEPHRINE (PF) 1 %-1:200000 IJ SOLN
INTRAMUSCULAR | Status: DC | PRN
  Administered 2024-03-29 (×2): 10 mL

## 2024-03-29 MED ORDER — DIPHENHYDRAMINE HCL 50 MG/ML IJ SOLN
50.0000 mg | Freq: Once | INTRAMUSCULAR | Status: DC | PRN
Start: 2024-03-29 — End: 2024-03-29

## 2024-03-29 MED ORDER — MIDAZOLAM HCL 2 MG/2ML IJ SOLN
INTRAMUSCULAR | Status: DC | PRN
  Administered 2024-03-29 (×2): 1 mg via INTRAVENOUS
  Administered 2024-03-29: 2 mg via INTRAVENOUS

## 2024-03-29 MED ORDER — METHYLPREDNISOLONE SODIUM SUCC 125 MG IJ SOLR: 125.0000 mg | Freq: Once | INTRAMUSCULAR | Status: DC | PRN

## 2024-03-29 MED ORDER — FENTANYL CITRATE (PF) 100 MCG/2ML IJ SOLN
INTRAMUSCULAR | Status: AC
  Filled 2024-03-29: qty 2

## 2024-03-29 MED ORDER — FENTANYL CITRATE PF 50 MCG/ML IJ SOSY
PREFILLED_SYRINGE | INTRAMUSCULAR | Status: AC
Start: 2024-03-29 — End: ?
  Filled 2024-03-29: qty 1

## 2024-03-29 MED ORDER — LABETALOL HCL 5 MG/ML IV SOLN: 10.0000 mg | INTRAVENOUS | Status: DC | PRN

## 2024-03-29 MED ORDER — HYDROMORPHONE HCL 1 MG/ML IJ SOLN: 1.0000 mg | Freq: Once | INTRAMUSCULAR | Status: DC | PRN

## 2024-03-29 MED ORDER — SODIUM CHLORIDE 0.9 % IV SOLN: INTRAVENOUS | Status: DC

## 2024-03-29 MED ORDER — ACETAMINOPHEN 325 MG PO TABS: 650.0000 mg | ORAL_TABLET | ORAL | Status: DC | PRN

## 2024-03-29 MED ORDER — FAMOTIDINE 20 MG PO TABS
40.0000 mg | ORAL_TABLET | Freq: Once | ORAL | Status: DC | PRN
Start: 2024-03-29 — End: 2024-03-29

## 2024-03-29 MED ORDER — MIDAZOLAM HCL 2 MG/2ML IJ SOLN
INTRAMUSCULAR | Status: AC
  Filled 2024-03-29: qty 2

## 2024-03-29 MED ORDER — SODIUM CHLORIDE 0.9% FLUSH: 3.0000 mL | Freq: Two times a day (BID) | INTRAVENOUS | Status: DC

## 2024-03-29 MED ORDER — CEFAZOLIN SODIUM-DEXTROSE 2-4 GM/100ML-% IV SOLN
2.0000 g | INTRAVENOUS | Status: AC
  Administered 2024-03-29: 2 g via INTRAVENOUS

## 2024-03-29 MED ORDER — IODIXANOL 320 MG/ML IV SOLN
INTRAVENOUS | Status: DC | PRN
  Administered 2024-03-29: 65 mL

## 2024-03-29 MED ORDER — ONDANSETRON HCL 4 MG/2ML IJ SOLN: 4.0000 mg | Freq: Four times a day (QID) | INTRAMUSCULAR | Status: DC | PRN

## 2024-03-29 MED ORDER — HEPARIN (PORCINE) IN NACL 1000-0.9 UT/500ML-% IV SOLN
INTRAVENOUS | Status: DC | PRN
  Administered 2024-03-29: 500 mL

## 2024-03-29 MED ORDER — MIDAZOLAM HCL 2 MG/ML PO SYRP: 8.0000 mg | ORAL_SOLUTION | Freq: Once | ORAL | Status: DC | PRN

## 2024-03-29 MED ORDER — HEPARIN SODIUM (PORCINE) 1000 UNIT/ML IJ SOLN
INTRAMUSCULAR | Status: AC
  Filled 2024-03-29: qty 10

## 2024-03-29 MED ORDER — CEFAZOLIN SODIUM-DEXTROSE 2-4 GM/100ML-% IV SOLN
INTRAVENOUS | Status: AC
Start: 2024-03-29 — End: ?
  Filled 2024-03-29: qty 100

## 2024-03-29 MED ORDER — HEPARIN SODIUM (PORCINE) 1000 UNIT/ML IJ SOLN
INTRAMUSCULAR | Status: DC | PRN
  Administered 2024-03-29: 5000 [IU] via INTRAVENOUS

## 2024-03-29 MED ORDER — SODIUM CHLORIDE 0.9 % IV SOLN: 250.0000 mL | INTRAVENOUS | Status: DC | PRN

## 2024-03-29 SURGICAL SUPPLY — 15 items
CATH BEACON 5 .035 40 KMP TP (CATHETERS) IMPLANT
CATH PIG 70CM (CATHETERS) IMPLANT
COVER PROBE ULTRASOUND 5X96 (MISCELLANEOUS) IMPLANT
DEVICE PRESTO INFLATION (MISCELLANEOUS) IMPLANT
DEVICE STARCLOSE SE CLOSURE (Vascular Products) IMPLANT
GLIDESHEATH SLENDER 7FR .021G (SHEATH) IMPLANT
GLIDEWIRE ADV .035X260CM (WIRE) IMPLANT
KIT MICROPUNCTURE VSI 5F STIFF (SHEATH) IMPLANT
PACK ANGIOGRAPHY (CUSTOM PROCEDURE TRAY) ×1 IMPLANT
SHEATH BRITE TIP 5FRX11 (SHEATH) IMPLANT
STENT LIFESTREAM 8X58X80 (Permanent Stent) IMPLANT
SYR MEDRAD MARK 7 150ML (SYRINGE) IMPLANT
TUBING CONTRAST HIGH PRESS 72 (TUBING) IMPLANT
WIRE J 3MM .035X145CM (WIRE) IMPLANT
WIRE SUPRACORE 190CM (WIRE) IMPLANT

## 2024-03-29 NOTE — Op Note (Signed)
 [facility]  Percutaneous Study/Intervention Procedural Note   Date of Surgery: 03/29/2024  Surgeon(s):Donnielle Addison    Assistants:none  Pre-operative Diagnosis: PAD with claudication bilateral lower extremities, right greater than left  Post-operative diagnosis:  Same  Procedure(s) Performed:             1.  Ultrasound guidance for vascular access bilateral femoral arteries             2.  Catheter placement into aorta from bilateral femoral approaches             3.  Aortogram and selective right lower extremity angiogram             4.  Balloon expandable stent placement to bilateral common iliac arteries with 8 mm diameter by 58 mm length Lifestream stents bilaterally             5.  StarClose closure device bilateral femoral arteries  EBL: 5 cc  Contrast: 65 cc  Fluoro Time: 2.9 minutes  Moderate Conscious Sedation Time: approximately 44 minutes using 4 mg of Versed  and 125 mcg of Fentanyl               Indications:  Patient is a 58 y.o.female with disabling claudication symptoms of both lower extremities, worse on the right. The patient is brought in for angiography for further evaluation and potential treatment. Risks and benefits are discussed and informed consent is obtained.   Procedure:  The patient was identified and appropriate procedural time out was performed.  The patient was then placed supine on the table and prepped and draped in the usual sterile fashion. Moderate conscious sedation was administered during a face to face encounter with the patient throughout the procedure with my supervision of the RN administering medicines and monitoring the patient's vital signs, pulse oximetry, telemetry and mental status throughout from the start of the procedure until the patient was taken to the recovery room. Ultrasound was used to evaluate the left common femoral artery.  It was patent .  A digital ultrasound image was acquired.  A Seldinger needle was  used to access the left common femoral artery under direct ultrasound guidance and a permanent image was performed.  A 0.035 J wire was advanced without resistance and a 5Fr sheath was placed.  Pigtail catheter was placed into the aorta and an AP aortogram was performed.  I then pulled down and did pelvic obliques with the catheter in the distal aorta.  This demonstrated normal renal arteries.  The aorta was highly calcific but not stenotic.  Left common iliac artery had moderate stenosis in the 50 to 60% range.  The right common iliac artery had a high-grade near occlusive stenosis in the 90 to 95% range.  This would clearly need kissing balloon expandable stents to treat these lesions appropriately.  I elected to go ahead and image the right lower extremity through the pigtail catheter in the aorta. This demonstrated fairly normal right common femoral artery, superficial femoral artery, and popliteal artery.  There was two-vessel runoff with the anterior tibial and peroneal arteries being continuous distally. It was felt that it was in the patient's best interest to proceed with intervention after these images to avoid a second procedure and a larger amount of contrast and fluoroscopy based off of the findings from the initial angiogram.  I then elected to access the right femoral artery with ultrasound.  This was done with a micropuncture needle.  A micropuncture wire and  sheath were placed and then we upsized to a 7 French slender sheath.  We then upsized to a 7 French slender sheath on the left as well.  The patient was systemically heparinized. I then used a Kumpe catheter and the advantage wire to navigate through the tight lesion on the right side get a catheter up into the aorta.  I then proceeded with treatment.  I selected 8 mm diameter by 58 mm length Lifestream stents on both sides.  These went up into the aorta about 3 to 4 mm.  The state above the hypogastric arteries on each side.  These were  inflated to 12 atm simultaneously in the common iliac arteries bilaterally.  Completion imaging showed marked improvement with only about a 10% residual stenosis on the right and no stenosis on the left. I elected to terminate the procedure. The sheath was removed and StarClose closure device was deployed in the right femoral artery with excellent hemostatic result.  Similarly, StarClose closure device was deployed on the left femoral artery with excellent hemostatic result.  The patient was taken to the recovery room in stable condition having tolerated the procedure well.  Findings:               Aortogram:  This demonstrated normal renal arteries.  The aorta was highly calcific but not stenotic.  Left common iliac artery had moderate stenosis in the 50 to 60% range.  The right common iliac artery had a high-grade near occlusive stenosis in the 90 to 95% range.              Right lower Extremity:  This demonstrated fairly normal right common femoral artery, superficial femoral artery, and popliteal artery.  There was two-vessel runoff with the anterior tibial and peroneal arteries being continuous distally.   Disposition: Patient was taken to the recovery room in stable condition having tolerated the procedure well.  Complications: None  Mikki Alexander 03/29/2024 1:16 PM   This note was created with Dragon Medical transcription system. Any errors in dictation are purely unintentional.

## 2024-03-29 NOTE — Discharge Instructions (Signed)

## 2024-03-30 ENCOUNTER — Ambulatory Visit: Payer: Self-pay

## 2024-03-30 ENCOUNTER — Encounter: Payer: Self-pay | Admitting: Vascular Surgery

## 2024-03-30 ENCOUNTER — Encounter (INDEPENDENT_AMBULATORY_CARE_PROVIDER_SITE_OTHER): Payer: Self-pay

## 2024-03-30 ENCOUNTER — Other Ambulatory Visit: Payer: Self-pay

## 2024-03-30 MED ORDER — CEPHALEXIN 500 MG PO CAPS: 500.0000 mg | ORAL_CAPSULE | Freq: Three times a day (TID) | ORAL | 0 refills | Status: DC

## 2024-03-30 NOTE — Telephone Encounter (Signed)
 We do not typically provide antibiotics without evaluating the patient and checking urine sample. However, since she just had that procedure, will go ahead. If not improving with abx, needs appt to assess.  Ok to send in Keflex  500mg  TID x5d #15 r0

## 2024-03-30 NOTE — Telephone Encounter (Signed)
 Copied from CRM [id number]. Topic: Clinical - Medical Advice >> Mar 30, 2024  9:13 AM Cassandra Butler wrote: Reason for CRM: patient stated thinks she has a bladder or possible kidney infection as she has been having frequent bathroom visits and not completely emptying out her bladder. She also has lower back pain. Please f/u with patient   Chief Complaint: Increased urinary frequency  Symptoms: Increased urinary frequency, decreased urinary output Pertinent Negatives: Patient denies pain with urination  Disposition: [] ED /[] Urgent Care (no appt availability in office) / [x] Appointment(In office/virtual)/ []  [facility]/ [] Home Care/ [] Refused Recommended Disposition /[x] [facility]/ []  Follow-up with PCP Additional Notes: Patient reports she has been experiencing increased urinary frequency and decreased urinary output for the last 4 days. She denies any pain with urination. She believes she has a UTI and wanted to know if Dr. David Escort would prescribe her an antibiotic. Patient states she had a lower extremity angiography done yesterday and was instructed not to drive and states she would not be able to come in for an appointment. Please contact the patient with a response to her request for an antibiotic when able.    Reason for Disposition  Urinating more frequently than usual (i.e., frequency)  Answer Assessment - Initial Assessment Questions 1. SYMPTOM: "What's the main symptom you're concerned about?" (e.g., frequency, incontinence)     Increased urinary frequency  2. ONSET: "When did the increased urinary frequency start?"     4 days ago  3. PAIN: "Is there any pain?" If Yes, ask: "How bad is it?" (Scale: 1-10; mild, moderate, severe)     No 4. CAUSE: "What do you think is causing the symptoms?"     Believes she may have a UTI 5. OTHER SYMPTOMS: "Do you have any other symptoms?" (e.g., blood in urine, fever, flank pain, pain with urination)     Decreased urinary  output  Protocols used: Urinary Symptoms-A-AH

## 2024-04-08 NOTE — Interval H&P Note (Signed)
 History and Physical Interval Note:  04/08/2024 10:50 AM  Cassandra Butler  has presented today for surgery, with the diagnosis of RLE Angio   ASO w rest pain.  The various methods of treatment have been discussed with the patient and family. After consideration of risks, benefits and other options for treatment, the patient has consented to  Procedure(s): Lower Extremity Angiography (Right) as a surgical intervention.  The patient's history has been reviewed, patient examined, no change in status, stable for surgery.  I have reviewed the patient's chart and labs.  Questions were answered to the patient's satisfaction.     Zell Hylton

## 2024-04-16 ENCOUNTER — Other Ambulatory Visit: Payer: Self-pay | Admitting: Family Medicine

## 2024-04-16 ENCOUNTER — Ambulatory Visit: Payer: PRIVATE HEALTH INSURANCE | Admitting: Family Medicine

## 2024-04-16 DIAGNOSIS — E559 Vitamin D deficiency, unspecified: Secondary | ICD-10-CM

## 2024-04-16 NOTE — Telephone Encounter (Signed)
 Requested medications are due for refill today.  yes  Requested medications are on the active medications list.  yes  Last refill. 01/19/2024 #12 0 rf  Future visit scheduled.   no  Notes to clinic.  Provider to review at this dosage.    Requested Prescriptions  Pending Prescriptions Disp Refills   Vitamin D , Ergocalciferol , (DRISDOL ) 1.25 MG (50000 UNIT) CAPS capsule [Pharmacy Med Name: VITAMIN D2 50,000IU (ERGO) CAP RX] 12 capsule 0    Sig: TAKE 1 CAPSULE BY MOUTH EVERY 7 DAYS     Endocrinology:  Vitamins - Vitamin D  Supplementation 2 Failed - 04/16/2024  3:28 PM      Failed - Manual Review: Route requests for 50,000 IU strength to the provider      Failed - Vitamin D  in normal range and within 360 days    Vit D, 25-Hydroxy  Date Value Ref Range Status  01/16/2024 18.7 (L) 30.0 - 100.0 ng/mL Final    Comment:    Vitamin D  deficiency has been defined by the Institute of Medicine and an Endocrine Society practice guideline as a level of serum 25-OH vitamin D  less than 20 ng/mL (1,2). The Endocrine Society went on to further define vitamin D  insufficiency as a level between 21 and 29 ng/mL (2). 1. IOM (Institute of Medicine). 2010. Dietary reference    intakes for calcium  and D. [place]: The    [employer]. 2. Holick MF, Binkley [place], Bischoff-Ferrari HA, et al.    Evaluation, treatment, and prevention of vitamin D     deficiency: an Endocrine Society clinical practice    guideline. JCEM. 2011 Jul; 96(7):1911-30.          Passed - Ca in normal range and within 360 days    Calcium   Date Value Ref Range Status  01/16/2024 9.8 8.7 - 10.2 mg/dL Final   Calcium , Total  Date Value Ref Range Status  02/17/2015 9.3 mg/dL Final    Comment:    0.8-65.7 NOTE: New Reference Range  01/17/15          Passed - Valid encounter within last 12 months    Recent Outpatient Visits           3 months ago Type 2 diabetes mellitus with hyperglycemia, without long-term  current use of insulin  [facility])   [facility] [place], Stan Eans, MD

## 2024-04-28 ENCOUNTER — Other Ambulatory Visit (INDEPENDENT_AMBULATORY_CARE_PROVIDER_SITE_OTHER): Payer: Self-pay | Admitting: Vascular Surgery

## 2024-04-28 DIAGNOSIS — Z9889 Other specified postprocedural states: Secondary | ICD-10-CM

## 2024-04-29 ENCOUNTER — Encounter (INDEPENDENT_AMBULATORY_CARE_PROVIDER_SITE_OTHER): Payer: PRIVATE HEALTH INSURANCE

## 2024-04-29 ENCOUNTER — Ambulatory Visit (INDEPENDENT_AMBULATORY_CARE_PROVIDER_SITE_OTHER): Payer: PRIVATE HEALTH INSURANCE | Admitting: Vascular Surgery

## 2024-07-13 ENCOUNTER — Other Ambulatory Visit: Payer: Self-pay | Admitting: Family Medicine

## 2024-07-13 DIAGNOSIS — F411 Generalized anxiety disorder: Secondary | ICD-10-CM

## 2024-07-14 ENCOUNTER — Other Ambulatory Visit: Payer: Self-pay | Admitting: Family Medicine

## 2024-07-14 DIAGNOSIS — E1165 Type 2 diabetes mellitus with hyperglycemia: Secondary | ICD-10-CM

## 2024-07-26 ENCOUNTER — Other Ambulatory Visit: Payer: Self-pay | Admitting: Family Medicine

## 2024-07-26 DIAGNOSIS — E559 Vitamin D deficiency, unspecified: Secondary | ICD-10-CM

## 2024-07-28 ENCOUNTER — Other Ambulatory Visit: Payer: Self-pay | Admitting: Family Medicine

## 2024-08-06 ENCOUNTER — Other Ambulatory Visit: Payer: Self-pay

## 2024-08-06 ENCOUNTER — Emergency Department: Payer: PRIVATE HEALTH INSURANCE

## 2024-08-06 ENCOUNTER — Inpatient Hospital Stay
Admission: EM | Admit: 2024-08-06 | Discharge: 2024-08-08 | DRG: 439 | Disposition: A | Discharge status: Home / Self Care | Payer: PRIVATE HEALTH INSURANCE | Attending: Internal Medicine | Admitting: Internal Medicine

## 2024-08-06 DIAGNOSIS — I10 Essential (primary) hypertension: Secondary | ICD-10-CM | POA: Diagnosis present

## 2024-08-06 DIAGNOSIS — R748 Abnormal levels of other serum enzymes: Secondary | ICD-10-CM | POA: Diagnosis present

## 2024-08-06 DIAGNOSIS — R161 Splenomegaly, not elsewhere classified: Secondary | ICD-10-CM | POA: Diagnosis present

## 2024-08-06 DIAGNOSIS — Z853 Personal history of malignant neoplasm of breast: Secondary | ICD-10-CM | POA: Diagnosis not present

## 2024-08-06 DIAGNOSIS — Z8249 Family history of ischemic heart disease and other diseases of the circulatory system: Secondary | ICD-10-CM

## 2024-08-06 DIAGNOSIS — K921 Melena: Secondary | ICD-10-CM | POA: Diagnosis present

## 2024-08-06 DIAGNOSIS — K858 Other acute pancreatitis without necrosis or infection: Secondary | ICD-10-CM

## 2024-08-06 DIAGNOSIS — K449 Diaphragmatic hernia without obstruction or gangrene: Secondary | ICD-10-CM | POA: Diagnosis present

## 2024-08-06 DIAGNOSIS — F17210 Nicotine dependence, cigarettes, uncomplicated: Secondary | ICD-10-CM | POA: Diagnosis present

## 2024-08-06 DIAGNOSIS — N1831 Chronic kidney disease, stage 3a: Secondary | ICD-10-CM

## 2024-08-06 DIAGNOSIS — Z833 Family history of diabetes mellitus: Secondary | ICD-10-CM | POA: Diagnosis not present

## 2024-08-06 DIAGNOSIS — E781 Pure hyperglyceridemia: Secondary | ICD-10-CM | POA: Diagnosis present

## 2024-08-06 DIAGNOSIS — N136 Pyonephrosis: Secondary | ICD-10-CM | POA: Diagnosis present

## 2024-08-06 DIAGNOSIS — Z794 Long term (current) use of insulin: Secondary | ICD-10-CM | POA: Diagnosis not present

## 2024-08-06 DIAGNOSIS — Z7982 Long term (current) use of aspirin: Secondary | ICD-10-CM

## 2024-08-06 DIAGNOSIS — N201 Calculus of ureter: Secondary | ICD-10-CM | POA: Diagnosis present

## 2024-08-06 DIAGNOSIS — F411 Generalized anxiety disorder: Secondary | ICD-10-CM | POA: Diagnosis present

## 2024-08-06 DIAGNOSIS — R000 Tachycardia, unspecified: Secondary | ICD-10-CM | POA: Diagnosis present

## 2024-08-06 DIAGNOSIS — Z90710 Acquired absence of both cervix and uterus: Secondary | ICD-10-CM

## 2024-08-06 DIAGNOSIS — Z716 Tobacco abuse counseling: Secondary | ICD-10-CM | POA: Diagnosis not present

## 2024-08-06 DIAGNOSIS — Z7902 Long term (current) use of antithrombotics/antiplatelets: Secondary | ICD-10-CM

## 2024-08-06 DIAGNOSIS — Z823 Family history of stroke: Secondary | ICD-10-CM | POA: Diagnosis not present

## 2024-08-06 DIAGNOSIS — Z7984 Long term (current) use of oral hypoglycemic drugs: Secondary | ICD-10-CM

## 2024-08-06 DIAGNOSIS — D61818 Other pancytopenia: Secondary | ICD-10-CM | POA: Diagnosis present

## 2024-08-06 DIAGNOSIS — Z79899 Other long term (current) drug therapy: Secondary | ICD-10-CM

## 2024-08-06 DIAGNOSIS — E1151 Type 2 diabetes mellitus with diabetic peripheral angiopathy without gangrene: Secondary | ICD-10-CM | POA: Diagnosis present

## 2024-08-06 DIAGNOSIS — K859 Acute pancreatitis without necrosis or infection, unspecified: Secondary | ICD-10-CM | POA: Diagnosis present

## 2024-08-06 DIAGNOSIS — M069 Rheumatoid arthritis, unspecified: Secondary | ICD-10-CM | POA: Diagnosis present

## 2024-08-06 DIAGNOSIS — Z720 Tobacco use: Secondary | ICD-10-CM | POA: Diagnosis present

## 2024-08-06 DIAGNOSIS — E1165 Type 2 diabetes mellitus with hyperglycemia: Secondary | ICD-10-CM | POA: Diagnosis present

## 2024-08-06 DIAGNOSIS — N261 Atrophy of kidney (terminal): Secondary | ICD-10-CM | POA: Diagnosis present

## 2024-08-06 DIAGNOSIS — N390 Urinary tract infection, site not specified: Secondary | ICD-10-CM

## 2024-08-06 LAB — COMPREHENSIVE METABOLIC PANEL WITH GFR
ALT: 19 U/L (ref 0–44)
AST: 24 U/L (ref 15–41)
Albumin: 3.7 g/dL (ref 3.5–5.0)
Alkaline Phosphatase: 74 U/L (ref 38–126)
Anion gap: 10 (ref 5–15)
BUN: 13 mg/dL (ref 6–20)
CO2: 27 mmol/L (ref 22–32)
Calcium: 9 mg/dL (ref 8.9–10.3)
Chloride: 98 mmol/L (ref 98–111)
Creatinine, Ser: 0.96 mg/dL (ref 0.44–1.00)
GFR, Estimated: >60 mL/min (ref >60)
Glucose, Bld: 384 mg/dL — ABNORMAL HIGH (ref 70–99)
Potassium: 4.9 mmol/L (ref 3.5–5.1)
Sodium: 135 mmol/L (ref 135–145)
Total Bilirubin: 0.6 mg/dL (ref 0.0–1.2)
Total Protein: 6.6 g/dL (ref 6.5–8.1)

## 2024-08-06 LAB — URINALYSIS, ROUTINE W REFLEX MICROSCOPIC
Bacteria, UA: NONE SEEN
Bilirubin Urine: NEGATIVE
Glucose, UA: >=500 mg/dL — AB
Hgb urine dipstick: NEGATIVE
Ketones, ur: NEGATIVE mg/dL
Nitrite: NEGATIVE
Protein, ur: 30 mg/dL — AB
Specific Gravity, Urine: 1.031 — ABNORMAL HIGH (ref 1.005–1.030)
pH: 6 (ref 5.0–8.0)

## 2024-08-06 LAB — LIPID PANEL
Cholesterol: 259 mg/dL — ABNORMAL HIGH (ref 0–200)
HDL: 28 mg/dL — ABNORMAL LOW (ref >40)
LDL Cholesterol: UNDETERMINED mg/dL (ref 0–99)
Total CHOL/HDL Ratio: 9.3 ratio
Triglycerides: 836 mg/dL — ABNORMAL HIGH (ref <150)
VLDL: UNDETERMINED mg/dL (ref 0–40)

## 2024-08-06 LAB — CBG MONITORING, ED: Glucose-Capillary: 200 mg/dL — ABNORMAL HIGH (ref 70–99)

## 2024-08-06 LAB — CBC
HCT: 34.5 % — ABNORMAL LOW (ref 36.0–46.0)
Hemoglobin: 11.5 g/dL — ABNORMAL LOW (ref 12.0–15.0)
MCH: 28.3 pg (ref 26.0–34.0)
MCHC: 33.3 g/dL (ref 30.0–36.0)
MCV: 85 fL (ref 80.0–100.0)
Platelets: 75 K/uL — ABNORMAL LOW (ref 150–400)
RBC: 4.06 MIL/uL (ref 3.87–5.11)
RDW: 15.2 % (ref 11.5–15.5)
WBC: 2.7 K/uL — ABNORMAL LOW (ref 4.0–10.5)
nRBC: 0 % (ref 0.0–0.2)

## 2024-08-06 LAB — GLUCOSE, CAPILLARY
Glucose-Capillary: 167 mg/dL — ABNORMAL HIGH (ref 70–99)
Glucose-Capillary: 145 mg/dL — ABNORMAL HIGH (ref 70–99)

## 2024-08-06 LAB — LIPASE, BLOOD: Lipase: 246 U/L — ABNORMAL HIGH (ref 11–51)

## 2024-08-06 MED ORDER — NICOTINE 21 MG/24HR TD PT24
21.0000 mg | MEDICATED_PATCH | Freq: Every day | TRANSDERMAL | Status: DC
  Filled 2024-08-06: qty 1

## 2024-08-06 MED ORDER — ONDANSETRON HCL 4 MG/2ML IJ SOLN
4.0000 mg | Freq: Once | INTRAMUSCULAR | Status: AC
  Administered 2024-08-06: 4 mg via INTRAVENOUS
  Filled 2024-08-06: qty 2

## 2024-08-06 MED ORDER — INSULIN ASPART 100 UNIT/ML IJ SOLN
0.0000 [IU] | INTRAMUSCULAR | Status: DC
  Administered 2024-08-06 – 2024-08-07 (×4): 3 [IU] via SUBCUTANEOUS
  Administered 2024-08-07 (×2): 5 [IU] via SUBCUTANEOUS
  Administered 2024-08-07: 2 [IU] via SUBCUTANEOUS
  Administered 2024-08-07: 8 [IU] via SUBCUTANEOUS
  Administered 2024-08-08 (×3): 3 [IU] via SUBCUTANEOUS
  Administered 2024-08-08: 11 [IU] via SUBCUTANEOUS
  Filled 2024-08-06 (×12): qty 1

## 2024-08-06 MED ORDER — SODIUM CHLORIDE 0.9 % IV BOLUS
1000.0000 mL | Freq: Once | INTRAVENOUS | Status: AC
  Administered 2024-08-06: 1000 mL via INTRAVENOUS

## 2024-08-06 MED ORDER — NICOTINE 14 MG/24HR TD PT24
14.0000 mg | MEDICATED_PATCH | Freq: Every day | TRANSDERMAL | Status: DC
  Administered 2024-08-06 – 2024-08-08 (×3): 14 mg via TRANSDERMAL
  Filled 2024-08-06 (×3): qty 1

## 2024-08-06 MED ORDER — ASPIRIN 81 MG PO CHEW
81.0000 mg | CHEWABLE_TABLET | Freq: Every day | ORAL | Status: DC
Start: 2024-08-07 — End: 2024-08-06

## 2024-08-06 MED ORDER — ACETAMINOPHEN 325 MG PO TABS
650.0000 mg | ORAL_TABLET | Freq: Four times a day (QID) | ORAL | Status: DC | PRN
  Administered 2024-08-06 – 2024-08-07 (×2): 650 mg via ORAL
  Filled 2024-08-06 (×2): qty 2

## 2024-08-06 MED ORDER — VITAMIN D (ERGOCALCIFEROL) 1.25 MG (50000 UNIT) PO CAPS: 50000.0000 [IU] | ORAL_CAPSULE | ORAL | Status: DC

## 2024-08-06 MED ORDER — FOLIC ACID 1 MG PO TABS
1.0000 mg | ORAL_TABLET | Freq: Every day | ORAL | Status: DC
  Administered 2024-08-06 – 2024-08-07 (×2): 1 mg via ORAL
  Filled 2024-08-06 (×2): qty 1

## 2024-08-06 MED ORDER — IOHEXOL 300 MG/ML  SOLN
100.0000 mL | Freq: Once | INTRAMUSCULAR | Status: AC | PRN
  Administered 2024-08-06: 100 mL via INTRAVENOUS

## 2024-08-06 MED ORDER — FENTANYL CITRATE PF 50 MCG/ML IJ SOSY
50.0000 ug | PREFILLED_SYRINGE | Freq: Once | INTRAMUSCULAR | Status: AC
  Administered 2024-08-06: 50 ug via INTRAVENOUS
  Filled 2024-08-06: qty 1

## 2024-08-06 MED ORDER — CEFTRIAXONE SODIUM 1 G IJ SOLR
1.0000 g | Freq: Once | INTRAMUSCULAR | Status: AC
  Administered 2024-08-06: 1 g via INTRAVENOUS
  Filled 2024-08-06: qty 10

## 2024-08-06 MED ORDER — ROSUVASTATIN CALCIUM 10 MG PO TABS
5.0000 mg | ORAL_TABLET | Freq: Every day | ORAL | Status: DC
  Administered 2024-08-06: 5 mg via ORAL
  Filled 2024-08-06: qty 1

## 2024-08-06 MED ORDER — FENOFIBRATE 160 MG PO TABS
160.0000 mg | ORAL_TABLET | Freq: Every day | ORAL | Status: DC
  Administered 2024-08-06 – 2024-08-07 (×2): 160 mg via ORAL
  Filled 2024-08-06 (×2): qty 1

## 2024-08-06 MED ORDER — FENOFIBRATE 160 MG PO TABS: 160.0000 mg | ORAL_TABLET | Freq: Every day | ORAL | Status: DC

## 2024-08-06 MED ORDER — MORPHINE SULFATE (PF) 2 MG/ML IV SOLN
2.0000 mg | INTRAVENOUS | Status: DC | PRN
  Administered 2024-08-06 (×2): 2 mg via INTRAVENOUS
  Filled 2024-08-06 (×2): qty 1

## 2024-08-06 MED ORDER — ENOXAPARIN SODIUM 40 MG/0.4ML IJ SOSY: 40.0000 mg | PREFILLED_SYRINGE | INTRAMUSCULAR | Status: DC

## 2024-08-06 MED ORDER — SODIUM CHLORIDE 0.9 % IV SOLN
INTRAVENOUS | Status: AC
Start: 2024-08-06 — End: 2024-08-07

## 2024-08-06 MED ORDER — ASPIRIN 81 MG PO CHEW
81.0000 mg | CHEWABLE_TABLET | Freq: Every day | ORAL | Status: DC
  Administered 2024-08-06 – 2024-08-07 (×2): 81 mg via ORAL
  Filled 2024-08-06 (×2): qty 1

## 2024-08-06 MED ORDER — MORPHINE SULFATE (PF) 2 MG/ML IV SOLN
2.0000 mg | Freq: Once | INTRAVENOUS | Status: AC
  Administered 2024-08-06: 2 mg via INTRAVENOUS
  Filled 2024-08-06: qty 1

## 2024-08-06 MED ORDER — PANTOPRAZOLE SODIUM 40 MG IV SOLR
40.0000 mg | INTRAVENOUS | Status: DC
  Administered 2024-08-06 – 2024-08-07 (×2): 40 mg via INTRAVENOUS
  Filled 2024-08-06 (×2): qty 10

## 2024-08-06 MED ORDER — SERTRALINE HCL 50 MG PO TABS
100.0000 mg | ORAL_TABLET | Freq: Every day | ORAL | Status: DC
Start: 2024-08-07 — End: 2024-08-06

## 2024-08-06 MED ORDER — CLOPIDOGREL BISULFATE 75 MG PO TABS
75.0000 mg | ORAL_TABLET | Freq: Every day | ORAL | Status: DC
Start: 2024-08-07 — End: 2024-08-06

## 2024-08-06 MED ORDER — FOLIC ACID 1 MG PO TABS
1.0000 mg | ORAL_TABLET | Freq: Every day | ORAL | Status: DC
Start: 2024-08-07 — End: 2024-08-06

## 2024-08-06 MED ORDER — ONDANSETRON HCL 4 MG PO TABS: 4.0000 mg | ORAL_TABLET | Freq: Four times a day (QID) | ORAL | Status: DC | PRN

## 2024-08-06 MED ORDER — SERTRALINE HCL 50 MG PO TABS
100.0000 mg | ORAL_TABLET | Freq: Every day | ORAL | Status: DC
Start: 2024-08-06 — End: 2024-08-08
  Administered 2024-08-06 – 2024-08-07 (×2): 100 mg via ORAL
  Filled 2024-08-06 (×2): qty 2

## 2024-08-06 MED ORDER — ROSUVASTATIN CALCIUM 10 MG PO TABS
5.0000 mg | ORAL_TABLET | Freq: Every day | ORAL | Status: DC
Start: 2024-08-07 — End: 2024-08-06

## 2024-08-06 MED ORDER — METHOTREXATE SODIUM 2.5 MG PO TABS: 15.0000 mg | ORAL_TABLET | ORAL | Status: DC

## 2024-08-06 MED ORDER — ONDANSETRON HCL 4 MG/2ML IJ SOLN
4.0000 mg | Freq: Four times a day (QID) | INTRAMUSCULAR | Status: DC | PRN
  Administered 2024-08-07: 4 mg via INTRAVENOUS
  Filled 2024-08-06: qty 2

## 2024-08-06 MED ORDER — SODIUM CHLORIDE 0.9 % IV SOLN: Freq: Once | INTRAVENOUS | Status: AC

## 2024-08-06 MED ORDER — CLOPIDOGREL BISULFATE 75 MG PO TABS
75.0000 mg | ORAL_TABLET | Freq: Every day | ORAL | Status: DC
  Administered 2024-08-06 – 2024-08-07 (×2): 75 mg via ORAL
  Filled 2024-08-06 (×2): qty 1

## 2024-08-06 MED ORDER — ACETAMINOPHEN 650 MG RE SUPP: 650.0000 mg | Freq: Four times a day (QID) | RECTAL | Status: DC | PRN

## 2024-08-06 NOTE — H&P (Signed)
 History and Physical    Patient: Cassandra Butler FMW:[id number] DOB: 03-Feb-1966 DOA: 08/06/2024 DOS: the patient was seen and examined on 08/06/2024 PCP: Myrla Jon HERO, MD  Patient coming from: Home  Chief Complaint:  Chief Complaint  Patient presents with   Abdominal Pain   HPI: Cassandra Butler is a 58 y.o. female with medical history significant for diabetes mellitus on oral agents, hypertension, dyslipidemia, GERD, peripheral arterial disease, history of rheumatoid arthritis, depression, status post right carotid stent who presents to the emergency room for evaluation of a 1 week history of abdominal pain. Pain is mostly in the epigastrium and periumbilical area and is rated a 6 x 10 in intensity at its worst.  It is nonradiating and is associated with nausea but no vomiting.  Pain is aggravated by meals.  She denies having any fever, no chills, no passage of melena stools, hematemesis and hematochezia.  She notes that her bowel movements have changed and less bulky than usual.  She denies having any fever, no chills, no headache, no cough, no blurred vision, no focal deficit. Abnormal labs include a lipase of 246, white count 2.7, platelet count 75, glucose 384 CT scan of abdomen and pelvis shows chronic severe hydronephrosis of the LEFT kidney with minimal cortical tissue remaining. Obstructing calculus at the LEFT ureteropelvic junction. No change from 2016. Normal RIGHT kidney. Chronic splenomegaly with increased in volume compared to 2016. Small hiatal hernia. Patient received IV morphine  and IV fluids in the ER and will be admitted for further evaluation.    Review of Systems: As mentioned in the history of present illness. All other systems reviewed and are negative. Past Medical History:  Diagnosis Date   Cancer (HCC) 11/14/2007   Left breast wide excision, sentinel node biopsy. 1.4 cm, T1c, N0: ER 90%; PR 905; Her 2 neu not over expressed.    Diabetes mellitus without  complication (HCC)    GERD (gastroesophageal reflux disease)    Hyperlipidemia    Hypertension    Vitamin D  deficiency    Past Surgical History:  Procedure Laterality Date   ABDOMINAL HYSTERECTOMY  2004   total, due to fibriods. Also had ovaries removed after 1 year of breast cancer.    BREAST LUMPECTOMY Left 2010   CAROTID PTA/STENT INTERVENTION Right 08/04/2023   Procedure: CAROTID PTA/STENT INTERVENTION;  Surgeon: Marea Selinda RAMAN, MD;  Location: [facility] INV[facility]IVE [facility];  Service: Cardiovascular;  Laterality: Right;   COLONOSCOPY     25 yrs ago   COLONOSCOPY  04/16/2010   Dr Ora   Kidney malfunction  Left    Nonfunctional : twisted during 2nd hysterectomy   LOWER EXTREMITY ANGIOGRAPHY Right 03/29/2024   Procedure: Lower Extremity Angiography;  Surgeon: Marea Selinda RAMAN, MD;  Location: [facility] CV [facility];  Service: Cardiovascular;  Laterality: Right;   Social History:  reports that she has been smoking cigarettes. She has never used smokeless tobacco. She reports that she does not drink alcohol and does not use drugs.  No Known Allergies  Family History  Problem Relation Age of Onset   Stroke Mother    Diabetes Mother    Heart disease Father    Heart disease Sister     Prior to Admission medications   Medication Sig Start Date End Date Taking? Authorizing Provider  aspirin  81 MG chewable tablet Chew by mouth daily.    [provider]  Blood Glucose Monitoring Suppl (ONE TOUCH ULTRA 2) w/Device KIT Test blood glucose once  daily. 02/20/23   Bacigalupo, Angela M, MD  cephALEXin  (KEFLEX ) 500 MG capsule Take 1 capsule (500 mg total) by mouth 3 (three) times daily. 03/30/24   Bacigalupo, Angela M, MD  clopidogrel  (PLAVIX ) 75 MG tablet TAKE 1 TABLET(75 MG) BY MOUTH DAILY 02/09/24   Marea Selinda RAMAN, MD  co-enzyme Q-10 30 MG capsule Take 1 capsule (30 mg total) by mouth 3 (three) times daily. 07/08/23   Myrla Jon HERO, MD  fenofibrate  (TRICOR ) 145 MG tablet TAKE 1 TABLET(145 MG) BY  MOUTH DAILY 07/13/24   Bacigalupo, Jon HERO, MD  folic acid  (FOLVITE ) 1 MG tablet Take 1 mg by mouth daily. 02/23/24   [provider]  glucose blood (ONETOUCH ULTRA) test strip Use as instructed 02/20/23   Bacigalupo, Angela M, MD  metFORMIN  (GLUCOPHAGE -XR) 500 MG 24 hr tablet TAKE 1 TABLET(500 MG) BY MOUTH DAILY WITH SUPPER 07/14/24   Bacigalupo, Jon HERO, MD  methotrexate  (RHEUMATREX) 2.5 MG tablet Take 15 mg by mouth once a week. 01/07/23   [provider]  omeprazole  (PRILOSEC) 20 MG capsule TAKE 1 CAPSULE(20 MG) BY MOUTH DAILY 02/29/20   Ashok Kathrine HERO, PA-C  OneTouch Delica Lancets 33G MISC Use as directed 02/20/23   Myrla Jon HERO, MD  rosuvastatin  (CRESTOR ) 5 MG tablet TAKE 1 TABLET(5 MG) BY MOUTH DAILY 07/28/24   Myrla Jon HERO, MD  sertraline  (ZOLOFT ) 100 MG tablet TAKE 1 TABLET(100 MG) BY MOUTH DAILY 07/13/24   Bacigalupo, Jon HERO, MD  Vitamin D , Ergocalciferol , (DRISDOL ) 1.25 MG (50000 UNIT) CAPS capsule TAKE 1 CAPSULE BY MOUTH EVERY 7 DAYS 07/26/24   Myrla Jon HERO, MD    Physical Exam: Vitals:   08/06/24 1026 08/06/24 1508 08/06/24 1540  BP: (!) 144/79 135/63   Pulse: (!) 103 81   Resp: 18 17   Temp: 98 F (36.7 C)  98.2 F (36.8 C)  TempSrc:   Oral  SpO2: 95% 98%   Weight: 74.8 kg    Height: 5' 6 (1.676 m)      Physical Exam Vitals and nursing note reviewed.  Constitutional:      Appearance: She is well-developed.  HENT:     Head: Normocephalic and atraumatic.  Eyes:     Comments: Pale conjunctiva  Cardiovascular:     Rate and Rhythm: Tachycardia present.  Pulmonary:     Effort: Pulmonary effort is normal.     Breath sounds: Normal breath sounds.  Abdominal:     General: Abdomen is protuberant. Bowel sounds are normal.     Palpations: Abdomen is soft.     Tenderness: There is abdominal tenderness in the epigastric area and periumbilical area.  Skin:    General: Skin is warm and dry.  Neurological:     General: No focal deficit  present.     Mental Status: She is alert and oriented to person, place, and time.  Psychiatric:        Mood and Affect: Mood normal.        Behavior: Behavior normal.       Data Reviewed: Data Reviewed: Relevant notes from primary care and specialist visits, past discharge summaries as available in EHR, including Care Everywhere. Prior diagnostic testing as pertinent to current admission diagnoses Updated medications and problem lists for reconciliation ED course, including vitals, labs, imaging, treatment and response to treatment Triage notes, nursing and pharmacy notes and ED provider's notes Notable results as noted in HPI White count 2.7, hemoglobin 11.5, hematocrit 34.5, platelet count 75,  sodium 135, potassium 4.9, chloride 98, bicarb 27, glucose 384, BUN 13, creatinine 0.96, calcium  9.0, total protein 6.6, albumin 3.7, AST 24, ALT 19, alkaline phosphatase 74, total bilirubin 0.6  Labs reviewed  Assessment and Plan: * Acute pancreatitis Presumed to be secondary to hypertriglyceridemia Patient has had elevated triglycerides in the past, in the 800s and is on fenofibrate  Awaiting results of repeat lipase and if over 1000, patient will require an insulin  drip Keep patient n.p.o. Supportive care with IV morphine , IV PPI and antiemetics  Pancytopenia (HCC) Patient noted to have splenomegaly on imaging which could account for her pancytopenia Will monitor closely during this hospitalization Hold Lovenox   Hypertriglyceridemia Continue fenofibrate  pending repeat lipase levels.  Patient may require IV insulin   Type 2 diabetes mellitus with hyperglycemia (HCC) Noted to have significant hyperglycemia in the 300s Aggressive IV fluid resuscitation Place patient on sliding scale insulin  Blood sugar checks every 4 hours  Current tobacco use Smoking cessation has been discussed with patient in detail Will place patient on nicotine  transdermal patch 14 mg daily  GAD (generalized  anxiety disorder) Continue sertraline       Advance Care Planning:   Code Status: Full Code   Consults: None  Family Communication: Plan of care discussed with patient at the bedside.  She verbalizes understanding and agrees with the plan.  All questions and concerns have been addressed.  Severity of Illness: The appropriate patient status for this patient is INPATIENT. Inpatient status is judged to be reasonable and necessary in order to provide the required intensity of service to ensure the patient's safety. The patient's presenting symptoms, physical exam findings, and initial radiographic and laboratory data in the context of their chronic comorbidities is felt to place them at high risk for further clinical deterioration. Furthermore, it is not anticipated that the patient will be medically stable for discharge from the hospital within 2 midnights of admission.   * I certify that at the point of admission it is my clinical judgment that the patient will require inpatient hospital care spanning beyond 2 midnights from the point of admission due to high intensity of service, high risk for further deterioration and high frequency of surveillance required.*  Author: Aimee Somerset, MD 08/06/2024 4:32 PM  For on call review [email or web address].

## 2024-08-06 NOTE — Assessment & Plan Note (Signed)
 Smoking cessation has been discussed with patient in detail Will place patient on nicotine  transdermal patch 14 mg daily

## 2024-08-06 NOTE — Assessment & Plan Note (Signed)
 Patient noted to have splenomegaly on imaging which could account for her pancytopenia Will monitor closely during this hospitalization Hold Lovenox 

## 2024-08-06 NOTE — ED Triage Notes (Signed)
 Pt to ED for abd pain, nausea x4 days,

## 2024-08-06 NOTE — ED Provider Notes (Signed)
 [facility] Provider Note    Event Date/Time   First MD Initiated Contact with Patient 08/06/24 1104     (approximate)   History   Abdominal Pain   HPI  Cassandra Butler is a 58 y.o. female  who presents to the emergency department today because of concern for abdominal pain and nausea. Symptoms have been present for the past week. The patient says that it started in the upper left abdomen but has become more generalized now, however still worse in the upper abdomen. Feels like her stomach is bloated. While she has been nauseous she has not had any actual vomiting. Has had some sweating with this. Has noticed some blood in her stool. Says that prior to the abdominal symptoms she had been dealing with cold like symptoms. She denies any history of abdominal illness, denies abdominal surgeries.     Physical Exam   Triage Vital Signs: ED Triage Vitals [08/06/24 1026]  Encounter Vitals Group     BP (!) 144/79     Girls Systolic BP Percentile      Girls Diastolic BP Percentile      Boys Systolic BP Percentile      Boys Diastolic BP Percentile      Pulse Rate (!) 103     Resp 18     Temp 98 F (36.7 C)     Temp src      SpO2 95 %     Weight 165 lb (74.8 kg)     Height 5' 6 (1.676 m)     Head Circumference      Peak Flow      Pain Score 6     Pain Loc      Pain Education      Exclude from Growth Chart     Most recent vital signs: Vitals:   08/06/24 1026  BP: (!) 144/79  Pulse: (!) 103  Resp: 18  Temp: 98 F (36.7 C)  SpO2: 95%   General: Awake, alert, oriented. CV:  Good peripheral perfusion. Tachycardia. Resp:  Normal effort. Lungs clear. Abd:  Slight distention. No tympany. Tender to palpation in the upper abdomen. No CVA tenderness.  ED Results / Procedures / Treatments   Labs (all labs ordered are listed, but only abnormal results are displayed) Labs Reviewed  COMPREHENSIVE METABOLIC PANEL WITH GFR - Abnormal; Notable for the  following components:      Result Value   Glucose, Bld 384 (*)    All other components within normal limits  LIPASE, BLOOD  CBC  URINALYSIS, ROUTINE W REFLEX MICROSCOPIC     EKG  None   RADIOLOGY I independently interpreted and visualized the CT abd/pel. My interpretation: Thickened gastric wall. No free air Radiology interpretation:  IMPRESSION:  1. Chronic severe hydronephrosis of the LEFT kidney with minimal  cortical tissue remaining. Obstructing calculus at the LEFT  ureteropelvic junction. No change from 2016.  2. Normal RIGHT kidney.  3. Chronic splenomegaly with increased in volume compared to 2016.  4. Small hiatal hernia.      PROCEDURES:  Critical Care performed: No    MEDICATIONS ORDERED IN ED: Medications - No data to display   IMPRESSION / MDM / ASSESSMENT AND PLAN / ED COURSE  I reviewed the triage vital signs and the nursing notes.  Differential diagnosis includes, but is not limited to, obstruction, gastroenteritis, gallbladder disease  Patient's presentation is most consistent with acute presentation with potential threat to life or bodily function.   Patient presented to the emergency department today with concern for abdominal pain and nausea. Afebrile. On exam tender to palpation diffusely in the abdomen, worse in the upper abdomen. Will check blood work, CT scan. Will give IVFs and medication to help with the pain.  CT scan shows known hydronephrosis and atrophy of the left kidney. No concerning acute findings. Blood work notable for elevated lipase and UA is concerning for infection. I do think likely pancreatitis causing the patient's pain. Per chart review has history of significantly elevated triglycerides which could be the cause. I discussed this with the patient. Patient was given multiple pain medications and attempted PO challenge however pain returned. Discussed with Dr. Lanetta with the hospitalist service  who will evaluate for admission.       FINAL CLINICAL IMPRESSION(S) / ED DIAGNOSES   Final diagnoses:  Acute pancreatitis, unspecified complication status, unspecified pancreatitis type  Lower urinary tract infectious disease        Note:  This document was prepared using Dragon voice recognition software and may include unintentional dictation errors.    Floy Roberts, MD 08/06/24 [phone]

## 2024-08-06 NOTE — Assessment & Plan Note (Addendum)
 Presumed to be secondary to hypertriglyceridemia Patient has had elevated triglycerides in the past, in the 800s and is on fenofibrate  Awaiting results of repeat lipase and if over 1000, patient will require IV insulin  Keep patient n.p.o. Supportive care with IV morphine , IV PPI and antiemetics

## 2024-08-06 NOTE — Assessment & Plan Note (Signed)
 Noted to have significant hyperglycemia in the 300s Aggressive IV fluid resuscitation Place patient on sliding scale insulin  Blood sugar checks every 4 hours

## 2024-08-06 NOTE — Assessment & Plan Note (Signed)
 Continue fenofibrate  pending repeat lipase levels.  Patient may require IV insulin 

## 2024-08-06 NOTE — Assessment & Plan Note (Signed)
 Continue sertraline

## 2024-08-07 ENCOUNTER — Other Ambulatory Visit (HOSPITAL_COMMUNITY): Payer: Self-pay

## 2024-08-07 DIAGNOSIS — K858 Other acute pancreatitis without necrosis or infection: Secondary | ICD-10-CM | POA: Diagnosis not present

## 2024-08-07 LAB — HIV ANTIBODY (ROUTINE TESTING W REFLEX): HIV Screen 4th Generation wRfx: NONREACTIVE

## 2024-08-07 LAB — GLUCOSE, CAPILLARY
Glucose-Capillary: 139 mg/dL — ABNORMAL HIGH (ref 70–99)
Glucose-Capillary: 185 mg/dL — ABNORMAL HIGH (ref 70–99)
Glucose-Capillary: 185 mg/dL — ABNORMAL HIGH (ref 70–99)
Glucose-Capillary: 211 mg/dL — ABNORMAL HIGH (ref 70–99)
Glucose-Capillary: 237 mg/dL — ABNORMAL HIGH (ref 70–99)
Glucose-Capillary: 278 mg/dL — ABNORMAL HIGH (ref 70–99)

## 2024-08-07 LAB — BASIC METABOLIC PANEL WITH GFR
Anion gap: 6 (ref 5–15)
BUN: 10 mg/dL (ref 6–20)
CO2: 28 mmol/L (ref 22–32)
Calcium: 8.6 mg/dL — ABNORMAL LOW (ref 8.9–10.3)
Chloride: 104 mmol/L (ref 98–111)
Creatinine, Ser: 0.82 mg/dL (ref 0.44–1.00)
GFR, Estimated: >60 mL/min (ref >60)
Glucose, Bld: 229 mg/dL — ABNORMAL HIGH (ref 70–99)
Potassium: 4.3 mmol/L (ref 3.5–5.1)
Sodium: 138 mmol/L (ref 135–145)

## 2024-08-07 LAB — CBC
HCT: 30.4 % — ABNORMAL LOW (ref 36.0–46.0)
Hemoglobin: 9.9 g/dL — ABNORMAL LOW (ref 12.0–15.0)
MCH: 28.1 pg (ref 26.0–34.0)
MCHC: 32.6 g/dL (ref 30.0–36.0)
MCV: 86.4 fL (ref 80.0–100.0)
Platelets: 69 K/uL — ABNORMAL LOW (ref 150–400)
RBC: 3.52 MIL/uL — ABNORMAL LOW (ref 3.87–5.11)
RDW: 15.7 % — ABNORMAL HIGH (ref 11.5–15.5)
WBC: 2.7 K/uL — ABNORMAL LOW (ref 4.0–10.5)
nRBC: 0 % (ref 0.0–0.2)

## 2024-08-07 LAB — HEMOGLOBIN A1C
Hgb A1c MFr Bld: 11.8 % — ABNORMAL HIGH (ref 4.8–5.6)
Mean Plasma Glucose: 291.96 mg/dL

## 2024-08-07 MED ORDER — INSULIN GLARGINE 100 UNIT/ML ~~LOC~~ SOLN
10.0000 [IU] | Freq: Every day | SUBCUTANEOUS | Status: DC
  Administered 2024-08-07: 10 [IU] via SUBCUTANEOUS
  Filled 2024-08-07 (×2): qty 0.1

## 2024-08-07 MED ORDER — ROSUVASTATIN CALCIUM 10 MG PO TABS
10.0000 mg | ORAL_TABLET | Freq: Every day | ORAL | Status: DC
Start: 2024-08-07 — End: 2024-08-08
  Administered 2024-08-07: 10 mg via ORAL
  Filled 2024-08-07: qty 1

## 2024-08-07 MED ORDER — ROSUVASTATIN CALCIUM 20 MG PO TABS: 20.0000 mg | ORAL_TABLET | Freq: Every day | ORAL | Status: DC

## 2024-08-07 MED ORDER — LIVING WELL WITH DIABETES BOOK
Freq: Once | Status: AC
  Filled 2024-08-07: qty 1

## 2024-08-07 MED ORDER — INSULIN STARTER KIT- PEN NEEDLES (ENGLISH)
1.0000 | Freq: Once | Status: AC
  Administered 2024-08-07: 1
  Filled 2024-08-07: qty 1

## 2024-08-07 NOTE — Inpatient Diabetes Management (Addendum)
 Inpatient Diabetes Program Recommendations  AACE/ADA: New Consensus Statement on Inpatient Glycemic Control  Target Ranges:  Prepandial:   less than 140 mg/dL      Peak postprandial:   less than 180 mg/dL (1-2 hours)      Critically ill patients:  140 - 180 mg/dL    Latest Reference Range & Units 08/06/24 16:17 08/06/24 19:58 08/06/24 22:56 08/06/24 23:59 08/07/24 03:59 08/07/24 07:30 08/07/24 12:09  Glucose-Capillary 70 - 99 mg/dL 799 (H) 832 (H) 854 (H) 185 (H) 211 (H) 185 (H) 139 (H)    Latest Reference Range & Units 08/06/24 10:25  Hemoglobin A1C 4.8 - 5.6 % 11.8 (H)   Review of Glycemic Control  Diabetes history: DM2 Outpatient Diabetes medications: Metformin  500mg  daily  Current orders for Inpatient glycemic control: Lantus 10 units daily, Novolog  0-15 units q4hrs.   Sees Dr Myrla for diabetes management - last seen on 01/16/24. Per OV note: glipizide  was discontinued due to hypoglycemia. Interested in resuming continuous glucose monitoring (Dexcom).   Inpatient Diabetes Program Recommendations: Blood glucose has improved inpatient with current orders.   Spoke with patient via phone about diabetes and home regimen for diabetes control. Patient reports being followed by Dr Bacigalupo for diabetes management and currently taking Glipizide  only. She is hesitant about starting insulin  but understands she may need insulin  for blood sugar control due to current A1C. Inquired about prior A1C and patient reports not being able to recall last A1C value. Discussed A1C results 11.8% and explained that current A1C indicates an average glucose of 292 mg/dl over the past 2-3 months. Discussed glucose and A1C goals. Discussed importance of checking CBGs and maintaining good CBG control to prevent long-term and short-term complications. Explained how hyperglycemia leads to damage within blood vessels which lead to the common complications seen with uncontrolled diabetes. Stressed to the patient the  importance of improving glycemic control to prevent further complications from uncontrolled diabetes. Discussed impact of nutrition, exercise, stress, sickness, and medications on diabetes control.  Discussed carbohydrates, carbohydrate goals per day and meal, along with portion sizes. Encouraged patient to check glucose 4 times per day (before meals and at bedtime) or use Continuous Glucose monitor. Educated patient on insulin  pen use at home. Reviewed all steps of insulin  pen including attachment of needle, 2-unit air shot, dialing up dose, giving injection, removing needle, disposal of sharps, storage of unused insulin , disposal of insulin  etc. MD will need to give patient Rxs for insulin  pens and insulin  pen needles. Patient verbalized understanding of information discussed and reports no further questions at this time related to diabetes.   Spoke with bedside RN via phone. I made her aware patient will possibly be discharged on insulin  and needs ongoing education while she in hospitalized. Requested that bedside RNs educate on insulin  and blood glucose. I also made her aware a Living Well With Diabetes Book and Insulin  Pen Start Kit has been ordered and can be discussed with patient throughout her hospitalization. RN verbalized understanding.   Insulin  Pen Starter Kit and Living Well With Diabetes book ordered.   Per [facility] pharmacy, patient does not have prescription coverage. [facility] pharmacy has Basaglar and Humalog pens if need at discharge available. Patient will need medications from [facility] prior to discharge.   Recommendations at Discharge:  Other recommendations: DELIVER MEDS TO PATIENT FROM [facility] AT DISCHARGE : Annie Purpura [phone]), Humalog Kwikpen [phone]), Insulin  pen needles [phone]), Long acting recommendations: Insulin  Glargine (BASAGLAR) Kwikpen to be determined  Short acting recommendations:  Meal + Correction coverage Insulin  lispro (HUMALOG) KwikPen  Sensitive Scale.  to be  determined Hypoglycemia treatment recommendations: GVoke 1mg  Supply/Referral recommendations: Glucometer Test strips Lancet device Lancets Pen needles - standard   Noted consult for Diabetes Coordinator. Diabetes Coordinator is not on campus over the weekend but available by pager from 8am to 5pm for questions or concerns.    Thanks,  Lavanda Search, RN, MSN, [facility]  Inpatient Diabetes Coordinator  Pager [phone] (8a-5p)

## 2024-08-07 NOTE — Plan of Care (Signed)

## 2024-08-07 NOTE — Progress Notes (Signed)
 Progress Note   Patient: Cassandra Butler FMW:[id number] DOB: 03-Oct-1966 DOA: 08/06/2024     1 DOS: the patient was seen and examined on 08/07/2024   Brief hospital course:  KERIN KREN is a 58 y.o. female with medical history significant for diabetes mellitus on oral agents, hypertension, dyslipidemia, GERD, peripheral arterial disease, history of rheumatoid arthritis, depression, status post right carotid stent who presents to the emergency room for evaluation of a 1 week history of abdominal pain. Pain is mostly in the epigastrium and periumbilical area and is rated a 6 x 10 in intensity at its worst.  It is nonradiating and is associated with nausea but no vomiting.  Pain is aggravated by meals.  She denies having any fever, no chills, no passage of melena stools, hematemesis and hematochezia.  She notes that her bowel movements have changed and less bulky than usual.  She denies having any fever, no chills, no headache, no cough, no blurred vision, no focal deficit. Abnormal labs include a lipase of 246, white count 2.7, platelet count 75, glucose 384 CT scan of abdomen and pelvis shows chronic severe hydronephrosis of the LEFT kidney with minimal cortical tissue remaining. Obstructing calculus at the LEFT ureteropelvic junction. No change from 2016. Normal RIGHT kidney. Chronic splenomegaly with increased in volume compared to 2016. Small hiatal hernia. Patient received IV morphine  and IV fluids in the ER and will be admitted for further evaluation.      Assessment and Plan:  * Acute pancreatitis Secondary to hypertriglyceridemia Triglycerides elevated at greater than 800 Start patient on a clear liquid diet Supportive care with IV morphine , IV PPI and antiemetics   Pancytopenia (HCC) Patient noted to have splenomegaly on imaging which could account for her pancytopenia Will monitor closely during this hospitalization Hold Lovenox    Hypertriglyceridemia Continue fenofibrate    Will start patient on insulin    Type 2 diabetes mellitus with hyperglycemia (HCC) Improved hyperglycemia Hemoglobin A1c is 11 Patient will benefit from insulin  over oral hypoglycemic agents Will start patient on Lantus 10 units daily  Continue sliding scale insulin  Diabetic education    Current tobacco use Smoking cessation was discussed with patient in detail Continue  nicotine  transdermal patch 14 mg daily   GAD (generalized anxiety disorder) Continue sertraline            Subjective: Improved abdominal pain.  No further episodes of nausea  Physical Exam: Vitals:   08/06/24 1844 08/06/24 1956 08/07/24 0357 08/07/24 0737  BP: 135/70 (!) 122/59 (!) 117/59 (!) 123/59  Pulse: 84 90 79 74  Resp: 20   17  Temp:  98.8 F (37.1 C) 98 F (36.7 C) 98.4 F (36.9 C)  TempSrc:      SpO2: 94% 98% (!) 89% 92%  Weight:      Height:       Vitals and nursing note reviewed.  Constitutional:      Appearance: She is well-developed.  HENT:     Head: Normocephalic and atraumatic.  Eyes:     Comments: Pale conjunctiva  Cardiovascular:     Rate and Rhythm: RRR S1,S2  Pulmonary:     Effort: Pulmonary effort is normal.     Breath sounds: Normal breath sounds.  Abdominal:     General: Abdomen is protuberant. Bowel sounds are normal.     Palpations: Abdomen is soft.     Tenderness: There is abdominal tenderness in the epigastric area and periumbilical area.  Skin:    General: Skin is warm  and dry.  Neurological:     General: No focal deficit present.     Mental Status: She is alert and oriented to person, place, and time.  Psychiatric:        Mood and Affect: Mood normal.        Behavior: Behavior normal.      Data Reviewed: Sodium 138, potassium 4.3, chloride 104, bicarb 28, glucose 229, BUN 10, creatinine 0.82, calcium  8.6, hemoglobin 9.9, platelet count 69, white count 2.7 Labs reviewed  Family Communication: Plan of care discussed with patient and spouse at the  bedside.  All questions and concerns have been addressed.  They verbalized understanding and agree with the plan.  Discussed with patient the need to start insulin  to optimize glycemic control  Disposition: Status is: Inpatient Remains inpatient appropriate because: Optimize glycemic control  Planned Discharge Destination: Home    Time spent: 50 minutes  Author: Aimee Somerset, MD 08/07/2024 12:25 PM  For on call review [email or web address].

## 2024-08-08 ENCOUNTER — Other Ambulatory Visit (HOSPITAL_COMMUNITY): Payer: Self-pay

## 2024-08-08 ENCOUNTER — Other Ambulatory Visit: Payer: Self-pay

## 2024-08-08 DIAGNOSIS — K858 Other acute pancreatitis without necrosis or infection: Secondary | ICD-10-CM | POA: Diagnosis not present

## 2024-08-08 LAB — GLUCOSE, CAPILLARY
Glucose-Capillary: 159 mg/dL — ABNORMAL HIGH (ref 70–99)
Glucose-Capillary: 174 mg/dL — ABNORMAL HIGH (ref 70–99)
Glucose-Capillary: 182 mg/dL — ABNORMAL HIGH (ref 70–99)
Glucose-Capillary: 343 mg/dL — ABNORMAL HIGH (ref 70–99)

## 2024-08-08 LAB — MISC LABCORP TEST (SEND OUT): Labcorp test code: 120295

## 2024-08-08 MED ORDER — INSULIN GLARGINE 100 UNIT/ML ~~LOC~~ SOLN
15.0000 [IU] | Freq: Every day | SUBCUTANEOUS | Status: DC
  Administered 2024-08-08: 15 [IU] via SUBCUTANEOUS
  Filled 2024-08-08: qty 0.15

## 2024-08-08 MED ORDER — GVOKE HYPOPEN 2-PACK 1 MG/0.2ML ~~LOC~~ SOAJ
1.0000 mg | SUBCUTANEOUS | 0 refills | Status: DC | PRN
  Filled 2024-08-08: qty 0.4, fill #0

## 2024-08-08 MED ORDER — ROSUVASTATIN CALCIUM 10 MG PO TABS
10.0000 mg | ORAL_TABLET | Freq: Every day | ORAL | 1 refills | Status: DC
  Filled 2024-08-08: qty 30, 30d supply, fill #0

## 2024-08-08 MED ORDER — LANCET DEVICE MISC: 1.0000 | Freq: Three times a day (TID) | 0 refills | Status: AC

## 2024-08-08 MED ORDER — INSULIN PEN NEEDLE 31G X 5 MM MISC
1.0000 | Freq: Three times a day (TID) | 0 refills | Status: AC
  Filled 2024-08-08: qty 100, 34d supply, fill #0

## 2024-08-08 MED ORDER — METFORMIN HCL ER 500 MG PO TB24
1000.0000 mg | ORAL_TABLET | Freq: Every day | ORAL | 0 refills | Status: AC
  Filled 2024-08-08: qty 90, 45d supply, fill #0

## 2024-08-08 MED ORDER — GLUCAGON (RDNA) 1 MG IJ KIT
PACK | INTRAMUSCULAR | 1 refills | Status: AC
  Filled 2024-08-08 (×2): qty 1, 1d supply, fill #0

## 2024-08-08 MED ORDER — BLOOD GLUCOSE TEST VI STRP
1.0000 | ORAL_STRIP | Freq: Three times a day (TID) | 0 refills | Status: AC
  Filled 2024-08-08: qty 100, 34d supply, fill #0

## 2024-08-08 MED ORDER — TRUEPLUS LANCETS 33G MISC
1.0000 | Freq: Three times a day (TID) | 0 refills | Status: AC
  Filled 2024-08-08: qty 100, 34d supply, fill #0

## 2024-08-08 MED ORDER — BASAGLAR KWIKPEN 100 UNIT/ML ~~LOC~~ SOPN
15.0000 [IU] | PEN_INJECTOR | Freq: Every day | SUBCUTANEOUS | 1 refills | Status: DC
  Filled 2024-08-08: qty 3, 20d supply, fill #0

## 2024-08-08 MED ORDER — BLOOD GLUCOSE MONITOR SYSTEM W/DEVICE KIT
1.0000 | PACK | Freq: Three times a day (TID) | 0 refills | Status: AC
  Filled 2024-08-08: qty 1, 1d supply, fill #0

## 2024-08-08 MED ORDER — INSULIN LISPRO (1 UNIT DIAL) 100 UNIT/ML (KWIKPEN)
2.0000 [IU] | PEN_INJECTOR | Freq: Three times a day (TID) | SUBCUTANEOUS | 0 refills | Status: AC
  Filled 2024-08-08: qty 15, 45d supply, fill #0

## 2024-08-08 NOTE — Inpatient Diabetes Management (Signed)
 Inpatient Diabetes Program Recommendations  AACE/ADA: New Consensus Statement on Inpatient Glycemic Control   Target Ranges:  Prepandial:   less than 140 mg/dL      Peak postprandial:   less than 180 mg/dL (1-2 hours)      Critically ill patients:  140 - 180 mg/dL    Latest Reference Range & Units 08/08/24 00:00 08/08/24 03:43 08/08/24 07:35  Glucose-Capillary 70 - 99 mg/dL 817 (H) 840 (H) 825 (H)    Latest Reference Range & Units 08/07/24 07:30 08/07/24 12:09 08/07/24 15:41 08/07/24 20:56  Glucose-Capillary 70 - 99 mg/dL 814 (H) 860 (H) 762 (H) 278 (H)   Review of Glycemic Control  Diabetes history: DM2 Outpatient Diabetes medications: Metformin  500 mg daily Current orders for Inpatient glycemic control: Lantus 10 units daily, Novolog  0-15 units Q4H  Inpatient Diabetes Program Recommendations:    Insulin : Please consider increasing Lantus to 14 units daily.  Outpatient DM: Patient has no RX coverage with insurance and will need to get meds from [facility] at discharge. They have Basaglar and Humalog insulin  pens and are open today 10-2.    Discharge Recommendations: Other recommendations: DELIVER MEDS TO PATIENT FROM [facility] AT DISCHARGE : Annie Purpura [phone]), Humalog Kwikpen [phone]), Insulin  pen needles [phone]), Long acting recommendations: Insulin  Glargine (BASAGLAR) Kwikpen to be determined  Short acting recommendations:  Meal + Correction coverage Insulin  lispro (HUMALOG) KwikPen  Sensitive Scale.  to be determined Hypoglycemia treatment recommendations: GVoke 1mg  Supply/Referral recommendations: Glucometer Test strips Lancet device Lancets Pen needles - standard   Use Adult Diabetes Insulin  Treatment Post Discharge order set.  NOTE: Noted consult for Diabetes Coordinator. Diabetes Coordinator is not on campus over the weekend but available by pager from 8am to 5pm for questions or concerns.   Thanks, Earnie Gainer, RN, MSN, CDCES Diabetes  Coordinator Inpatient Diabetes Program [phone] (Team Pager from 8am to 5pm)

## 2024-08-08 NOTE — Progress Notes (Signed)
 MD order received in [facility] to discharge pt home today; MD escribed Rxs to [facility] whose operating hours are 10a-2p today; discharge being delayed due to pharmacy dispensing Rxs to the pt in the room; to review  AVS with pt once Rxs are delivered; pt verbally made aware with understanding

## 2024-08-08 NOTE — Discharge Summary (Signed)
 Physician Discharge Summary   Patient: Cassandra Butler MRN: [id number] DOB: March 02, 1966  Admit date:     08/06/2024  Discharge date: 08/08/24  Discharge Physician: Anorah Trias   PCP: Myrla Jon HERO, MD   Recommendations at discharge:   Take insulin  as recommended Check blood sugars and keep a log Follow-up with your primary care provider as an outpatient  Discharge Diagnoses: Principal Problem:   Acute pancreatitis Active Problems:   GAD (generalized anxiety disorder)   Current tobacco use   Type 2 diabetes mellitus with hyperglycemia (HCC)   Hypertriglyceridemia   Pancytopenia (HCC)  Resolved Problems:   * No resolved hospital problems. [facility] Hospital Course:  Cassandra Butler is a 58 y.o. female with medical history significant for diabetes mellitus on oral agents, hypertension, dyslipidemia, GERD, peripheral arterial disease, history of rheumatoid arthritis, depression, status post right carotid stent who presents to the emergency room for evaluation of a 1 week history of abdominal pain. Pain is mostly in the epigastrium and periumbilical area and is rated a 6 x 10 in intensity at its worst.  It is nonradiating and is associated with nausea but no vomiting.  Pain is aggravated by meals.  She denies having any fever, no chills, no passage of melena stools, hematemesis and hematochezia.  She notes that her bowel movements have changed and less bulky than usual.  She denies having any fever, no chills, no headache, no cough, no blurred vision, no focal deficit. Abnormal labs include a lipase of 246, white count 2.7, platelet count 75, glucose 384 CT scan of abdomen and pelvis shows chronic severe hydronephrosis of the LEFT kidney with minimal cortical tissue remaining. Obstructing calculus at the LEFT ureteropelvic junction. No change from 2016. Normal RIGHT kidney. Chronic splenomegaly with increased in volume compared to 2016. Small hiatal hernia. Patient received IV  morphine  and IV fluids in the ER and will be admitted for further evaluation..   Assessment and Plan:  * Acute pancreatitis Secondary to hypertriglyceridemia Triglycerides elevated at greater than 800 Improved Patient tolerated a clear liquid diet and advance to regular diet which she tolerated Will discharge home    Pancytopenia [facility]) Patient noted to have splenomegaly on imaging which could account for her pancytopenia Stable    Hypertriglyceridemia Continue fenofibrate  and Insulin     Type 2 diabetes mellitus with hyperglycemia (HCC) Improved hyperglycemia Hemoglobin A1c is 11 Patient will be discharged home on Basaglar 15 units daily and sliding scale coverage with Humalog Advised to keep a log of her blood sugars and take to her PCP     Current tobacco use Smoking cessation was discussed with patient in detail Continue  nicotine  transdermal patch 14 mg daily    GAD (generalized anxiety disorder) Continue sertraline         Consultants: None Procedures performed: None  Disposition: Home Diet recommendation:  Discharge Diet Orders (From admission, onward)     Start     Ordered   08/08/24 0000  Diet Carb Modified        08/08/24 0826           Cardiac and Carb modified diet DISCHARGE MEDICATION: Allergies as of 08/08/2024   No Known Allergies      Medication List     STOP taking these medications    cephALEXin  500 MG capsule Commonly known as: KEFLEX    OneTouch Ultra test strip Generic drug: glucose blood Replaced by: BLOOD GLUCOSE TEST STRIPS Strp       TAKE  these medications    aspirin  81 MG chewable tablet Chew by mouth daily.   Basaglar KwikPen 100 UNIT/ML Inject 15 units into the skin daily   BLOOD GLUCOSE TEST STRIPS Strp Use as directed to check blood sugar 3 (three) times daily. Replaces: OneTouch Ultra test strip   clopidogrel  75 MG tablet Commonly known as: PLAVIX  TAKE 1 TABLET(75 MG) BY MOUTH DAILY   co-enzyme  Q-10 30 MG capsule Take 1 capsule (30 mg total) by mouth 3 (three) times daily.   fenofibrate  145 MG tablet Commonly known as: TRICOR  TAKE 1 TABLET(145 MG) BY MOUTH DAILY   folic acid  1 MG tablet Commonly known as: FOLVITE  Take 1 mg by mouth daily.   glucagon 1 MG injection Follow package directions for low blood sugar.   insulin  lispro 100 UNIT/ML KwikPen Commonly known as: HUMALOG Inject 2 Units into the skin 3 (three) times daily with meals. If eating and Blood Glucose (BG) 80 or higher inject 2 units for meal coverage and add correction dose per scale. If not eating, correction dose only. BG <150= 0 unit; BG 150-200= 1 unit; BG 201-250= 3 unit; BG 251-300= 5 unit; BG 301-350= 7 unit; BG 351-400= 9 unit; BG >400= 11 unit and Call Primary Care.   Insulin  Pen Needle 31G X 5 MM Misc 1 each by Does not apply route 3 (three) times daily.   Lancet Device Misc 1 each by Does not apply route 3 (three) times daily. May dispense any manufacturer covered by patient's insurance.   metFORMIN  500 MG 24 hr tablet Commonly known as: GLUCOPHAGE -XR Take 2 tablets (1,000 mg total) by mouth daily with breakfast. What changed: See the new instructions.   methotrexate  2.5 MG tablet Commonly known as: RHEUMATREX Take 15 mg by mouth once a week.   omeprazole  20 MG capsule Commonly known as: PRILOSEC TAKE 1 CAPSULE(20 MG) BY MOUTH DAILY   ONE TOUCH ULTRA 2 w/Device Kit Test blood glucose once daily. What changed: Another medication with the same name was added. Make sure you understand how and when to take each.   Blood Glucose Monitor System w/Device Kit 1 each by Does not apply route 3 (three) times daily. What changed: You were already taking a medication with the same name, and this prescription was added. Make sure you understand how and when to take each.   OneTouch Delica Lancets 33G Misc Use as directed What changed: Another medication with the same name was added. Make sure you  understand how and when to take each.   TRUEplus Lancets 33G Misc Use as directed to check blood sugar 3 (three) times daily. What changed: You were already taking a medication with the same name, and this prescription was added. Make sure you understand how and when to take each.   rosuvastatin  10 MG tablet Commonly known as: CRESTOR  Take 1 tablet (10 mg total) by mouth at bedtime. What changed:  medication strength See the new instructions.   sertraline  100 MG tablet Commonly known as: ZOLOFT  TAKE 1 TABLET(100 MG) BY MOUTH DAILY   Vitamin D  (Ergocalciferol ) 1.25 MG (50000 UNIT) Caps capsule Commonly known as: DRISDOL  TAKE 1 CAPSULE BY MOUTH EVERY 7 DAYS        Follow-up Information     Myrla Jon HERO, MD Follow up in 1 week(s).   Specialty: Family Medicine Contact information: [address] [place] [address] [phone]  Discharge Exam: Filed Weights   08/06/24 1026  Weight: 74.8 kg   Vitals and nursing note reviewed.  Constitutional:      Appearance: She is well-developed.  HENT:     Head: Normocephalic and atraumatic.  Eyes:     Comments: Pale conjunctiva  Cardiovascular:     Rate and Rhythm: RRR S1,S2  Pulmonary:     Effort: Pulmonary effort is normal.     Breath sounds: Normal breath sounds.  Abdominal:     General: Abdomen is protuberant. Bowel sounds are normal.     Palpations: Abdomen is soft.     Tenderness: Improved abdominal pain.  Skin:    General: Skin is warm and dry.  Neurological:     General: No focal deficit present.     Mental Status: She is alert and oriented to person, place, and time.  Psychiatric:        Mood and Affect: Mood normal.        Behavior: Behavior normal.     Condition at discharge: stable  The results of significant diagnostics from this hospitalization (including imaging, microbiology, ancillary and laboratory) are listed below for reference.   Imaging Studies: CT  ABDOMEN PELVIS W CONTRAST Result Date: 08/06/2024 CLINICAL DATA:  Abdominal pain.  Nausea for 4 days. EXAM: CT ABDOMEN AND PELVIS WITH CONTRAST TECHNIQUE: Multidetector CT imaging of the abdomen and pelvis was performed using the standard protocol following bolus administration of intravenous contrast. RADIATION DOSE REDUCTION: This exam was performed according to the departmental dose-optimization program which includes automated exposure control, adjustment of the mA and/or kV according to patient size and/or use of iterative reconstruction technique. CONTRAST:  OMNIPAQUE  IOHEXOL  300 MG/ML  SOLN COMPARISON:  CT abdomen pelvis 07/27/2015 FINDINGS: Lower chest: Lung bases are clear. Hepatobiliary: No focal hepatic lesion. Normal gallbladder. No biliary duct dilatation. Common bile duct is normal. Pancreas: Pancreas is normal. No ductal dilatation. No pancreatic inflammation. Spleen: Spleen remains enlarged measuring 19.5 cm in craniocaudad dimension compared to 16.5 cm. Adrenals/urinary tract: Adrenal glands are normal. Severe chronic hydronephrosis of the LEFT kidney with minimal cortical tissue remaining. Findings are not changed from 2016. There is an obstructing calculus at the LEFT ureteropelvic junction. The RIGHT kidney appears normal no hydronephrosis or hydroureter. Normal bladder. Stomach/Bowel: Small hiatal hernia. Vascular/Lymphatic: Abdominal aorta normal caliber. Bilateral common iliac stents. No aneurysm. No lymphadenopathy. Reproductive: Post hysterectomy.  Adnexa unremarkable Other: No free fluid. Musculoskeletal: No aggressive osseous lesion. IMPRESSION: 1. Chronic severe hydronephrosis of the LEFT kidney with minimal cortical tissue remaining. Obstructing calculus at the LEFT ureteropelvic junction. No change from 2016. 2. Normal RIGHT kidney. 3. Chronic splenomegaly with increased in volume compared to 2016. 4. Small hiatal hernia. Electronically Signed   By: Jackquline Boxer M.D.   On:  08/06/2024 13:05    Microbiology: Results for orders placed or performed during the hospital encounter of 08/04/23  MRSA Next Gen by PCR, Nasal     Status: None   Collection Time: 08/04/23  4:19 PM   Specimen: Nasal Mucosa; Nasal Swab  Result Value Ref Range Status   MRSA by PCR Next Gen NOT DETECTED NOT DETECTED Final    Comment: (NOTE) The GeneXpert MRSA Assay (FDA approved for NASAL specimens only), is one component of a comprehensive MRSA colonization surveillance program. It is not intended to diagnose MRSA infection nor to guide or monitor treatment for MRSA infections. Test performance is not FDA approved in patients less than 63 years old. Performed  at [facility], [address] [place], [place] [address]     Labs: CBC: Recent Labs  Lab 08/06/24 1025 08/07/24 0524  WBC 2.7* 2.7*  HGB 11.5* 9.9*  HCT 34.5* 30.4*  MCV 85.0 86.4  PLT 75* 69*   Basic Metabolic Panel: Recent Labs  Lab 08/06/24 1025 08/07/24 0524  NA 135 138  K 4.9 4.3  CL 98 104  CO2 27 28  GLUCOSE 384* 229*  BUN 13 10  CREATININE 0.96 0.82  CALCIUM  9.0 8.6*   Liver Function Tests: Recent Labs  Lab 08/06/24 1025  AST 24  ALT 19  ALKPHOS 74  BILITOT 0.6  PROT 6.6  ALBUMIN 3.7   CBG: Recent Labs  Lab 08/07/24 1541 08/07/24 2056 08/08/24 0000 08/08/24 0343 08/08/24 0735  GLUCAP 237* 278* 182* 159* 174*    Discharge time spent: greater than 30 minutes.  Signed: Aimee Somerset, MD [facility] 08/08/2024

## 2024-08-08 NOTE — Progress Notes (Signed)
 Pt's Rx medications were delivered to the her room by [facility]; pt discharged via wheelchair by nursing to the ED parking lot where her personal vehicle is located

## 2024-08-09 ENCOUNTER — Telehealth: Payer: Self-pay | Admitting: *Deleted

## 2024-08-09 LAB — URINE CULTURE: Culture: >=100000 — AB

## 2024-08-09 NOTE — Transitions of Care (Post Inpatient/ED Visit) (Signed)
   08/09/2024  Name: ALEK PONCEDELEON MRN: [id number] DOB: Mar 09, 1966  Today's TOC FU Call Status: Today's TOC FU Call Status:: Unsuccessful Call (1st Attempt) Unsuccessful Call (1st Attempt) Date: 08/09/24  Attempted to reach the patient regarding the most recent Inpatient/ED visit.  Follow Up Plan: Additional outreach attempts will be made to reach the patient to complete the Transitions of Care (Post Inpatient/ED visit) call.   Cathlean Headland BSN RN [facility] Health Care Management Coordinator [email or web address] Direct Dial: [phone]  Fax: [phone] Website: [email or web address]

## 2024-08-10 ENCOUNTER — Telehealth: Payer: Self-pay

## 2024-08-10 NOTE — Transitions of Care (Post Inpatient/ED Visit) (Signed)
   08/10/2024  Name: Cassandra Butler MRN: [id number] DOB: Apr 07, 1966  Today's TOC FU Call Status: Today's TOC FU Call Status:: Unsuccessful Call (2nd Attempt) Unsuccessful Call (2nd Attempt) Date: 08/10/24  Attempted to reach the patient regarding the most recent Inpatient/ED visit.  Follow Up Plan: Additional outreach attempts will be made to reach the patient to complete the Transitions of Care (Post Inpatient/ED visit) call.   Shona Prow RN, CCM [place]  [facility]Population Health RN Care Manager [phone]

## 2024-08-11 ENCOUNTER — Telehealth: Payer: Self-pay

## 2024-08-11 NOTE — Transitions of Care (Post Inpatient/ED Visit) (Signed)
   08/11/2024  Name: MALGORZATA ALBERT MRN: [id number] DOB: 01/19/1966  Today's TOC FU Call Status: Today's TOC FU Call Status:: Unsuccessful Call (3rd Attempt) Unsuccessful Call (3rd Attempt) Date: 08/11/24  Attempted to reach the patient regarding the most recent Inpatient/ED visit.  Follow Up Plan: No further outreach attempts will be made at this time. We have been unable to contact the patient. Left a HIPAA approved voicemail message to phone number provided in demographics per DPR.    Richerd Fish, RN, BSN, CCM [facility], [facility] RN Care Manager Direct Dial: [phone]

## 2024-08-24 ENCOUNTER — Encounter: Payer: Self-pay | Admitting: Family Medicine

## 2024-08-24 ENCOUNTER — Ambulatory Visit (INDEPENDENT_AMBULATORY_CARE_PROVIDER_SITE_OTHER): Payer: PRIVATE HEALTH INSURANCE | Admitting: Family Medicine

## 2024-08-24 VITALS — BP 131/75 | HR 92 | Ht 66.0 in | Wt 160.1 lb

## 2024-08-24 DIAGNOSIS — J441 Chronic obstructive pulmonary disease with (acute) exacerbation: Secondary | ICD-10-CM | POA: Diagnosis not present

## 2024-08-24 DIAGNOSIS — F17210 Nicotine dependence, cigarettes, uncomplicated: Secondary | ICD-10-CM

## 2024-08-24 DIAGNOSIS — E785 Hyperlipidemia, unspecified: Secondary | ICD-10-CM

## 2024-08-24 DIAGNOSIS — K859 Acute pancreatitis without necrosis or infection, unspecified: Secondary | ICD-10-CM

## 2024-08-24 DIAGNOSIS — E1165 Type 2 diabetes mellitus with hyperglycemia: Secondary | ICD-10-CM

## 2024-08-24 DIAGNOSIS — Z794 Long term (current) use of insulin: Secondary | ICD-10-CM

## 2024-08-24 DIAGNOSIS — E1169 Type 2 diabetes mellitus with other specified complication: Secondary | ICD-10-CM

## 2024-08-24 MED ORDER — BASAGLAR KWIKPEN 100 UNIT/ML ~~LOC~~ SOPN: 10.0000 [IU] | PEN_INJECTOR | Freq: Every day | SUBCUTANEOUS | 1 refills | Status: AC

## 2024-08-24 MED ORDER — PREDNISONE 20 MG PO TABS: 40.0000 mg | ORAL_TABLET | Freq: Every day | ORAL | 0 refills | Status: AC

## 2024-08-24 MED ORDER — FREESTYLE LIBRE 3 PLUS SENSOR MISC: 11 refills | Status: AC

## 2024-08-24 MED ORDER — AZITHROMYCIN 250 MG PO TABS: ORAL_TABLET | ORAL | 0 refills | Status: AC

## 2024-08-24 NOTE — Assessment & Plan Note (Signed)
 Poorly controlled diabetes with recent A1c of 11.8%. Recent hospitalization for pancreatitis led to insulin  therapy. Current regimen includes Basaglar and sliding scale insulin , with recent reduction in Basaglar dose. Blood sugars are improving but still elevated, especially in the morning. - Adjust Basaglar to 10 units daily - Discontinue sliding scale insulin  unless needed - Consider continuous glucose monitor (Freestyle [place]) - Send prescription for Basaglar to [facility] - Schedule A1c recheck in late December or early January

## 2024-08-24 NOTE — Progress Notes (Signed)
 Acute visit   Patient: Cassandra Butler   DOB: 04-24-1966   58 y.o. Female  MRN: [id number] PCP: Myrla Jon HERO, MD   Chief Complaint  Patient presents with   Acute Visit    Patient would like to be seen for her cough. She reports she wants to have her cough checked out. She has had cough for 6 weeks.   Cough    Dry cough X 6 weeks. She reports its constant. She takes delsym at night to help her sleep. She reports in hospital 2.5 weeks ago for pancreatitis    Subjective    Discussed the use of AI scribe software for clinical note transcription with the patient, who gave verbal consent to proceed.  History of Present Illness   Cassandra Butler is a 58 year old female with diabetes and recent pancreatitis who presents for follow-up after hospitalization.  She was hospitalized for three days with pancreatitis approximately two weeks and three days ago, experiencing severe abdominal pain. The pain has improved but occasionally recurs after eating, particularly in the upper abdomen. She has been consuming a diet primarily consisting of chicken broth and bland foods to manage her symptoms.  She is on 15 units of long-acting insulin  (Basaglar) daily, self-adjusted to 8 units due to stable blood sugar levels under 150 mg/dL. She has used the sliding scale insulin  only four times since discharge. Her morning blood sugars have been around 160 mg/dL. She continues to take metformin  twice daily and has been using over-the-counter Milk Thistle. Her A1c was previously 11.8%, and she recalls it being as low as 6.3% last year. She reports significant weight loss, now weighing 160 pounds, and has been adhering to dietary changes since her hospitalization.  She has been experiencing a persistent cough for about six weeks, which began with a severe cold. The cough is sometimes productive with dark brown sputum and occurs in prolonged spells several times a day. She reports chest tightness and  wheezing, particularly in the mornings, and has been using Delsym to aid sleep. She continues to smoke.        Review of Systems  Objective    BP 131/75 (BP Location: Left Arm, Patient Position: Sitting, Cuff Size: Normal)   Pulse 92   Ht 5' 6 (1.676 m)   Wt 160 lb 1.6 oz (72.6 kg)   SpO2 100%   BMI 25.84 kg/m  Physical Exam Vitals reviewed.  Constitutional:      General: She is not in acute distress.    Appearance: Normal appearance. She is well-developed. She is not diaphoretic.  HENT:     Head: Normocephalic and atraumatic.  Eyes:     General: No scleral icterus.    Conjunctiva/sclera: Conjunctivae normal.  Neck:     Thyroid: No thyromegaly.  Cardiovascular:     Rate and Rhythm: Normal rate and regular rhythm.     Heart sounds: Normal heart sounds. No murmur heard. Pulmonary:     Effort: Pulmonary effort is normal. No respiratory distress.     Breath sounds: Wheezing and rhonchi present. No rales.  Musculoskeletal:     Cervical back: Neck supple.     Right lower leg: No edema.     Left lower leg: No edema.  Lymphadenopathy:     Cervical: No cervical adenopathy.  Skin:    General: Skin is warm and dry.     Findings: No rash.  Neurological:     Mental Status:  She is alert and oriented to person, place, and time. Mental status is at baseline.  Psychiatric:        Mood and Affect: Mood normal.        Behavior: Behavior normal.       No results found for any visits on 08/24/24.  Assessment & Plan     Problem List Items Addressed This Visit       Digestive   Acute pancreatitis   Recent hospitalization for acute pancreatitis, now improving. Occasional postprandial abdominal pain persists. Dietary modifications include bland foods and chicken broth. Weight loss noted since hospitalization. - Continue dietary modifications with bland foods         Endocrine   Hyperlipidemia associated with type 2 diabetes mellitus (HCC)   Hypertriglyceridemia and  hypercholesterolemia with recent increase in rosuvastatin  dose. Triglycerides are a risk factor for pancreatitis. Current treatment includes rosuvastatin  and fenofibrate . - Continue rosuvastatin  10 mg daily - Continue fenofibrate  - Recheck cholesterol levels with A1c in late December or early January      Relevant Medications   Insulin  Glargine (BASAGLAR KWIKPEN) 100 UNIT/ML   Other Relevant Orders   AMB Referral VBCI Care Management   Type 2 diabetes mellitus with hyperglycemia (HCC) - Primary   Poorly controlled diabetes with recent A1c of 11.8%. Recent hospitalization for pancreatitis led to insulin  therapy. Current regimen includes Basaglar and sliding scale insulin , with recent reduction in Basaglar dose. Blood sugars are improving but still elevated, especially in the morning. - Adjust Basaglar to 10 units daily - Discontinue sliding scale insulin  unless needed - Consider continuous glucose monitor (Freestyle [place]) - Send prescription for Basaglar to [facility] - Schedule A1c recheck in late December or early January      Relevant Medications   Insulin  Glargine (BASAGLAR KWIKPEN) 100 UNIT/ML   Other Relevant Orders   AMB Referral VBCI Care Management   Other Visit Diagnoses       Chronic obstructive pulmonary disease with acute exacerbation (HCC)       Relevant Medications   predniSONE  (DELTASONE ) 20 MG tablet   azithromycin (ZITHROMAX) 250 MG tablet     Cigarette nicotine  dependence without complication               COPD exacerbation Chronic cough with wheezing and productive brown sputum, likely a COPD exacerbation. Symptoms include prolonged cough and chest tightness. No fever reported. Smoking history noted. - Prescribe azithromycin (Z-Pak) for 5 days - Prescribe prednisone  40 mg daily for 5 days - Advise taking prednisone  in the morning with food - Recommend Mucinex  to thin mucus - Send prescriptions to [facility]  Nicotine  dependence Ongoing smoking habit  contributing to respiratory symptoms.      I personally spent a total of 43 minutes in the care of the patient today including preparing to see the patient, getting/reviewing separately obtained history, performing a medically appropriate exam/evaluation, counseling and educating, placing orders, documenting clinical information in the EHR, and coordinating care.   Meds ordered this encounter  Medications   Continuous Glucose Sensor (FREESTYLE LIBRE 3 PLUS SENSOR) MISC    Sig: Change sensor every 15 days.    Dispense:  2 each    Refill:  11   Insulin  Glargine (BASAGLAR KWIKPEN) 100 UNIT/ML    Sig: Inject 10 Units into the skin daily.    Dispense:  5 mL    Refill:  1    May substitute as needed for insurance   predniSONE  (DELTASONE ) 20 MG tablet  Sig: Take 2 tablets (40 mg total) by mouth daily with breakfast for 5 days.    Dispense:  10 tablet    Refill:  0   azithromycin (ZITHROMAX) 250 MG tablet    Sig: Take 500mg  PO daily x1d and then 250mg  daily x4 days    Dispense:  6 each    Refill:  0     Return in about 11 weeks (around 11/09/2024) for chronic disease f/u.      Jon Eva, MD  [facility] [phone] (phone) [phone] (fax)  [facility]

## 2024-08-24 NOTE — Assessment & Plan Note (Signed)
 Recent hospitalization for acute pancreatitis, now improving. Occasional postprandial abdominal pain persists. Dietary modifications include bland foods and chicken broth. Weight loss noted since hospitalization. - Continue dietary modifications with bland foods

## 2024-08-24 NOTE — Assessment & Plan Note (Signed)
 Hypertriglyceridemia and hypercholesterolemia with recent increase in rosuvastatin  dose. Triglycerides are a risk factor for pancreatitis. Current treatment includes rosuvastatin  and fenofibrate . - Continue rosuvastatin  10 mg daily - Continue fenofibrate  - Recheck cholesterol levels with A1c in late December or early January

## 2024-08-25 ENCOUNTER — Telehealth: Payer: Self-pay

## 2024-08-25 NOTE — Progress Notes (Signed)
 Care Guide Pharmacy Note  08/25/2024 Name: [name] MRN: [id number] DOB: 12-01-1965  Referred By: Myrla Jon HERO, MD Reason for referral: Complex Care Management and Call Attempt #1 (Unsuccessful initial outreach to schedule with PHARM D- Allyson )   [name] is a 58 y.o. year old female who is a primary care patient of Bacigalupo, Jon HERO, MD.  [name] Pringle was referred to the pharmacist for assistance related to: DMII  An unsuccessful telephone outreach was attempted today to contact the patient who was referred to the pharmacy team for assistance with Disease management. Additional attempts will be made to contact the patient.  Leotis Rase [facility], [facility] Guide  Direct Dial: [phone]  Fax [phone]

## 2024-08-26 ENCOUNTER — Inpatient Hospital Stay: Payer: PRIVATE HEALTH INSURANCE | Admitting: Family Medicine

## 2024-08-27 NOTE — Progress Notes (Unsigned)
 Care Guide Pharmacy Note  08/27/2024 Name: WINTA BARCELO MRN: [id number] DOB: [birth year]  Referred By: Myrla Jon HERO, MD Reason for referral: Complex Care Management, Call Attempt #1 (Unsuccessful initial outreach to schedule with PHARM D- Allyson ), and Call Attempt #2 (Unsuccessful initial outreach to schedule with Pharm D- Allyson )   QUIANA COBAUGH is a 58 y.o. year old female who is a primary care patient of Myrla, Jon HERO, MD.  [name] Pringle was referred to the pharmacist for assistance related to: DMII  A second unsuccessful telephone outreach was attempted today to contact the patient who was referred to the pharmacy team for assistance with Disease management. Additional attempts will be made to contact the patient.  Leotis Rase [facility], [facility] Guide  Direct Dial: [phone]  Fax [phone]

## 2024-08-30 NOTE — Progress Notes (Signed)
 Care Guide Pharmacy Note  08/30/2024 Name: Cassandra Butler MRN: [id number] DOB: 1966-04-02  Referred By: Myrla Jon HERO, MD Reason for referral: Complex Care Management, Call Attempt #1 (Unsuccessful initial outreach to schedule with PHARM DGLENWOOD Keeling ), Call Attempt #2 (Unsuccessful initial outreach to schedule with Pharm D- Allyson ), and Call Attempt #3 (Unsuccessful initial outreach to schedule with PHARM D- Allyson.)   [name] is a 58 y.o. year old female who is a primary care patient of Myrla, Jon HERO, MD.  [name] Pringle was referred to the pharmacist for assistance related to: DMII  A third unsuccessful telephone outreach was attempted today to contact the patient who was referred to the pharmacy team for assistance with Disease management. The Population Health team is pleased to engage with this patient at any time in the future upon receipt of referral and should he/she be interested in assistance from the [facility] team.  Leotis Rase [facility]  [facility], [facility] Guide  Direct Dial: [phone]  Fax [phone]

## 2024-09-13 ENCOUNTER — Other Ambulatory Visit (INDEPENDENT_AMBULATORY_CARE_PROVIDER_SITE_OTHER): Payer: Self-pay | Admitting: Vascular Surgery

## 2024-10-31 ENCOUNTER — Other Ambulatory Visit: Payer: Self-pay | Admitting: Family Medicine

## 2024-10-31 DIAGNOSIS — E559 Vitamin D deficiency, unspecified: Secondary | ICD-10-CM

## 2024-11-05 ENCOUNTER — Telehealth: Payer: Self-pay

## 2024-11-05 NOTE — Telephone Encounter (Signed)
 Copied from CRM #[id number]. Topic: Clinical - Medication Question >> Nov 05, 2024  7:54 AM Cassandra Butler wrote: Reason for CRM: Thinks pancreatits is coming back, instead of liquid iv in hospital she's requesting an antibiotics. Doing all liquid diet.   [id number]

## 2024-11-05 NOTE — Telephone Encounter (Signed)
 Patient advised and verbalized understanding

## 2024-11-05 NOTE — Telephone Encounter (Signed)
 Pancreatitis is not an infection. Treatment for pancreatitis is clear liquid diet such as gatorade, not antibiotics. If she is having nausea and vomiting or severe abdominal pain then she needs to go to ER.

## 2024-11-22 ENCOUNTER — Ambulatory Visit: Payer: PRIVATE HEALTH INSURANCE | Admitting: Family Medicine

## 2024-12-02 ENCOUNTER — Other Ambulatory Visit: Payer: Self-pay

## 2024-12-02 ENCOUNTER — Telehealth: Payer: Self-pay | Admitting: Family Medicine

## 2024-12-02 MED ORDER — ROSUVASTATIN CALCIUM 10 MG PO TABS: 10.0000 mg | ORAL_TABLET | Freq: Every day | ORAL | 1 refills | Status: AC

## 2024-12-02 NOTE — Telephone Encounter (Signed)
 Refill Request   Pharmacy: [facility]  Medication: rosuvastatin  (CRESTOR ) 10 MG tablet   Quantity (if provided): 90  Please send in refill request.

## 2024-12-02 NOTE — Telephone Encounter (Signed)
 Medication sent
